# Patient Record
Sex: Female | Born: 1995 | Race: White | Hispanic: No | Marital: Married | Smoking: Never smoker
Health system: Southern US, Community
[De-identification: ages and names within clinical notes are randomized; demographics above are authoritative.]

## PROBLEM LIST (undated history)

## (undated) DIAGNOSIS — E119 Type 2 diabetes mellitus without complications: Secondary | ICD-10-CM

## (undated) DIAGNOSIS — E079 Disorder of thyroid, unspecified: Secondary | ICD-10-CM

## (undated) DIAGNOSIS — E039 Hypothyroidism, unspecified: Secondary | ICD-10-CM

## (undated) DIAGNOSIS — J45909 Unspecified asthma, uncomplicated: Secondary | ICD-10-CM

## (undated) DIAGNOSIS — E282 Polycystic ovarian syndrome: Secondary | ICD-10-CM

## (undated) DIAGNOSIS — N912 Amenorrhea, unspecified: Secondary | ICD-10-CM

## (undated) DIAGNOSIS — I10 Essential (primary) hypertension: Secondary | ICD-10-CM

## (undated) DIAGNOSIS — Z87442 Personal history of urinary calculi: Secondary | ICD-10-CM

## (undated) DIAGNOSIS — F419 Anxiety disorder, unspecified: Secondary | ICD-10-CM

## (undated) DIAGNOSIS — N189 Chronic kidney disease, unspecified: Secondary | ICD-10-CM

## (undated) HISTORY — DX: Disorder of thyroid, unspecified: E07.9

## (undated) HISTORY — DX: Hypothyroidism, unspecified: E03.9

## (undated) HISTORY — DX: Amenorrhea, unspecified: N91.2

## (undated) HISTORY — PX: NO PAST SURGERIES: SHX2092

## (undated) HISTORY — DX: Type 2 diabetes mellitus without complications: E11.9

---

## 2016-05-29 ENCOUNTER — Emergency Department
Admission: EM | Admit: 2016-05-29 | Discharge: 2016-05-29 | Disposition: A | Discharge status: Home / Self Care | Payer: Medicaid Other | Attending: Emergency Medicine | Admitting: Emergency Medicine

## 2016-05-29 ENCOUNTER — Encounter: Payer: Self-pay | Admitting: *Deleted

## 2016-05-29 DIAGNOSIS — N912 Amenorrhea, unspecified: Secondary | ICD-10-CM | POA: Diagnosis not present

## 2016-05-29 DIAGNOSIS — R102 Pelvic and perineal pain: Secondary | ICD-10-CM | POA: Insufficient documentation

## 2016-05-29 DIAGNOSIS — M545 Low back pain: Secondary | ICD-10-CM | POA: Diagnosis present

## 2016-05-29 LAB — URINALYSIS COMPLETE WITH MICROSCOPIC (ARMC ONLY)
Bilirubin Urine: NEGATIVE
GLUCOSE, UA: NEGATIVE mg/dL
Hgb urine dipstick: NEGATIVE
KETONES UR: NEGATIVE mg/dL
NITRITE: NEGATIVE
Protein, ur: NEGATIVE mg/dL
SPECIFIC GRAVITY, URINE: 1.02 (ref 1.005–1.030)
pH: 6 (ref 5.0–8.0)

## 2016-05-29 LAB — COMPREHENSIVE METABOLIC PANEL
ALK PHOS: 76 U/L (ref 38–126)
ALT: 30 U/L (ref 14–54)
AST: 26 U/L (ref 15–41)
Albumin: 4.6 g/dL (ref 3.5–5.0)
Anion gap: 7 (ref 5–15)
BILIRUBIN TOTAL: 0.7 mg/dL (ref 0.3–1.2)
BUN: 11 mg/dL (ref 6–20)
CALCIUM: 9.4 mg/dL (ref 8.9–10.3)
CO2: 29 mmol/L (ref 22–32)
CREATININE: 0.65 mg/dL (ref 0.44–1.00)
Chloride: 103 mmol/L (ref 101–111)
GFR calc non Af Amer: >60 mL/min (ref >60)
Glucose, Bld: 102 mg/dL — ABNORMAL HIGH (ref 65–99)
Potassium: 3.9 mmol/L (ref 3.5–5.1)
SODIUM: 139 mmol/L (ref 135–145)
TOTAL PROTEIN: 8 g/dL (ref 6.5–8.1)

## 2016-05-29 LAB — CBC
HCT: 43.9 % (ref 35.0–47.0)
Hemoglobin: 15.1 g/dL (ref 12.0–16.0)
MCH: 30 pg (ref 26.0–34.0)
MCHC: 34.4 g/dL (ref 32.0–36.0)
MCV: 87.3 fL (ref 80.0–100.0)
PLATELETS: 377 10*3/uL (ref 150–440)
RBC: 5.02 MIL/uL (ref 3.80–5.20)
RDW: 12.3 % (ref 11.5–14.5)
WBC: 9.7 10*3/uL (ref 3.6–11.0)

## 2016-05-29 LAB — WET PREP, GENITAL
CLUE CELLS WET PREP: NONE SEEN
SPERM: NONE SEEN
Trich, Wet Prep: NONE SEEN
WBC WET PREP: NONE SEEN
Yeast Wet Prep HPF POC: NONE SEEN

## 2016-05-29 LAB — LIPASE, BLOOD: Lipase: 22 U/L (ref 11–51)

## 2016-05-29 LAB — POC URINE PREG, ED: Preg Test, Ur: NEGATIVE

## 2016-05-29 NOTE — ED Notes (Signed)
Pt reports lower back pain that radiates into lower abd - pain has been present for a "few days" - pt denies vomiting/diarrhea but reports nausea - pt reports constipation for the last couple of weeks

## 2016-05-29 NOTE — ED Triage Notes (Signed)
Pt arrived to ED reporting lower back pain that is radiating around to lower abd. Pt reports having had nausea for over a week. Pt reports she could possibly be pregnant. She is 2 weeks past due on her period but home pregnancy test was negative. Pt reports she has vomited once in the past week. Pt denies having fevers.

## 2016-05-29 NOTE — ED Provider Notes (Signed)
[facility] Emergency Department Provider Note   ____________________________________________   First MD Initiated Contact with Patient 05/29/16 2100     (approximate)  I have reviewed the triage vital signs and the nursing notes.   HISTORY  Chief Complaint Back Pain and Abdominal Pain   HPI Leslie Allison is a 20 y.o. female without any chronic medical conditions was presenting to the emergency department with low back pain which starts in the middle of her back and radiates around to the front to her lower abdomen. She says this has been going on now for 4-5 days. Says that is worse with movement. Denies any burning with urination. Says she has had one episode of vomiting but does not report any nausea or vomiting at this time. Denies any vaginal bleeding or discharge. Denies any history of sexually transmitted diseases. Is curious because she wanted note she was pregnant or not.She says she is 2 weeks late on her period. She denies any strenuous lifting or bending but does say she spends a lot of time on her feet at her job.   History reviewed. No pertinent past medical history.  There are no active problems to display for this patient.   History reviewed. No pertinent surgical history.  Prior to Admission medications   Not on File    Allergies Amoxicillin  History reviewed. No pertinent family history.  Social History Social History  Substance Use Topics  . Smoking status: Never Smoker  . Smokeless tobacco: Never Used  . Alcohol use No    Review of Systems Constitutional: No fever/chills Eyes: No visual changes. ENT: No sore throat. Cardiovascular: Denies chest pain. Respiratory: Denies shortness of breath. Gastrointestinal:   No diarrhea.  No constipation. Genitourinary: Negative for dysuria. Musculoskeletal: As above Skin: Negative for rash. Neurological: Negative for headaches, focal weakness or numbness.  10-point ROS  otherwise negative.  ____________________________________________   PHYSICAL EXAM:  VITAL SIGNS: ED Triage Vitals  Enc Vitals Group     BP 05/29/16 1946 (!) 141/76     Pulse Rate 05/29/16 1946 91     Resp 05/29/16 1946 16     Temp 05/29/16 1946 98 F (36.7 C)     Temp Source 05/29/16 1946 Oral     SpO2 05/29/16 1946 100 %     Weight 05/29/16 1947 270 lb (122.5 kg)     Height 05/29/16 1947 5\' 2"  (1.575 m)     Head Circumference --      Peak Flow --      Pain Score 05/29/16 1947 10     Pain Loc --      Pain Edu? --      Excl. in GC? --     Constitutional: Alert and oriented. Well appearing and in no acute distress. Eyes: Conjunctivae are normal. PERRL. EOMI. Head: Atraumatic. Nose: No congestion/rhinnorhea. Mouth/Throat: Mucous membranes are moist.   Neck: No stridor.   Cardiovascular: Normal rate, regular rhythm. Grossly normal heart sounds.   Respiratory: Normal respiratory effort.  No retractions. Lungs CTAB. Gastrointestinal: Soft With moderate tenderness across the lower abdomen. No distention. No CVA tenderness. Genitourinary: Exam done with female nurse chaperone. Normal external exam. No lesions. Speculum exam with a very small amount of clear discharge. Bimanual exam without any CMT. Mild uterine as well as bilateral adnexal tenderness without any masses palpated.Musculoskeletal: No lower extremity tenderness nor edema.  No joint effusions. Mild tenderness to the L5-S1 area of the low back. There is no  deformity or step-off. No lateral tenderness to the lumbar area. Neurologic:  Normal speech and language. No gross focal neurologic deficits are appreciated. No gait instability. Skin:  Skin is warm, dry and intact. No rash noted. Psychiatric: Mood and affect are normal. Speech and behavior are normal.  ____________________________________________   LABS (all labs ordered are listed, but only abnormal results are displayed)  Labs Reviewed  COMPREHENSIVE METABOLIC  PANEL - Abnormal; Notable for the following:       Result Value   Glucose, Bld 102 (*)    All other components within normal limits  URINALYSIS COMPLETEWITH MICROSCOPIC ([facility] ONLY) - Abnormal; Notable for the following:    Color, Urine YELLOW (*)    APPearance CLEAR (*)    Leukocytes, UA TRACE (*)    Bacteria, UA RARE (*)    Squamous Epithelial / LPF 0-5 (*)    All other components within normal limits  LIPASE, BLOOD  CBC  HCG, QUANTITATIVE, PREGNANCY  POC URINE PREG, ED   ____________________________________________  EKG   ____________________________________________  RADIOLOGY   ____________________________________________   PROCEDURES  Procedure(s) performed:   Procedures  Critical Care performed:   ____________________________________________   INITIAL IMPRESSION / ASSESSMENT AND PLAN / ED COURSE  Pertinent labs & imaging results that were available during my care of the patient were reviewed by me and considered in my medical decision making (see chart for details).  ----------------------------------------- 11:05 PM on 05/29/2016 -----------------------------------------  Patient is resting comfortably and was able to and bili without any noticeable signs of any discomfort or restriction. She says that she was "freaking out" about being late on her period. She says that she is 2 weeks late and was concerned about being pregnant. After being told she had a negative pregnancy test she says that she feels much better including her abdominal and back pain. She denies any dysuria. I'll send her urine for culture but due to her lack of symptoms I will not treat her with antibiotics at this time. She is understanding of the plan and willing to comply. She'll be given follow-up with OB/GYN.  Clinical Course     ____________________________________________   FINAL CLINICAL IMPRESSION(S) / ED DIAGNOSES  Pelvic pain and female.    NEW MEDICATIONS STARTED  DURING THIS VISIT:  New Prescriptions   No medications on file     Note:  This document was prepared using Dragon voice recognition software and may include unintentional dictation errors.    Myrna Blazeravid Matthew Schaevitz, MD 05/29/16 [phone]

## 2016-05-30 LAB — HCG, QUANTITATIVE, PREGNANCY: HCG, BETA CHAIN, QUANT, S: 1 m[IU]/mL (ref <5)

## 2016-05-30 LAB — CHLAMYDIA/NGC RT PCR (ARMC ONLY)
CHLAMYDIA TR: NOT DETECTED
N GONORRHOEAE: NOT DETECTED

## 2016-06-01 LAB — URINE CULTURE

## 2016-06-02 NOTE — Progress Notes (Signed)
ED Culture Results   Allergies: Amoxicillin Visit Date:8/16 Chief Complaint: Back and abdominal pain Culture Type: Urine Culture Results: 100k CFU Ecoli  Original Abx given: None  Original Abx sensitive, intermediate, or resistant: Recommended ZOX:[id number]:Nitrofurantoin 100 mg PO BID  ED Physician: Len BlalockWilliams Contacted Patient: Yes on phone number [phone] [facility](Home); however patient did not answer phone. Left VM with CB number.  Prescription Called into:   Demetrius Charityeldrin D. Kaden Daughdrill, PharmD, BCPS Clinical Pharmacist

## 2016-06-03 NOTE — Progress Notes (Signed)
ED Culture Results   Allergies: Amoxicillin Visit Date:8/16 Chief Complaint: Back and abdominal pain Culture Type: Urine Culture Results: 100k CFU Ecoli  Original Abx given: None  Original Abx sensitive, intermediate, or resistant: Recommended ZOX:[id number]:Nitrofurantoin 100 mg PO BID  ED Physician: Len BlalockWilliams Contacted Patient: Yes on phone number [phone] [facility](Home); Notified patient of Prescription and culture.  [phone]  Leslie BernKelly m Lorin Gawron, [facility] LtdRPH 06/03/2016 1:06 PM

## 2016-07-18 ENCOUNTER — Encounter: Payer: Self-pay | Admitting: Obstetrics and Gynecology

## 2016-07-18 ENCOUNTER — Ambulatory Visit (INDEPENDENT_AMBULATORY_CARE_PROVIDER_SITE_OTHER): Payer: Medicaid Other | Admitting: Obstetrics and Gynecology

## 2016-07-18 VITALS — BP 118/75 | HR 83 | Ht 64.0 in | Wt 278.1 lb

## 2016-07-18 DIAGNOSIS — F79 Unspecified intellectual disabilities: Secondary | ICD-10-CM | POA: Diagnosis not present

## 2016-07-18 DIAGNOSIS — Z803 Family history of malignant neoplasm of breast: Secondary | ICD-10-CM

## 2016-07-18 DIAGNOSIS — Z68.41 Body mass index (BMI) pediatric, greater than or equal to 95th percentile for age: Secondary | ICD-10-CM | POA: Diagnosis not present

## 2016-07-18 DIAGNOSIS — Z6841 Body Mass Index (BMI) 40.0 and over, adult: Secondary | ICD-10-CM

## 2016-07-18 DIAGNOSIS — N912 Amenorrhea, unspecified: Secondary | ICD-10-CM | POA: Diagnosis not present

## 2016-07-18 LAB — POCT URINE PREGNANCY: Preg Test, Ur: NEGATIVE

## 2016-07-18 MED ORDER — MEDROXYPROGESTERONE ACETATE 10 MG PO TABS: 10.0000 mg | ORAL_TABLET | Freq: Every day | ORAL | 0 refills | Status: DC

## 2016-07-18 NOTE — Progress Notes (Signed)
GYN ENCOUNTER NOTE  Subjective:       Leslie Allison is a 20 y.o. [id number] female is here for gynecologic evaluation of the following issues:  1. Amenorrhea.   2. Desires conception  Patient presents with her partner, both mentally challenged. Amenorrhea for 5 months. No symptoms of pregnancy. Home pregnancy test negative. UPT here negative. Patient has had long history of irregular cycles. She has had significant weight gain in past year-35 pounds.     Gynecologic History Patient's last menstrual period was 02/26/2016 (approximate). Contraception: none Last Pap: N/A Last mammogram: N/A  Obstetric History OB History  Gravida Para Term Preterm AB Living  1 0 0 0 1 0  SAB TAB Ectopic Multiple Live Births  1 0 0 0 0    # Outcome Date GA Lbr Len/2nd Weight Sex Delivery Anes PTL Lv  1 SAB               Past Medical History:  Diagnosis Date  . Amenorrhea     Past Surgical History:  Procedure Laterality Date  . NO PAST SURGERIES      No current outpatient prescriptions on file prior to visit.   No current facility-administered medications on file prior to visit.     Allergies  Allergen Reactions  . Amoxicillin     Social History   Social History  . Marital status: Single    Spouse name: N/A  . Number of children: N/A  . Years of education: N/A   Occupational History  . Not on file.   Social History Main Topics  . Smoking status: Never Smoker  . Smokeless tobacco: Never Used  . Alcohol use No  . Drug use: No  . Sexual activity: Yes    Birth control/ protection: None   Other Topics Concern  . Not on file   Social History Narrative  . No narrative on file    Family History  Problem Relation Age of Onset  . Breast cancer Mother   . Ovarian cancer Neg Hx   . Colon cancer Neg Hx   . Diabetes Neg Hx   . Heart disease Neg Hx     The following portions of the patient's history were reviewed and updated as appropriate: allergies, current  medications, past family history, past medical history, past social history, past surgical history and problem list.  Review of Systems Review of Systems - Negative for history of present illness Review of Systems - General ROS: negative for - chills, fatigue, fever, hot flashes, malaise or night sweats Hematological and Lymphatic ROS: negative for - bleeding problems or swollen lymph nodes Gastrointestinal ROS: negative for - abdominal pain, blood in stools, change in bowel habits and nausea/vomiting Musculoskeletal ROS: negative for - joint pain, muscle pain or muscular weakness Genito-Urinary ROS: negative for - change in menstrual cycle, dysmenorrhea, dyspareunia, dysuria, genital discharge, genital ulcers, hematuria, incontinence, irregular/heavy menses, nocturia or pelvic pain  Objective:   BP 118/75   Pulse 83   Ht 5\' 4"  (1.626 m)   Wt 278 lb 1.6 oz (126.1 kg)   LMP 02/26/2016 (Approximate)   BMI 47.74 kg/m  Physical exam-deferred   Assessment:   1. Amenorrhea, Likely secondary to PCO, obesity 3 - POCT urine pregnancy  2. Morbid obesity with BMI of 45.0-49.9, adult (HCC)  3. Mentally challenged  4. Family history of breast cancer in mother     Plan:   1. UPT negative 2. Provera 10 mg a day for 10  days 3. Return in 3 months for follow-up 4. Healthy eating and exercise with controlled weight loss is encouraged 5. Patient is to start on a multivitamin daily in order to reduce risks for spina bifida in offspring  A total of 30 minutes were spent face-to-face with the patient during the encounter with greater than 50% dealing with counseling and coordination of care.  Herold Harms, MD  Note: This dictation was prepared with Dragon dictation along with smaller phrase technology. Any transcriptional errors that result from this process are unintentional.

## 2016-07-18 NOTE — Patient Instructions (Signed)
1. Start a multivitamin daily 2. Take the Provera medicine 1 tablet a day for 10 days. You will have a cycle 1 week after finishing medicine 3. Return in 3 months for follow-up 4. Start healthy eating and exercise with weight loss

## 2016-08-15 ENCOUNTER — Emergency Department: Payer: Medicaid Other

## 2016-08-15 ENCOUNTER — Emergency Department
Admission: EM | Admit: 2016-08-15 | Discharge: 2016-08-15 | Disposition: A | Discharge status: Home / Self Care | Payer: Medicaid Other | Attending: Emergency Medicine | Admitting: Emergency Medicine

## 2016-08-15 DIAGNOSIS — R0789 Other chest pain: Secondary | ICD-10-CM

## 2016-08-15 DIAGNOSIS — R079 Chest pain, unspecified: Secondary | ICD-10-CM | POA: Diagnosis present

## 2016-08-15 LAB — BASIC METABOLIC PANEL
Anion gap: 8 (ref 5–15)
BUN: 13 mg/dL (ref 6–20)
CO2: 28 mmol/L (ref 22–32)
Calcium: 9.3 mg/dL (ref 8.9–10.3)
Chloride: 103 mmol/L (ref 101–111)
Creatinine, Ser: 0.66 mg/dL (ref 0.44–1.00)
Glucose, Bld: 93 mg/dL (ref 65–99)
POTASSIUM: 4 mmol/L (ref 3.5–5.1)
SODIUM: 139 mmol/L (ref 135–145)

## 2016-08-15 LAB — CBC
HEMATOCRIT: 40.5 % (ref 35.0–47.0)
Hemoglobin: 14.3 g/dL (ref 12.0–16.0)
MCH: 30.4 pg (ref 26.0–34.0)
MCHC: 35.3 g/dL (ref 32.0–36.0)
MCV: 86.1 fL (ref 80.0–100.0)
PLATELETS: 336 10*3/uL (ref 150–440)
RBC: 4.71 MIL/uL (ref 3.80–5.20)
RDW: 12.2 % (ref 11.5–14.5)
WBC: 9.1 10*3/uL (ref 3.6–11.0)

## 2016-08-15 LAB — FIBRIN DERIVATIVES D-DIMER (ARMC ONLY): FIBRIN DERIVATIVES D-DIMER (ARMC): 201 (ref 0–499)

## 2016-08-15 LAB — TROPONIN I: Troponin I: <0.03 ng/mL (ref <0.03)

## 2016-08-15 MED ORDER — IBUPROFEN 400 MG PO TABS
600.0000 mg | ORAL_TABLET | Freq: Once | ORAL | Status: AC
  Administered 2016-08-15: 600 mg via ORAL

## 2016-08-15 MED ORDER — IBUPROFEN 600 MG PO TABS
ORAL_TABLET | ORAL | Status: AC
  Administered 2016-08-15: 600 mg via ORAL
  Filled 2016-08-15: qty 1

## 2016-08-15 NOTE — ED Triage Notes (Signed)
Left sided rib pain, sharp that started approx 1h our PTA. Associated with nausea and diaphoresis. Pt alert and oriented X4, active, cooperative, pt in NAD. RR even and unlabored, color WNL.

## 2016-08-15 NOTE — ED Provider Notes (Signed)
[facility] Emergency Department Provider Note  ____________________________________________  Time seen: Approximately 2:30 PM  I have reviewed the triage vital signs and the nursing notes.   HISTORY  Chief Complaint Chest Pain (rib)   HPI Leslie DialsCarrie Allison is a 20 y.o. female with history of amenorrhea and obesity who presents for evaluation of chest pain. Patient reports that she was at school carrying a tray with her food when she developed sudden onset of left-sided sharp chest pain, pleuritic in nature, 10 out of 10, radiating to her left shoulder, constant. She denies shortness of breath but does endorse that she felt diaphoretic and nauseous with onset of the pain. She denies personal or family history of ischemic heart disease, she is unsure if she has a family history of blood clots, she reports she hasn't been of birth control for 4 years, no recent travel immobilization, no leg pain or swelling, no hemoptysis, no exogenous hormones, no cough, no body aches, no fever or chills. No traums.   Past Medical History:  Diagnosis Date  . Amenorrhea     Patient Active Problem List   Diagnosis Date Noted  . Amenorrhea 07/18/2016  . Morbid obesity with BMI of 45.0-49.9, adult (HCC) 07/18/2016  . Mentally challenged 07/18/2016  . Family history of breast cancer in mother 07/18/2016    Past Surgical History:  Procedure Laterality Date  . NO PAST SURGERIES      Prior to Admission medications   Medication Sig Start Date End Date Taking? Authorizing Provider  medroxyPROGESTERone (PROVERA) 10 MG tablet Take 1 tablet (10 mg total) by mouth daily. 07/18/16   Prentice DockerMartin A Defrancesco, MD    Allergies Amoxicillin  Family History  Problem Relation Age of Onset  . Breast cancer Mother   . Ovarian cancer Neg Hx   . Colon cancer Neg Hx   . Diabetes Neg Hx   . Heart disease Neg Hx     Social History Social History  Substance Use Topics  . Smoking status:  Never Smoker  . Smokeless tobacco: Never Used  . Alcohol use No    Review of Systems  Constitutional: Negative for fever. Eyes: Negative for visual changes. ENT: Negative for sore throat. Cardiovascular: + chest pain. Respiratory: Negative for shortness of breath. Gastrointestinal: Negative for abdominal pain, vomiting or diarrhea. Genitourinary: Negative for dysuria. Musculoskeletal: Negative for back pain. Skin: Negative for rash. Neurological: Negative for headaches, weakness or numbness.  ____________________________________________   PHYSICAL EXAM:  VITAL SIGNS: ED Triage Vitals  Enc Vitals Group     BP 08/15/16 1257 (!) 146/73     Pulse Rate 08/15/16 1257 84     Resp 08/15/16 1257 18     Temp 08/15/16 1257 97.8 F (36.6 C)     Temp Source 08/15/16 1257 Oral     SpO2 08/15/16 1257 97 %     Weight 08/15/16 1257 240 lb (108.9 kg)     Height 08/15/16 1257 5\' 4"  (1.626 m)     Head Circumference --      Peak Flow --      Pain Score 08/15/16 1258 9     Pain Loc --      Pain Edu? --      Excl. in GC? --     Constitutional: Alert and oriented. Well appearing and in no apparent distress. HEENT:      Head: Normocephalic and atraumatic.         Eyes: Conjunctivae are normal. Sclera  is non-icteric. EOMI. PERRL      Mouth/Throat: Mucous membranes are moist.       Neck: Supple with no signs of meningismus. Cardiovascular: Regular rate and rhythm. No murmurs, gallops, or rubs. 2+ symmetrical distal pulses are present in all extremities. No JVD. Respiratory: Normal respiratory effort. Lungs are clear to auscultation bilaterally. No wheezes, crackles, or rhonchi. Patient is tender to palpation over the L lower chest wall with no bruising. Gastrointestinal: Soft, non tender, and non distended with positive bowel sounds. No rebound or guarding. Musculoskeletal: Nontender with normal range of motion in all extremities. No edema, cyanosis, or erythema of extremities. Neurologic:  Normal speech and language. Face is symmetric. Moving all extremities. No gross focal neurologic deficits are appreciated. Skin: Skin is warm, dry and intact. No rash noted. Psychiatric: Mood and affect are normal. Speech and behavior are normal.  ____________________________________________   LABS (all labs ordered are listed, but only abnormal results are displayed)  Labs Reviewed  BASIC METABOLIC PANEL  CBC  TROPONIN I  FIBRIN DERIVATIVES D-DIMER ([facility] ONLY)   ____________________________________________  EKG  ED ECG REPORT I, Nita Sicklearolina Clydene Burack, the attending physician, personally viewed and interpreted this ECG. Normal sinus rhythm, rate of 100, normal intervals, normal axis, diffuse T wave flattening on inferior and lateral leads. No prior for comparison ____________________________________________  RADIOLOGY  CXR: Normal ____________________________________________   PROCEDURES  Procedure(s) performed: None Procedures Critical Care performed:  None ____________________________________________   INITIAL IMPRESSION / ASSESSMENT AND PLAN / ED COURSE  20 y.o. female with history of amenorrhea and obesity who presents for evaluation of sudden onset of severe pleuritic left sided chest pain. Pain most likely MSK reproducible on palpation howevere Patient initially tachycardic to 100 therefore unable to rule out PERC. Will check d-dimer. Low suspicion for cardiac (HEART score 0) or other serious etiology (including aortic dissection, pneumonia, pneumothorax, or pulmonary embolism) based her history and physical exam in the ED today. EKG normal. Plan for labs including CBC, chemistries and troponin, CXR and re-evaluation for disposition. Will give motrin for MSK pain. Will observe patient on cardiac monitor while in the ED and pain control.     Clinical Course   _________________________ 2:50 PM on 08/15/2016 ----------------------------------------- Troponin, d-dimer,  cbc, bmp and CXR all WNL. Patient will be discharged home on supportive care and follow up.  Pertinent labs & imaging results that were available during my care of the patient were reviewed by me and considered in my medical decision making (see chart for details).    ____________________________________________   FINAL CLINICAL IMPRESSION(S) / ED DIAGNOSES  Final diagnoses:  Chest wall pain      NEW MEDICATIONS STARTED DURING THIS VISIT:  New Prescriptions   No medications on file     Note:  This document was prepared using Dragon voice recognition software and may include unintentional dictation errors.    Nita Sicklearolina Fionn Stracke, MD 08/15/16 1450

## 2016-10-09 ENCOUNTER — Emergency Department
Admission: EM | Admit: 2016-10-09 | Discharge: 2016-10-09 | Disposition: A | Discharge status: Home / Self Care | Payer: Medicaid Other | Attending: Emergency Medicine | Admitting: Emergency Medicine

## 2016-10-09 ENCOUNTER — Encounter: Payer: Self-pay | Admitting: Emergency Medicine

## 2016-10-09 DIAGNOSIS — J029 Acute pharyngitis, unspecified: Secondary | ICD-10-CM

## 2016-10-09 DIAGNOSIS — J069 Acute upper respiratory infection, unspecified: Secondary | ICD-10-CM | POA: Insufficient documentation

## 2016-10-09 DIAGNOSIS — B9789 Other viral agents as the cause of diseases classified elsewhere: Secondary | ICD-10-CM

## 2016-10-09 LAB — POCT RAPID STREP A: Streptococcus, Group A Screen (Direct): NEGATIVE

## 2016-10-09 MED ORDER — FIRST-DUKES MOUTHWASH MT SUSP: 10.0000 mL | Freq: Four times a day (QID) | OROMUCOSAL | 0 refills | Status: DC

## 2016-10-09 MED ORDER — PSEUDOEPH-BROMPHEN-DM 30-2-10 MG/5ML PO SYRP: 5.0000 mL | ORAL_SOLUTION | Freq: Four times a day (QID) | ORAL | 0 refills | Status: DC | PRN

## 2016-10-09 NOTE — ED Notes (Signed)
See triage note  States she developed sore throat couple of dys ago  Increased pain with swallowing this am

## 2016-10-09 NOTE — ED Provider Notes (Signed)
[facility] Emergency Department Provider Note   ____________________________________________   First MD Initiated Contact with Patient 10/09/16 0825     (approximate)  I have reviewed the triage vital signs and the nursing notes.   HISTORY  Chief Complaint Sore Throat    HPI Leslie Allison is a 20 y.o. female patient complaining of sore throat  sore this morning. Patient denies any fever associated this complaint. Patient also states  has a cough which started yesterday. Patient denies nausea vomiting or diarrhea. No palliative measures taken for this complaint. Patient rates the pain as a 5/10. Patient described a pain as "achy".   Past Medical History:  Diagnosis Date  . Amenorrhea     Patient Active Problem List   Diagnosis Date Noted  . Amenorrhea 07/18/2016  . Morbid obesity with BMI of 45.0-49.9, adult (HCC) 07/18/2016  . Mentally challenged 07/18/2016  . Family history of breast cancer in mother 07/18/2016    Past Surgical History:  Procedure Laterality Date  . NO PAST SURGERIES      Prior to Admission medications   Medication Sig Start Date End Date Taking? Authorizing Provider  brompheniramine-pseudoephedrine-DM 30-2-10 MG/5ML syrup Take 5 mLs by mouth 4 (four) times daily as needed. 10/09/16   Joni Reiningonald K Smith, PA-C  Diphenhyd-Hydrocort-Nystatin (FIRST-DUKES MOUTHWASH) SUSP Use as directed 10 mLs in the mouth or throat 4 (four) times daily. 10/09/16   Joni Reiningonald K Smith, PA-C  medroxyPROGESTERone (PROVERA) 10 MG tablet Take 1 tablet (10 mg total) by mouth daily. 07/18/16   Prentice DockerMartin A Defrancesco, MD    Allergies Amoxicillin  Family History  Problem Relation Age of Onset  . Breast cancer Mother   . Ovarian cancer Neg Hx   . Colon cancer Neg Hx   . Diabetes Neg Hx   . Heart disease Neg Hx     Social History Social History  Substance Use Topics  . Smoking status: Never Smoker  . Smokeless tobacco: Never Used  . Alcohol use No      Review of Systems Constitutional: No fever/chills Eyes: No visual changes. ENT: Sore throat Cardiovascular: Denies chest pain. Respiratory: Denies shortness of breath. Nonproductive cough Gastrointestinal: No abdominal pain.  No nausea, no vomiting.  No diarrhea.  No constipation. Genitourinary: Negative for dysuria. Musculoskeletal: Negative for back pain. Skin: Negative for rash. Neurological: Negative for headaches, focal weakness or numbness. Allergic/Immunilogical: Amoxil __________________________________________   PHYSICAL EXAM:  VITAL SIGNS: ED Triage Vitals  Enc Vitals Group     BP 10/09/16 0756 124/67     Pulse Rate 10/09/16 0756 81     Resp 10/09/16 0756 20     Temp 10/09/16 0756 98 F (36.7 C)     Temp Source 10/09/16 0756 Oral     SpO2 10/09/16 0756 100 %     Weight 10/09/16 0757 240 lb (108.9 kg)     Height --      Head Circumference --      Peak Flow --      Pain Score 10/09/16 0757 5     Pain Loc --      Pain Edu? --      Excl. in GC? --     Constitutional: Alert and oriented. Well appearing and in no acute distress. Eyes: Conjunctivae are normal. PERRL. EOMI. Head: Atraumatic. Nose: No congestion/rhinnorhea. Mouth/Throat: Mucous membranes are moist.  Oropharynx -erythematous. Neck: No stridor.  No cervical spine tenderness to palpation. Hematological/Lymphatic/Immunilogical: No cervical lymphadenopathy. Cardiovascular: Normal rate, regular  rhythm. Grossly normal heart sounds.  Good peripheral circulation. Respiratory: Normal respiratory effort.  No retractions. Lungs CTAB.Nonproductive cough with deep inspirations Gastrointestinal: Soft and nontender. No distention. No abdominal bruits. No CVA tenderness. Musculoskeletal: No lower extremity tenderness nor edema.  No joint effusions. Neurologic:  Normal speech and language. No gross focal neurologic deficits are appreciated. No gait instability. Skin:  Skin is warm, dry and intact. No rash  noted. Psychiatric: Mood and affect are normal. Speech and behavior are normal.  ____________________________________________   LABS (all labs ordered are listed, but only abnormal results are displayed)  Labs Reviewed  CULTURE, GROUP A STREP [facility])  POCT RAPID STREP A   ____________________________________________  EKG   ____________________________________________  RADIOLOGY   ____________________________________________   PROCEDURES  Procedure(s) performed: None  Procedures  Critical Care performed: No  ____________________________________________   INITIAL IMPRESSION / ASSESSMENT AND PLAN / ED COURSE  Pertinent labs & imaging results that were available during my care of the patient were reviewed by me and considered in my medical decision making (see chart for details).  Sore throat with viral respiratory infection. Patient given discharge Instructions. Patient given a prescription would [facility] mouthwash and Bromfed-DM. Patient advised follow-up family doctor condition persists.  Clinical Course    Discussed negative rapid strep test. Patient advised culture is pending.  ____________________________________________   FINAL CLINICAL IMPRESSION(S) / ED DIAGNOSES  Final diagnoses:  Viral URI with cough  Sore throat      NEW MEDICATIONS STARTED DURING THIS VISIT:  New Prescriptions   BROMPHENIRAMINE-PSEUDOEPHEDRINE-DM 30-2-10 MG/5ML SYRUP    Take 5 mLs by mouth 4 (four) times daily as needed.   DIPHENHYD-HYDROCORT-NYSTATIN (FIRST-DUKES MOUTHWASH) SUSP    Use as directed 10 mLs in the mouth or throat 4 (four) times daily.     Note:  This document was prepared using Dragon voice recognition software and may include unintentional dictation errors.    Joni Reiningonald K Smith, PA-C 10/09/16 [id number]    Phineas SemenGraydon Goodman, MD 10/09/16 [phone]

## 2016-10-09 NOTE — ED Triage Notes (Signed)
Pt to ed with c/o sore throat that started this am.  Pt denies fever, reports cough yesterday.

## 2016-10-11 LAB — CULTURE, GROUP A STREP (THRC)

## 2016-10-13 ENCOUNTER — Encounter: Payer: Self-pay | Admitting: Emergency Medicine

## 2016-10-13 ENCOUNTER — Emergency Department
Admission: EM | Admit: 2016-10-13 | Discharge: 2016-10-13 | Disposition: A | Discharge status: Home / Self Care | Payer: Medicaid Other | Attending: Emergency Medicine | Admitting: Emergency Medicine

## 2016-10-13 DIAGNOSIS — R509 Fever, unspecified: Secondary | ICD-10-CM | POA: Diagnosis present

## 2016-10-13 DIAGNOSIS — B349 Viral infection, unspecified: Secondary | ICD-10-CM | POA: Insufficient documentation

## 2016-10-13 DIAGNOSIS — Z79899 Other long term (current) drug therapy: Secondary | ICD-10-CM | POA: Diagnosis not present

## 2016-10-13 LAB — POCT RAPID STREP A: Streptococcus, Group A Screen (Direct): NEGATIVE

## 2016-10-13 MED ORDER — ONDANSETRON 8 MG PO TBDP
8.0000 mg | ORAL_TABLET | Freq: Once | ORAL | Status: AC
  Administered 2016-10-13: 8 mg via ORAL
  Filled 2016-10-13: qty 1

## 2016-10-13 MED ORDER — ONDANSETRON 4 MG PO TBDP: 4.0000 mg | ORAL_TABLET | Freq: Three times a day (TID) | ORAL | 0 refills | Status: DC | PRN

## 2016-10-13 NOTE — ED Provider Notes (Signed)
[facility] Emergency Department Provider Note   ____________________________________________   First MD Initiated Contact with Patient 10/13/16 [phone]     (approximate)  I have reviewed the triage vital signs and the nursing notes.   HISTORY  Chief Complaint Fever   HPI Leslie Allison is a 20 y.o. female is here with complaint of fever. Patient states she took Tylenol at home prior to arrival in the emergency room. She states that there was a lot of vomiting this morning. She denies any diarrhea. She states the highest her temperature is been his 102.9 and then dropped to 101.9. Patient complains of sore throat and coughing this morning as well. Patient denies any pain at this time.   Past Medical History:  Diagnosis Date  . Amenorrhea     Patient Active Problem List   Diagnosis Date Noted  . Amenorrhea 07/18/2016  . Morbid obesity with BMI of 45.0-49.9, adult (HCC) 07/18/2016  . Mentally challenged 07/18/2016  . Family history of breast cancer in mother 07/18/2016    Past Surgical History:  Procedure Laterality Date  . NO PAST SURGERIES      Prior to Admission medications   Medication Sig Start Date End Date Taking? Authorizing Provider  brompheniramine-pseudoephedrine-DM 30-2-10 MG/5ML syrup Take 5 mLs by mouth 4 (four) times daily as needed. 10/09/16   Joni Reiningonald K Smith, PA-C  Diphenhyd-Hydrocort-Nystatin (FIRST-DUKES MOUTHWASH) SUSP Use as directed 10 mLs in the mouth or throat 4 (four) times daily. 10/09/16   Joni Reiningonald K Smith, PA-C  medroxyPROGESTERone (PROVERA) 10 MG tablet Take 1 tablet (10 mg total) by mouth daily. 07/18/16   Prentice DockerMartin A Defrancesco, MD  ondansetron (ZOFRAN ODT) 4 MG disintegrating tablet Take 1 tablet (4 mg total) by mouth every 8 (eight) hours as needed for nausea or vomiting. 10/13/16   Tommi Rumpshonda L Chiamaka Latka, PA-C    Allergies Amoxicillin  Family History  Problem Relation Age of Onset  . Breast cancer Mother   . Ovarian  cancer Neg Hx   . Colon cancer Neg Hx   . Diabetes Neg Hx   . Heart disease Neg Hx     Social History Social History  Substance Use Topics  . Smoking status: Never Smoker  . Smokeless tobacco: Never Used  . Alcohol use No    Review of Systems Constitutional: Positive fever/chills Eyes: No visual changes. ENT: Positive sore throat. Cardiovascular: Denies chest pain. Respiratory: Denies shortness of breath. Positive coughing. Gastrointestinal: No abdominal pain.  Positive nausea, positive vomiting.  No diarrhea.   Genitourinary: Negative for dysuria. Musculoskeletal: Negative for back pain. Skin: Negative for rash. Neurological: Negative for headaches, focal weakness or numbness.  10-point ROS otherwise negative.  ____________________________________________   PHYSICAL EXAM:  VITAL SIGNS: ED Triage Vitals  Enc Vitals Group     BP 10/13/16 0909 128/77     Pulse Rate 10/13/16 0909 (!) 113     Resp 10/13/16 0909 16     Temp 10/13/16 0909 99.6 F (37.6 C)     Temp Source 10/13/16 0909 Oral     SpO2 10/13/16 0909 96 %     Weight 10/13/16 0910 240 lb (108.9 kg)     Height --      Head Circumference --      Peak Flow --      Pain Score 10/13/16 0910 0     Pain Loc --      Pain Edu? --      Excl. in GC? --  Constitutional: Alert and oriented. Well appearing and in no acute distress. Eyes: Conjunctivae are normal. PERRL. EOMI. Head: Atraumatic. Nose: Mild congestion/no rhinnorhea.  EACs and TMs are clear bilaterally. Mouth/Throat: Mucous membranes are moist.  Oropharynx non-erythematous. No exudate is noted and patient is not having any difficulty swallowing her own saliva.  Neck: No stridor.   Hematological/Lymphatic/Immunilogical: Minimal cervical lymphadenopathy. Cardiovascular: Normal rate, regular rhythm. Grossly normal heart sounds.  Good peripheral circulation. Respiratory: Normal respiratory effort.  No retractions. Lungs CTAB. Gastrointestinal: Soft and  nontender. No distention. No abdominal bruits. No CVA tenderness. Musculoskeletal: Moves upper and lower extremities without any difficulty. Normal gait was noted. Neurologic:  Normal speech and language. No gross focal neurologic deficits are appreciated. No gait instability. Skin:  Skin is warm, dry and intact. No rash noted. Psychiatric: Mood and affect are normal. Speech and behavior are normal.  ____________________________________________   LABS (all labs ordered are listed, but only abnormal results are displayed)  Labs Reviewed  POCT RAPID STREP A    PROCEDURES  Procedure(s) performed: None  Procedures  Critical Care performed: No  ____________________________________________   INITIAL IMPRESSION / ASSESSMENT AND PLAN / ED COURSE  Pertinent labs & imaging results that were available during my care of the patient were reviewed by me and considered in my medical decision making (see chart for details).    Clinical Course    Patient was notified that strep test was negative. Patient was given Zofran while in the emergency room there was no continued vomiting. Patient tolerated fluids well. Patient is continued drinking fluids and was given a prescription for Zofran if needed for nausea and vomiting. She [facility] if any continued problems.  ____________________________________________   FINAL CLINICAL IMPRESSION(S) / ED DIAGNOSES  Final diagnoses:  Viral illness      NEW MEDICATIONS STARTED DURING THIS VISIT:  Discharge Medication List as of 10/13/2016 12:34 PM    START taking these medications   Details  ondansetron (ZOFRAN ODT) 4 MG disintegrating tablet Take 1 tablet (4 mg total) by mouth every 8 (eight) hours as needed for nausea or vomiting., Starting Sun 10/13/2016, Print         Note:  This document was prepared using Dragon voice recognition software and may include unintentional dictation errors.    Tommi Rumpshonda L  Makenze Ellett, PA-C 10/13/16 1630    Jene Everyobert Kinner, MD 10/15/16 [phone]

## 2016-10-13 NOTE — ED Triage Notes (Signed)
Patient from home via POV with c/o fever. Patient self administered tylenol at home. Vomiting last night, however none noted this morning. No coughing noted in triage until patient was specifically asked about cough.

## 2016-10-13 NOTE — Discharge Instructions (Signed)
Clear fluids for the next 24 hours. Use Zofran as needed for nausea or vomiting. Tylenol if needed for fever or body aches. [facility] if any continued problems.

## 2016-10-13 NOTE — ED Notes (Signed)
Pt presents to ED with c/o fever. Pt's SO states highest fever of 102.9, then fever dropped to 101.9, on arrival pt's temp 99.6. Pt c/o sore throat and cough this morning, states has been taking OTC tylenol and Ibuprofen for fever. Pt's SO reports 1 episode of "alot of vomiting this morning". Pt is alert and oriented, NAD noted at this time.

## 2016-10-22 ENCOUNTER — Ambulatory Visit: Payer: Medicaid Other | Admitting: Obstetrics and Gynecology

## 2016-11-20 ENCOUNTER — Ambulatory Visit (INDEPENDENT_AMBULATORY_CARE_PROVIDER_SITE_OTHER): Payer: Medicaid Other | Admitting: Obstetrics and Gynecology

## 2016-11-20 ENCOUNTER — Other Ambulatory Visit: Payer: Self-pay | Admitting: Obstetrics and Gynecology

## 2016-11-20 VITALS — BP 112/76 | HR 81 | Ht 64.0 in | Wt 287.2 lb

## 2016-11-20 DIAGNOSIS — R102 Pelvic and perineal pain: Secondary | ICD-10-CM | POA: Diagnosis not present

## 2016-11-20 DIAGNOSIS — N898 Other specified noninflammatory disorders of vagina: Secondary | ICD-10-CM

## 2016-11-20 DIAGNOSIS — N926 Irregular menstruation, unspecified: Secondary | ICD-10-CM

## 2016-11-20 DIAGNOSIS — Z68.41 Body mass index (BMI) pediatric, greater than or equal to 95th percentile for age: Secondary | ICD-10-CM | POA: Diagnosis not present

## 2016-11-20 DIAGNOSIS — Z6841 Body Mass Index (BMI) 40.0 and over, adult: Secondary | ICD-10-CM

## 2016-11-20 NOTE — Progress Notes (Signed)
GYN ENCOUNTER NOTE  Subjective:       Leslie Allison is a 21 y.o. [id number] female is here for gynecologic evaluation of the following issues:  1. Black discharge: pt. c/o black discharge with fishy odor X3 days. Took Provera pill on December 5th. Ensuing painful premenstrual sx of low back pain (that radiates similar to sciatica), tender breasts, abdominal pain & also fatigue. Pt. reports a heavy menses lasted 10/28/16 -11/18/16 (tampon q2 hours). C/o chills & hot flashes this AM.   2. Conception update- been trying for two years. Engaged and getting married this year.  Gynecologic History Patient's last menstrual period was 10/28/2016 (exact date). Contraception: none Hoping to [place] Last Pap: n/a Results were: n/a Last mammogram: n/a, Results were: n/a  Obstetric History OB History  Gravida Para Term Preterm AB Living  1 0 0 0 1 0  SAB TAB Ectopic Multiple Live Births  1 0 0 0 0    # Outcome Date GA Lbr Len/2nd Weight Sex Delivery Anes PTL Lv  1 SAB               Past Medical History:  Diagnosis Date  . Amenorrhea     Past Surgical History:  Procedure Laterality Date  . NO PAST SURGERIES      No current outpatient prescriptions on file prior to visit.   No current facility-administered medications on file prior to visit.     Allergies  Allergen Reactions  . Amoxicillin     Social History   Social History  . Marital status: Single    Spouse name: N/A  . Number of children: N/A  . Years of education: N/A   Occupational History  . Not on file.   Social History Main Topics  . Smoking status: Never Smoker  . Smokeless tobacco: Never Used  . Alcohol use No  . Drug use: No  . Sexual activity: Yes    Birth control/ protection: None   Other Topics Concern  . Not on file   Social History Narrative  . No narrative on file    Family History  Problem Relation Age of Onset  . Breast cancer Mother   . Ovarian cancer Neg Hx   . Colon cancer Neg Hx   .  Diabetes Neg Hx   . Heart disease Neg Hx     The following portions of the patient's history were reviewed and updated as appropriate: allergies, current medications, past family history, past medical history, past social history, past surgical history and problem list.  Review of Systems Review of Systems -  Review of Systems - General ROS:(-) fever or night sweats (+) malaise, fatigue, chills, hot flashes Hematological and Lymphatic ROS: (-) bleeding problems or swollen lymph nodes Gastrointestinal ROS: (+) abdominal pain, (-) blood in stools,(-) change in bowel habits and nausea/vomiting Musculoskeletal ROS: (+) pain that runs from lower back down into foot, similar to sciatic pain path (-) joint pain, muscle pain or muscular weakness Genito-Urinary ROS: negative for (+) dysuria (-) dyspareunia, dysuria, (+) genital discharge, (-) genital ulcers, (-) hematuria, (-) incontinence, (+) irregular/heavy menses, (-) nocturia (+) pelvic pain  Objective:   BP 112/76   Pulse 81   Ht 5\' 4"  (1.626 m)   Wt 287 lb 3.2 oz (130.3 kg)   LMP 10/28/2016 (Exact Date)   BMI 49.30 kg/m  CONSTITUTIONAL: Well-developed, well-nourished female in no acute distress.  HENT:  Normocephalic, atraumatic.  NECK: Normal range of motion, supple, no masses.  Normal thyroid.  SKIN: Skin is warm and dry. No rash noted. Not diaphoretic. No erythema. No pallor. NEUROLGIC: Alert and oriented to person, place, and time. PSYCHIATRIC: Normal mood and affect. Normal behavior. Marginal judgment and thought content. CARDIOVASCULAR:rrr RESPIRATORY: LCAB BREASTS: Not Examined ABDOMEN: Soft, non distended. No Organomegaly. Tender in LRQ and upper left quadrant. No acute peritoneal signs PELVIC:  External Genitalia: Normal  BUS: Normal  Vagina: Normal  Cervix: Normal. White discharge; no lesions  Uterus: Normal size, shape,consistency, mobile. Midline uterus.   Adnexa: Right adnexal fullness, 2/4 tender, without obvious  mass  RV: External exam normal  Bladder: Nontender MUSCULOSKELETAL: Normal range of motion. No tenderness.  No cyanosis, clubbing, or edema.  PROCEDURE: Wet prep Normal saline-no white blood cells; no clue cells; no Trichomonas: Normal epithelial cells KOH-no yeast; whiff test negative   Assessment:   1. Vaginal discharge - white.  2. Wet prep: negative saline, KOH, whiff test 3. Possible right adnexal mass  4. Morbid obesity 5. Pelvic pain, right lower quadrant    Plan:  1. Ultrasound to assess the ovaries. 2. RTC in two weeks  3. Tylenol and ibuprofen when necessary 4. Nu swab  A total of 15 minutes were spent face-to-face with the patient during this encounter and over half of that time dealt with counseling and coordination of care.   Marisue Ivan, PA-S Herold Harms, MD   I have seen, interviewed, and examined the patient in conjunction with the [facility].A. student and affirm the diagnosis and management plan. Nell Gales A. Xavion Muscat, MD, FACOG   Note: This dictation was prepared with Dragon dictation along with smaller phrase technology. Any transcriptional errors that result from this process are unintentional.

## 2016-11-20 NOTE — Patient Instructions (Signed)
1. Ultrasound is scheduled to assess the ovaries 2. Nu swab test is done to assess for vaginitis 3. Return in 2 weeks for follow-up on ultrasound

## 2016-11-21 DIAGNOSIS — N898 Other specified noninflammatory disorders of vagina: Secondary | ICD-10-CM | POA: Insufficient documentation

## 2016-11-22 ENCOUNTER — Ambulatory Visit (INDEPENDENT_AMBULATORY_CARE_PROVIDER_SITE_OTHER): Payer: Medicaid Other

## 2016-11-22 DIAGNOSIS — N926 Irregular menstruation, unspecified: Secondary | ICD-10-CM | POA: Diagnosis not present

## 2016-11-22 LAB — NUSWAB VAGINITIS PLUS (VG+)
Atopobium vaginae: HIGH Score — AB
CHLAMYDIA TRACHOMATIS, NAA: NEGATIVE
Candida albicans, NAA: POSITIVE — AB
Candida glabrata, NAA: NEGATIVE
Neisseria gonorrhoeae, NAA: NEGATIVE
TRICH VAG BY NAA: NEGATIVE

## 2016-11-25 ENCOUNTER — Telehealth: Payer: Self-pay | Admitting: Obstetrics and Gynecology

## 2016-11-25 ENCOUNTER — Telehealth: Payer: Self-pay

## 2016-11-25 MED ORDER — TERCONAZOLE 0.4 % VA CREA: 1.0000 | TOPICAL_CREAM | Freq: Every day | VAGINAL | 0 refills | Status: DC

## 2016-11-25 NOTE — Telephone Encounter (Signed)
-----   Message from Herold HarmsMartin A Defrancesco, MD sent at 11/25/2016  9:45 AM EST ----- Please notify - Abnormal Labs Needs terazol 7 cream daily x 7 days

## 2016-11-25 NOTE — Telephone Encounter (Signed)
Pt came in and she is shaking and wanted to be seen, however I told her we do not do walk in and dr de is not in the office he is in surgery, so sh would like a call back about her shaking.

## 2016-11-25 NOTE — Telephone Encounter (Signed)
Pt aware. Nuswab pos for yeast. Med erx. Pt aware to use at bedtime for 7 nights. Advised no IC until meds are complete.

## 2016-11-26 NOTE — Telephone Encounter (Signed)
Lmtrc

## 2016-11-28 NOTE — Telephone Encounter (Signed)
LMTRC

## 2016-12-02 NOTE — Telephone Encounter (Signed)
Tried to contact pt x 3. LM x 2. Chart closed.

## 2016-12-10 ENCOUNTER — Encounter: Payer: Self-pay | Admitting: Obstetrics and Gynecology

## 2016-12-10 ENCOUNTER — Ambulatory Visit (INDEPENDENT_AMBULATORY_CARE_PROVIDER_SITE_OTHER): Payer: Medicaid Other | Admitting: Obstetrics and Gynecology

## 2016-12-10 VITALS — BP 125/81 | HR 96 | Ht 64.0 in | Wt 284.6 lb

## 2016-12-10 DIAGNOSIS — Z68.41 Body mass index (BMI) pediatric, greater than or equal to 95th percentile for age: Secondary | ICD-10-CM

## 2016-12-10 DIAGNOSIS — N926 Irregular menstruation, unspecified: Secondary | ICD-10-CM | POA: Diagnosis not present

## 2016-12-10 DIAGNOSIS — E282 Polycystic ovarian syndrome: Secondary | ICD-10-CM

## 2016-12-10 DIAGNOSIS — Z6841 Body Mass Index (BMI) 40.0 and over, adult: Secondary | ICD-10-CM

## 2016-12-10 LAB — POCT URINE PREGNANCY: PREG TEST UR: NEGATIVE

## 2016-12-10 MED ORDER — NORETHINDRONE ACET-ETHINYL EST 1-20 MG-MCG PO TABS: 1.0000 | ORAL_TABLET | Freq: Every day | ORAL | 11 refills | Status: DC

## 2016-12-10 MED ORDER — MEDROXYPROGESTERONE ACETATE 10 MG PO TABS: 10.0000 mg | ORAL_TABLET | Freq: Every day | ORAL | 0 refills | Status: DC

## 2016-12-10 NOTE — Progress Notes (Signed)
Chief complaint: 1. Follow-up on ultrasound 2. Oligomenorrhea 3. Dysmenorrhea 4. Morbid obesity  Patient presents for follow-up on pelvic ultrasound. Ultrasound demonstrated normal findings with the exception of the ovaries which showed findings consistent with polycystic ovary disease.  Patient has been given information regarding polycystic ovary disease. She understands that in order to regulate her cycles, make them less painful as well as less heavy, birth control pills will be prescribed. She also understands that losing weight will hopefully facilitate return of normal ovarian function.  ASSESSMENT: 1. PCO; ultrasound supports diagnosis 2. Oligomenorrhea 3. Dysmenorrhea 4. Morbid obesity  PLAN: 1. UPT 2. Provera 10 mg a day for 10 days 3. Begin combined oral contraceptive following onset of menses 4. Maintain menstrual calendar monitoring to assess bleeding 5. Return in 3 months for follow-up  A total of 15 minutes were spent face-to-face with the patient during this encounter and over half of that time dealt with counseling and coordination of care.  Herold HarmsMartin A Allessandra Bernardi, MD  Note: This dictation was prepared with Dragon dictation along with smaller phrase technology. Any transcriptional errors that result from this process are unintentional.

## 2016-12-10 NOTE — Patient Instructions (Signed)
1. Urine pregnancy test is negative today 2. Take Provera 10 mg a day for 10 days 3. When your period starts, begin taking birth control pills 4. Keep track of your menstrual bleeding with menstrual calendar diary 5. Return in 3 months for follow-up

## 2017-03-11 ENCOUNTER — Encounter: Payer: Medicaid Other | Admitting: Obstetrics and Gynecology

## 2017-08-16 ENCOUNTER — Emergency Department
Admission: EM | Admit: 2017-08-16 | Discharge: 2017-08-16 | Disposition: A | Discharge status: Home / Self Care | Payer: Medicaid Other | Attending: Emergency Medicine | Admitting: Emergency Medicine

## 2017-08-16 ENCOUNTER — Encounter: Payer: Self-pay | Admitting: Emergency Medicine

## 2017-08-16 DIAGNOSIS — R05 Cough: Secondary | ICD-10-CM | POA: Insufficient documentation

## 2017-08-16 DIAGNOSIS — R059 Cough, unspecified: Secondary | ICD-10-CM

## 2017-08-16 DIAGNOSIS — Z79899 Other long term (current) drug therapy: Secondary | ICD-10-CM | POA: Insufficient documentation

## 2017-08-16 DIAGNOSIS — F99 Mental disorder, not otherwise specified: Secondary | ICD-10-CM | POA: Diagnosis not present

## 2017-08-16 DIAGNOSIS — N393 Stress incontinence (female) (male): Secondary | ICD-10-CM | POA: Insufficient documentation

## 2017-08-16 DIAGNOSIS — R300 Dysuria: Secondary | ICD-10-CM | POA: Diagnosis present

## 2017-08-16 LAB — PREGNANCY, URINE: Preg Test, Ur: NEGATIVE

## 2017-08-16 LAB — URINALYSIS, COMPLETE (UACMP) WITH MICROSCOPIC
BACTERIA UA: NONE SEEN
BILIRUBIN URINE: NEGATIVE
GLUCOSE, UA: NEGATIVE mg/dL
Hgb urine dipstick: NEGATIVE
KETONES UR: NEGATIVE mg/dL
LEUKOCYTES UA: NEGATIVE
Nitrite: NEGATIVE
PH: 6 (ref 5.0–8.0)
PROTEIN: NEGATIVE mg/dL
RBC / HPF: NONE SEEN RBC/hpf (ref 0–5)
Specific Gravity, Urine: 1.002 — ABNORMAL LOW (ref 1.005–1.030)

## 2017-08-16 LAB — GLUCOSE, CAPILLARY: Glucose-Capillary: 105 mg/dL — ABNORMAL HIGH (ref 65–99)

## 2017-08-16 MED ORDER — METHYLPREDNISOLONE SODIUM SUCC 125 MG IJ SOLR
125.0000 mg | Freq: Once | INTRAMUSCULAR | Status: AC
  Administered 2017-08-16: 125 mg via INTRAVENOUS
  Filled 2017-08-16: qty 2

## 2017-08-16 MED ORDER — PREDNISONE 50 MG PO TABS: ORAL_TABLET | ORAL | 0 refills | Status: DC

## 2017-08-16 MED ORDER — BENZONATATE 100 MG PO CAPS: 100.0000 mg | ORAL_CAPSULE | Freq: Three times a day (TID) | ORAL | 0 refills | Status: AC | PRN

## 2017-08-16 NOTE — ED Notes (Signed)
Pt st. They cannot pee at this time. Pt given water to help produce a urine sample with RN permission

## 2017-08-16 NOTE — ED Notes (Signed)

## 2017-08-16 NOTE — ED Provider Notes (Signed)
[facility] Emergency Department Provider Note  ____________________________________________  Time seen: Approximately 8:44 PM  I have reviewed the triage vital signs and the nursing notes.   HISTORY  Chief Complaint Dysuria    HPI Leslie Allison is a 21 y.o. female presents to the emergency department with increased urinary frequency but denies dysuria and hematuria.  No flank pain.  No fever.  Patient has a nonproductive cough,  Also for the past 4 days.  Patient reports that she experiences increased urinary frequency when she is coughing.Patient states that she has been "spitting more today".  But has experienced no nausea or vomiting.  Patient reports that she has a history of urinary tract infections and her last UTI was approximately 2 months ago.  No changes in vaginal discharge or dyspareunia.  Patient is unsure whether or not she is pregnant.  No alleviating measures have been attempted.   Past Medical History:  Diagnosis Date  . Amenorrhea     Patient Active Problem List   Diagnosis Date Noted  . PCO (polycystic ovaries) 12/10/2016  . Vaginal discharge 11/21/2016  . Amenorrhea 07/18/2016  . Morbid obesity with BMI of 45.0-49.9, adult (HCC) 07/18/2016  . Mentally challenged 07/18/2016  . Family history of breast cancer in mother 07/18/2016    Past Surgical History:  Procedure Laterality Date  . NO PAST SURGERIES      Prior to Admission medications   Medication Sig Start Date End Date Taking? Authorizing Provider  benzonatate (TESSALON PERLES) 100 MG capsule Take 1 capsule (100 mg total) by mouth 3 (three) times daily as needed for cough. 08/16/17 08/23/17  Orvil Feil, PA-C  medroxyPROGESTERone (PROVERA) 10 MG tablet Take 1 tablet (10 mg total) by mouth daily. 12/10/16   Defrancesco, Prentice Docker, MD  norethindrone-ethinyl estradiol (MICROGESTIN) 1-20 MG-MCG tablet Take 1 tablet by mouth daily. 12/10/16   Defrancesco, Prentice Docker, MD  predniSONE  (DELTASONE) 50 MG tablet Take one 50 mg tablet once daily for the next five days. 08/16/17   Orvil Feil, PA-C    Allergies Amoxicillin  Family History  Problem Relation Age of Onset  . Breast cancer Mother   . Ovarian cancer Neg Hx   . Colon cancer Neg Hx   . Diabetes Neg Hx   . Heart disease Neg Hx     Social History Social History  Substance Use Topics  . Smoking status: Never Smoker  . Smokeless tobacco: Never Used  . Alcohol use No     Review of Systems  Constitutional: No fever/chills Eyes: No visual changes. No discharge ENT: No upper respiratory complaints. Cardiovascular: no chest pain. Respiratory: no cough. No SOB. Gastrointestinal: No abdominal pain.  No nausea, no vomiting.  No diarrhea.  No constipation. Genitourinary: Patient has increased urinary frequency. No hematuria Musculoskeletal: Negative for musculoskeletal pain. Skin: Negative for rash, abrasions, lacerations, ecchymosis. Neurological: Negative for headaches, focal weakness or numbness.   ____________________________________________   PHYSICAL EXAM:  VITAL SIGNS: ED Triage Vitals  Enc Vitals Group     BP 08/16/17 1831 (!) 159/89     Pulse Rate 08/16/17 1831 (!) 103     Resp 08/16/17 1831 18     Temp 08/16/17 1831 98.9 F (37.2 C)     Temp Source 08/16/17 1831 Oral     SpO2 08/16/17 1831 100 %     Weight 08/16/17 1833 252 lb (114.3 kg)     Height 08/16/17 1833 5\' 2"  (1.575 m)  Head Circumference --      Peak Flow --      Pain Score --      Pain Loc --      Pain Edu? --      Excl. in GC? --      Constitutional: Alert and oriented. Well appearing and in no acute distress. Eyes: Conjunctivae are normal. PERRL. EOMI. Head: Atraumatic. Cardiovascular: Normal rate, regular rhythm. Normal S1 and S2.  Good peripheral circulation. Respiratory: Normal respiratory effort without tachypnea or retractions. Lungs CTAB. Good air entry to the bases with no decreased or absent breath  sounds. Gastrointestinal: Bowel sounds 4 quadrants. Soft and nontender to palpation. No guarding or rigidity. No palpable masses. No distention. No CVA tenderness. Musculoskeletal: Full range of motion to all extremities. No gross deformities appreciated. Neurologic:  Normal speech and language. No gross focal neurologic deficits are appreciated.  Skin:  Skin is warm, dry and intact. No rash noted. Psychiatric: Mood and affect are normal. Speech and behavior are normal. Patient exhibits appropriate insight and judgement.   ____________________________________________   LABS (all labs ordered are listed, but only abnormal results are displayed)  Labs Reviewed  URINALYSIS, COMPLETE (UACMP) WITH MICROSCOPIC - Abnormal; Notable for the following:       Result Value   Color, Urine STRAW (*)    APPearance CLEAR (*)    Specific Gravity, Urine 1.002 (*)    Squamous Epithelial / LPF 0-5 (*)    All other components within normal limits  GLUCOSE, CAPILLARY - Abnormal; Notable for the following:    Glucose-Capillary 105 (*)    All other components within normal limits  URINE CULTURE  PREGNANCY, URINE  CBG MONITORING, ED   ____________________________________________  EKG   ____________________________________________  RADIOLOGY   No results found.  ____________________________________________    PROCEDURES  Procedure(s) performed:    Procedures    Medications  methylPREDNISolone sodium succinate (SOLU-MEDROL) 125 mg/2 mL injection 125 mg (125 mg Intravenous Given 08/16/17 2231)     ____________________________________________   INITIAL IMPRESSION / ASSESSMENT AND PLAN / ED COURSE  Pertinent labs & imaging results that were available during my care of the patient were reviewed by me and considered in my medical decision making (see chart for details).  Review of the [place] CSRS was performed in accordance of the [facility] prior to dispensing any controlled drugs.      Assessment and plan Stress incontinence Patient presents to the emergency department with increased urinary frequency after cough.  Patient denies a history of diabetes. Serum glucose levels were reassuring in the emergency department.  Urinalysis was also reassuring.  Was prescribed prednisone and Tessalon Perles for cough.  Vital signs were reassuring prior to discharge.  All patient questions were answered.   ____________________________________________  FINAL CLINICAL IMPRESSION(S) / ED DIAGNOSES  Final diagnoses:  Stress incontinence  Cough      NEW MEDICATIONS STARTED DURING THIS VISIT:  New Prescriptions   BENZONATATE (TESSALON PERLES) 100 MG CAPSULE    Take 1 capsule (100 mg total) by mouth 3 (three) times daily as needed for cough.   PREDNISONE (DELTASONE) 50 MG TABLET    Take one 50 mg tablet once daily for the next five days.        This chart was dictated using voice recognition software/Dragon. Despite best efforts to proofread, errors can occur which can change the meaning. Any change was purely unintentional.    Orvil FeilWoods, Izear Pine M, PA-C 08/16/17 2243    Mc[place],  Rudy Jew, MD 08/16/17 [phone]

## 2017-08-16 NOTE — ED Triage Notes (Signed)
States frequent urination x 4 days. Denies pain. Attempt to obtain urine spec in triage but missed cup.

## 2017-08-18 LAB — URINE CULTURE

## 2017-11-18 ENCOUNTER — Other Ambulatory Visit: Payer: Self-pay

## 2017-11-18 ENCOUNTER — Emergency Department
Admission: EM | Admit: 2017-11-18 | Discharge: 2017-11-18 | Disposition: A | Discharge status: Home / Self Care | Payer: Medicaid Other | Attending: Emergency Medicine | Admitting: Emergency Medicine

## 2017-11-18 DIAGNOSIS — N3001 Acute cystitis with hematuria: Secondary | ICD-10-CM | POA: Insufficient documentation

## 2017-11-18 DIAGNOSIS — R1030 Lower abdominal pain, unspecified: Secondary | ICD-10-CM | POA: Diagnosis present

## 2017-11-18 DIAGNOSIS — J45909 Unspecified asthma, uncomplicated: Secondary | ICD-10-CM | POA: Insufficient documentation

## 2017-11-18 HISTORY — DX: Unspecified asthma, uncomplicated: J45.909

## 2017-11-18 LAB — URINALYSIS, COMPLETE (UACMP) WITH MICROSCOPIC
BILIRUBIN URINE: NEGATIVE
GLUCOSE, UA: NEGATIVE mg/dL
Ketones, ur: NEGATIVE mg/dL
LEUKOCYTES UA: NEGATIVE
NITRITE: NEGATIVE
PROTEIN: NEGATIVE mg/dL
SPECIFIC GRAVITY, URINE: 1.018 (ref 1.005–1.030)
pH: 5 (ref 5.0–8.0)

## 2017-11-18 LAB — CBC
HCT: 41.9 % (ref 35.0–47.0)
HEMOGLOBIN: 14.4 g/dL (ref 12.0–16.0)
MCH: 29.5 pg (ref 26.0–34.0)
MCHC: 34.3 g/dL (ref 32.0–36.0)
MCV: 86 fL (ref 80.0–100.0)
Platelets: 291 10*3/uL (ref 150–440)
RBC: 4.87 MIL/uL (ref 3.80–5.20)
RDW: 12.7 % (ref 11.5–14.5)
WBC: 7.7 10*3/uL (ref 3.6–11.0)

## 2017-11-18 LAB — COMPREHENSIVE METABOLIC PANEL
ALK PHOS: 63 U/L (ref 38–126)
ALT: 63 U/L — ABNORMAL HIGH (ref 14–54)
ANION GAP: 7 (ref 5–15)
AST: 41 U/L (ref 15–41)
Albumin: 4.3 g/dL (ref 3.5–5.0)
BILIRUBIN TOTAL: 0.9 mg/dL (ref 0.3–1.2)
BUN: 13 mg/dL (ref 6–20)
CALCIUM: 9.2 mg/dL (ref 8.9–10.3)
CO2: 30 mmol/L (ref 22–32)
Chloride: 102 mmol/L (ref 101–111)
Creatinine, Ser: 0.76 mg/dL (ref 0.44–1.00)
Glucose, Bld: 118 mg/dL — ABNORMAL HIGH (ref 65–99)
POTASSIUM: 3.7 mmol/L (ref 3.5–5.1)
Sodium: 139 mmol/L (ref 135–145)
TOTAL PROTEIN: 7.4 g/dL (ref 6.5–8.1)

## 2017-11-18 LAB — LIPASE, BLOOD: Lipase: 30 U/L (ref 11–51)

## 2017-11-18 LAB — POCT PREGNANCY, URINE: Preg Test, Ur: NEGATIVE

## 2017-11-18 MED ORDER — ONDANSETRON 4 MG PO TBDP
4.0000 mg | ORAL_TABLET | Freq: Once | ORAL | Status: AC
  Administered 2017-11-18: 4 mg via ORAL
  Filled 2017-11-18: qty 1

## 2017-11-18 MED ORDER — IBUPROFEN 600 MG PO TABS
600.0000 mg | ORAL_TABLET | Freq: Once | ORAL | Status: AC
  Administered 2017-11-18: 600 mg via ORAL
  Filled 2017-11-18: qty 1

## 2017-11-18 MED ORDER — ONDANSETRON HCL 4 MG PO TABS: 4.0000 mg | ORAL_TABLET | Freq: Three times a day (TID) | ORAL | 0 refills | Status: DC | PRN

## 2017-11-18 MED ORDER — CEPHALEXIN 500 MG PO CAPS
500.0000 mg | ORAL_CAPSULE | Freq: Once | ORAL | Status: AC
  Administered 2017-11-18: 500 mg via ORAL
  Filled 2017-11-18: qty 1

## 2017-11-18 MED ORDER — CEPHALEXIN 500 MG PO CAPS: 500.0000 mg | ORAL_CAPSULE | Freq: Four times a day (QID) | ORAL | 0 refills | Status: AC

## 2017-11-18 NOTE — Discharge Instructions (Signed)
You have been seen in the Emergency Department (ED)  today for a urinary tract infection.  Most UTIs are caused by bacteria and need to be treated with antibiotics. It is important to complete your treatment so that the infection does not get worse. Take your antibiotics fully even if your symptoms start to get better after the first few doses. Drink PLENTY of fluids to help clear the infection.  For pain take 600mg  of ibuprofen every 6 hours.  Follow-up with your doctor or return to the ER immediately if your symptoms are getting worse, if you develop a fever, if you develop abdominal or flank pain, or if you start to vomit. Otherwise follow up with your doctor in 1 week if your symptoms are improving.   When should you call for help?  Call your doctor now or seek immediate medical care if:  Symptoms such as a fever, chills, nausea, or vomiting get worse or happen for the first time.  You have new pain in your back just below your rib cage. This is called flank pain.  There is new blood or pus in your urine.  You are not able to take or keep down your antibiotics. Your symptoms are not getting better after 48 hours of antibiotic treatment  Watch closely for changes in your health, and be sure to contact your doctor if:  You are not getting better after taking an antibiotic for 2 days.  Your symptoms go away but then come back.   How can you care for yourself at home?  Take your antibiotics as prescribed. Do not stop taking them just because you feel better. You need to take the full course of antibiotics.  Take your medicines exactly as prescribed. Your doctor may have prescribed a medicine, such as phenazopyridine (Pyridium), to help relieve pain when you urinate. This turns your urine orange. You may stop taking it when your symptoms get better. But be sure to take all of your antibiotics, which treat the infection.  Drink extra water and juices such as cranberry and blueberry juices for  the next day or two. This will help make the urine less concentrated and help wash out the bacteria causing the infection. (If you have kidney, heart, or liver disease and have to limit your fluids, talk with your doctor before you increase your fluid intake.)  Avoid drinks that are carbonated or have caffeine. They can irritate the bladder.  Urinate often. Try to empty your bladder each time.  To relieve pain, take a hot bath or lay a heating pad (set on low) over your lower belly or genital area. Never go to sleep with a heating pad in place.  To help prevent UTIs  Drink plenty of fluids, enough so that your urine is light yellow or clear like water. If you have kidney, heart, or liver disease and have to limit fluids, talk with your doctor before you increase the amount of fluids you drink.  Urinate when you have the urge. Do not hold your urine for a long time. Urinate before you go to sleep.  Keep your vagina/ penis clean.

## 2017-11-18 NOTE — ED Provider Notes (Signed)
[facility] Emergency Department Provider Note  ____________________________________________  Time seen: Approximately 3:43 PM  I have reviewed the triage vital signs and the nursing notes.   HISTORY  Chief Complaint Abdominal Pain   HPI Leslie Allison is a 22 y.o. female with a history of PCOS, mentally challenged, obesity who presents for evaluation of abdominal pain. Patient reports that she has had the pain for 3 days. The pain is located in the suprapubic region and radiating to her back. The pain is sharp and only present when she stands up and walks. She has no pain while sitting down or at rest. She also reports that her both feet hurt when she walks and that has been going on for the same amount of time. She reports that she feels like there is swelling on the side of her feet. She denies any prior abdominal surgeries. She has no pain at this time. She also endorses urinary frequency and nausea that has been ongoing for 3 days. No dysuria, no hematuria, no vaginal discharge, no vomiting. LMP was 2 months ago. No constipation or diarrhea.  Past Medical History:  Diagnosis Date  . Amenorrhea   . Asthma     Patient Active Problem List   Diagnosis Date Noted  . PCO (polycystic ovaries) 12/10/2016  . Vaginal discharge 11/21/2016  . Amenorrhea 07/18/2016  . Morbid obesity with BMI of 45.0-49.9, adult (HCC) 07/18/2016  . Mentally challenged 07/18/2016  . Family history of breast cancer in mother 07/18/2016    Past Surgical History:  Procedure Laterality Date  . NO PAST SURGERIES      Prior to Admission medications   Medication Sig Start Date End Date Taking? Authorizing Provider  cephALEXin (KEFLEX) 500 MG capsule Take 1 capsule (500 mg total) by mouth 4 (four) times daily for 10 days. 11/18/17 11/28/17  Nita Sickle, MD  medroxyPROGESTERone (PROVERA) 10 MG tablet Take 1 tablet (10 mg total) by mouth daily. Patient not taking: Reported on  11/18/2017 12/10/16   Defrancesco, Prentice Docker, MD  norethindrone-ethinyl estradiol (MICROGESTIN) 1-20 MG-MCG tablet Take 1 tablet by mouth daily. Patient not taking: Reported on 11/18/2017 12/10/16   Defrancesco, Prentice Docker, MD  ondansetron (ZOFRAN) 4 MG tablet Take 1 tablet (4 mg total) by mouth every 8 (eight) hours as needed for nausea or vomiting. 11/18/17   Nita Sickle, MD  predniSONE (DELTASONE) 50 MG tablet Take one 50 mg tablet once daily for the next five days. Patient not taking: Reported on 11/18/2017 08/16/17   Orvil Feil, PA-C    Allergies Amoxicillin  Family History  Problem Relation Age of Onset  . Breast cancer Mother   . Ovarian cancer Neg Hx   . Colon cancer Neg Hx   . Diabetes Neg Hx   . Heart disease Neg Hx     Social History Social History   Tobacco Use  . Smoking status: Never Smoker  . Smokeless tobacco: Never Used  Substance Use Topics  . Alcohol use: No  . Drug use: No    Review of Systems  Constitutional: Negative for fever. Eyes: Negative for visual changes. ENT: Negative for sore throat. Neck: No neck pain  Cardiovascular: Negative for chest pain. Respiratory: Negative for shortness of breath. Gastrointestinal: + suprapubic abdominal pain and nausea. No vomiting or diarrhea. Genitourinary: Negative for dysuria. + frequency Musculoskeletal: Negative for back pain. Skin: Negative for rash. Neurological: Negative for headaches, weakness or numbness. Psych: No SI or HI  ____________________________________________  PHYSICAL EXAM:  VITAL SIGNS: ED Triage Vitals [11/18/17 1356]  Enc Vitals Group     BP (!) 154/102     Pulse Rate (!) 103     Resp 17     Temp 99.3 F (37.4 C)     Temp Source Oral     SpO2 97 %     Weight (!) 336 lb 14.4 oz (152.8 kg)     Height 5\' 4"  (1.626 m)     Head Circumference      Peak Flow      Pain Score 9     Pain Loc      Pain Edu?      Excl. in GC?     Constitutional: Alert and oriented. Well  appearing and in no apparent distress. HEENT:      Head: Normocephalic and atraumatic.         Eyes: Conjunctivae are normal. Sclera is non-icteric.       Mouth/Throat: Mucous membranes are moist.       Neck: Supple with no signs of meningismus. Cardiovascular: Regular rate and rhythm. No murmurs, gallops, or rubs. 2+ symmetrical distal pulses are present in all extremities. No JVD. Respiratory: Normal respiratory effort. Lungs are clear to auscultation bilaterally. No wheezes, crackles, or rhonchi.  Gastrointestinal: Morbidly obese, soft, mild suprapubic ttp, no RLQ or RUQ ttp, and non distended with positive bowel sounds. No rebound or guarding. Genitourinary: No CVA tenderness. Musculoskeletal: Nontender with normal range of motion in all extremities. No edema, cyanosis, or erythema of extremities. Neurologic: Normal speech and language. Face is symmetric. Moving all extremities. No gross focal neurologic deficits are appreciated. Skin: Skin is warm, dry and intact. No rash noted. Psychiatric: Mood and affect are normal. Speech and behavior are normal.  ____________________________________________   LABS (all labs ordered are listed, but only abnormal results are displayed)  Labs Reviewed  COMPREHENSIVE METABOLIC PANEL - Abnormal; Notable for the following components:      Result Value   Glucose, Bld 118 (*)    ALT 63 (*)    All other components within normal limits  URINALYSIS, COMPLETE (UACMP) WITH MICROSCOPIC - Abnormal; Notable for the following components:   Color, Urine YELLOW (*)    APPearance HAZY (*)    Hgb urine dipstick SMALL (*)    Bacteria, UA RARE (*)    Squamous Epithelial / LPF 0-5 (*)    All other components within normal limits  URINE CULTURE  LIPASE, BLOOD  CBC  POC URINE PREG, ED  POCT PREGNANCY, URINE   ____________________________________________  EKG  none   ____________________________________________  RADIOLOGY  none ____________________________________________   PROCEDURES  Procedure(s) performed: None Procedures Critical Care performed:  None ____________________________________________   INITIAL IMPRESSION / ASSESSMENT AND PLAN / ED COURSE   22 y.o. female with a history of PCOS, mentally challenged, obesity who presents for evaluation of 3 days of urinary frequency and suprapubic abdominal pain associated with nausea. Vitals signs were repeated during my evaluation with temp 98.42F and HR 98. UA positive for UTI. Labs otherwise WNL. Pregnancy negative. Low clinical suspicion for appendicitis or ovarian pathology with no LLQ or RLQ tenderness. No indication for imaging at this time as patient is pain free and has normal labs.  Presentation is concerning for UTI or possibly early pyelo since patient endorses nausea. Will start patient on keflex and dc home on keflex and zofran. Discussed close follow up and return precautions with patient and her boyfriend.  As part of my medical decision making, I reviewed the following data within the electronic MEDICAL RECORD NUMBER Nursing notes reviewed and incorporated, Labs reviewed , Notes from prior ED visits and [place] Controlled Substance Database    Pertinent labs & imaging results that were available during my care of the patient were reviewed by me and considered in my medical decision making (see chart for details).    ____________________________________________   FINAL CLINICAL IMPRESSION(S) / ED DIAGNOSES  Final diagnoses:  Acute cystitis with hematuria      NEW MEDICATIONS STARTED DURING THIS VISIT:  ED Discharge Orders        Ordered    cephALEXin (KEFLEX) 500 MG capsule  4 times daily     11/18/17 1552    ondansetron (ZOFRAN) 4 MG tablet  Every 8 hours PRN     11/18/17 1552       Note:  This document was prepared using Dragon voice recognition software and may  include unintentional dictation errors.    Don PerkingVeronese, [place], MD 11/18/17 [phone]

## 2017-11-18 NOTE — ED Notes (Signed)
AAOx3.  Skin warm and dry.  NAD 

## 2017-11-18 NOTE — ED Notes (Signed)
First nursenote  Presents with abd pain and lower back pain

## 2017-11-18 NOTE — ED Triage Notes (Signed)
Pt has multiple complaints. Pt c/o right lower abd pain that radiates around to the back with nausea and dizziness for the past 2-3 days. Pt also c/o BL feet and back pain for the past 2 weeks.

## 2017-11-20 LAB — URINE CULTURE: Culture: >=100000 — AB

## 2017-11-21 NOTE — Progress Notes (Signed)
ED CULTURE REPORT   22 yo female presented to ED on 2/5 with complaints of abdominal pain and UA showing rare bacteria. A urine culture was obtained and patient was discharged on keflex 500mg  four times daily for 10 days. The urine culture resulted on 2/8 showing Proteus mirabilis sensitive to cephazolin. I spoke with ED MD Dr. Darnelle CatalanMalinda who agreed that no further intervention was needed.   Results for orders placed or performed during the hospital encounter of 11/18/17  Urine Culture     Status: Abnormal   Collection Time: 11/18/17  1:53 PM  Result Value Ref Range Status   Specimen Description   Final    URINE, RANDOM Performed at [facility], [address] [place], [place] [address]    Special Requests   Final    NONE Performed at [facility], [address] [place], [place] [address]    Culture >=100,000 COLONIES/mL PROTEUS MIRABILIS (A)  Final   Report Status 11/20/2017 FINAL  Final   Organism ID, Bacteria PROTEUS MIRABILIS (A)  Final      Susceptibility   Proteus mirabilis - MIC*    AMPICILLIN <=2 SENSITIVE Sensitive     CEFAZOLIN <=4 SENSITIVE Sensitive     CEFTRIAXONE <=1 SENSITIVE Sensitive     CIPROFLOXACIN <=0.25 SENSITIVE Sensitive     GENTAMICIN <=1 SENSITIVE Sensitive     IMIPENEM 2 SENSITIVE Sensitive     NITROFURANTOIN 128 RESISTANT Resistant     TRIMETH/SULFA <=20 SENSITIVE Sensitive     AMPICILLIN/SULBACTAM <=2 SENSITIVE Sensitive     PIP/TAZO <=4 SENSITIVE Sensitive     * >=100,000 COLONIES/mL PROTEUS MIRABILIS   Yolanda BonineHannah Lifsey, PharmD Pharmacy Resident

## 2018-02-12 ENCOUNTER — Encounter: Payer: Self-pay | Admitting: Emergency Medicine

## 2018-02-12 ENCOUNTER — Other Ambulatory Visit: Payer: Self-pay

## 2018-02-12 ENCOUNTER — Emergency Department
Admission: EM | Admit: 2018-02-12 | Discharge: 2018-02-12 | Disposition: A | Discharge status: Home / Self Care | Payer: PRIVATE HEALTH INSURANCE | Attending: Emergency Medicine | Admitting: Emergency Medicine

## 2018-02-12 DIAGNOSIS — M79672 Pain in left foot: Secondary | ICD-10-CM | POA: Diagnosis not present

## 2018-02-12 DIAGNOSIS — J45909 Unspecified asthma, uncomplicated: Secondary | ICD-10-CM | POA: Insufficient documentation

## 2018-02-12 DIAGNOSIS — M79671 Pain in right foot: Secondary | ICD-10-CM | POA: Insufficient documentation

## 2018-02-12 DIAGNOSIS — Z79899 Other long term (current) drug therapy: Secondary | ICD-10-CM | POA: Diagnosis not present

## 2018-02-12 DIAGNOSIS — I10 Essential (primary) hypertension: Secondary | ICD-10-CM | POA: Insufficient documentation

## 2018-02-12 DIAGNOSIS — Z6841 Body Mass Index (BMI) 40.0 and over, adult: Secondary | ICD-10-CM | POA: Diagnosis not present

## 2018-02-12 MED ORDER — MELOXICAM 7.5 MG PO TABS: 7.5000 mg | ORAL_TABLET | Freq: Every day | ORAL | 0 refills | Status: DC

## 2018-02-12 NOTE — ED Triage Notes (Signed)
Pt arrived via POV with husband, pt states she has noticed increased pain in feet. Pt states she just started a new job at [employer] and is on her feet a lot more. Pt states she is having ankle pain and foot pain. Pt states she keeps shoes tied tight as well.

## 2018-02-12 NOTE — ED Provider Notes (Addendum)
[facility] EMERGENCY DEPARTMENT Provider Note   CSN: [id number] Arrival date & time: 02/12/18  1559     History   Chief Complaint Chief Complaint  Patient presents with  . Foot Pain    HPI Leslie Allison is a 22 y.o. female presents to the emergency department for evaluation of bilateral foot pain.  Pain is been present for 2 days.  She recently started a new job 2 weeks ago at [employer] where she is on her feet all day.  She describes aching pain at the end of the day.  She denies any trauma or injury.  Pain is along the calcaneus bilaterally.  No numbness tingling or radicular symptoms.  She denies any swelling in the lower legs.  She is not established with PCP, denies any headache, vision changes, chest pain or shortness of breath.  She is noted to be hypertensive patient here in the emergency department.  Patient has not taken any medications for her pain.  Pain is 4 out of 10 bilaterally, improved with sitting down increased with standing.  HPI  Past Medical History:  Diagnosis Date  . Amenorrhea   . Asthma     Patient Active Problem List   Diagnosis Date Noted  . PCO (polycystic ovaries) 12/10/2016  . Vaginal discharge 11/21/2016  . Amenorrhea 07/18/2016  . Morbid obesity with BMI of 45.0-49.9, adult (HCC) 07/18/2016  . Mentally challenged 07/18/2016  . Family history of breast cancer in mother 07/18/2016    Past Surgical History:  Procedure Laterality Date  . NO PAST SURGERIES       OB History    Gravida  1   Para  0   Term  0   Preterm  0   AB  1   Living  0     SAB  1   TAB  0   Ectopic  0   Multiple  0   Live Births  0            Home Medications    Prior to Admission medications   Medication Sig Start Date End Date Taking? Authorizing Provider  medroxyPROGESTERone (PROVERA) 10 MG tablet Take 1 tablet (10 mg total) by mouth daily. Patient not taking: Reported on 11/18/2017 12/10/16   Defrancesco, Prentice Docker, MD    meloxicam (MOBIC) 7.5 MG tablet Take 1 tablet (7.5 mg total) by mouth daily. 02/12/18 02/12/19  Evon Slack, PA-C  norethindrone-ethinyl estradiol (MICROGESTIN) 1-20 MG-MCG tablet Take 1 tablet by mouth daily. Patient not taking: Reported on 11/18/2017 12/10/16   Defrancesco, Prentice Docker, MD  ondansetron (ZOFRAN) 4 MG tablet Take 1 tablet (4 mg total) by mouth every 8 (eight) hours as needed for nausea or vomiting. 11/18/17   Nita Sickle, MD  predniSONE (DELTASONE) 50 MG tablet Take one 50 mg tablet once daily for the next five days. Patient not taking: Reported on 11/18/2017 08/16/17   Orvil Feil, PA-C    Family History Family History  Problem Relation Age of Onset  . Breast cancer Mother   . Ovarian cancer Neg Hx   . Colon cancer Neg Hx   . Diabetes Neg Hx   . Heart disease Neg Hx     Social History Social History   Tobacco Use  . Smoking status: Never Smoker  . Smokeless tobacco: Never Used  Substance Use Topics  . Alcohol use: No  . Drug use: No     Allergies   Amoxicillin  Review of Systems Review of Systems  Constitutional: Negative for fever.  Respiratory: Negative for shortness of breath.   Cardiovascular: Negative for chest pain.  Gastrointestinal: Negative for abdominal pain.  Genitourinary: Negative for difficulty urinating, dysuria and urgency.  Musculoskeletal: Positive for arthralgias. Negative for back pain, gait problem, joint swelling and myalgias.  Skin: Negative for rash.  Neurological: Negative for dizziness and headaches.     Physical Exam Updated Vital Signs BP (!) 154/105 (BP Location: Left Arm)   Pulse 94   Temp 98.2 F (36.8 C) (Oral)   Resp 18   Ht  (1.626 m)   Wt (!) 178.3 kg (393 lb)   SpO2 98%   BMI 67.46 kg/m   Physical Exam  Constitutional: She is oriented to person, place, and time. She appears well-developed and well-nourished.  HENT:  Head: Normocephalic and atraumatic.  Eyes: Conjunctivae are normal.  Neck:  Normal range of motion.  Cardiovascular: Normal rate.  Pulmonary/Chest: Effort normal. No respiratory distress.  Musculoskeletal: Normal range of motion.  Examination of bilateral lower extremities showed she has no swelling warmth or erythema.  No edema.  Patient is minimally tender along the medial portion of both calcanei along the soft tissues with no tenderness along the base of the calcaneus.  2+ dorsalis pedis pulses bilaterally.  Normal ankle plantar flexion dorsiflexion.  Patient is able to ambulate with no antalgic gait.  No bony tenderness to palpation.  Neurological: She is alert and oriented to person, place, and time.  Skin: Skin is warm. No rash noted.  Psychiatric: She has a normal mood and affect. Her behavior is normal. Thought content normal.     ED Treatments / Results  Labs (all labs ordered are listed, but only abnormal results are displayed) Labs Reviewed - No data to display  EKG None  Radiology No results found.  Procedures Procedures (including critical care time)  Medications Ordered in ED Medications - No data to display   Initial Impression / Assessment and Plan / ED Course  I have reviewed the triage vital signs and the nursing notes.  Pertinent labs & imaging results that were available during my care of the patient were reviewed by me and considered in my medical decision making (see chart for details).     22 year old female with bilateral feet pain after starting a new job.  Pain is increased at the end of the day and improved in the morning.  No signs of compartment syndrome, DVT, cellulitis, abscess.  No indication for x-rays.  For fracture she is not taking any anti-inflammatory medications.  Patient's 393 pounds, did discuss importance of weight loss.  Also discussed importance of establishing care with PCP to help patient maintain her health.  Patient will start Mobic 7.5 mg daily.  She is given a note to remain out of work for 2 days.  She  is educated on signs and symptoms to return to the ED for such as any increasing pain, swelling, fevers, warmth, redness, headaches, chest pain or shortness of breath.  Final Clinical Impressions(s) / ED Diagnoses   Final diagnoses:  Class 3 severe obesity in adult, unspecified BMI, unspecified obesity type, unspecified whether serious comorbidity present (HCC)  Foot pain, bilateral  Hypertension, unspecified type    ED Discharge Orders        Ordered    meloxicam (MOBIC) 7.5 MG tablet  Daily     02/12/18 1638       Evon Slack, [place]  02/12/18 1718    Evon Slack, PA-C 02/12/18 1719    Jeanmarie Plant, MD 02/18/18 [phone]

## 2018-02-12 NOTE — Discharge Instructions (Signed)
Please take mobic daily as prescribed. Rest ice and elevate feet. Call clinic to establish care with primary care physician as soon as possible. Return to the ED for any increasing pain, worsening symptoms or for any urgent changes in your health.

## 2018-02-12 NOTE — ED Notes (Signed)
See PA assessment 

## 2018-04-18 ENCOUNTER — Encounter: Payer: Self-pay | Admitting: Emergency Medicine

## 2018-04-18 ENCOUNTER — Other Ambulatory Visit: Payer: Self-pay

## 2018-04-18 ENCOUNTER — Emergency Department
Admission: EM | Admit: 2018-04-18 | Discharge: 2018-04-18 | Disposition: A | Discharge status: Home / Self Care | Payer: PRIVATE HEALTH INSURANCE | Attending: Emergency Medicine | Admitting: Emergency Medicine

## 2018-04-18 DIAGNOSIS — N390 Urinary tract infection, site not specified: Secondary | ICD-10-CM | POA: Insufficient documentation

## 2018-04-18 DIAGNOSIS — J45909 Unspecified asthma, uncomplicated: Secondary | ICD-10-CM | POA: Insufficient documentation

## 2018-04-18 DIAGNOSIS — R1030 Lower abdominal pain, unspecified: Secondary | ICD-10-CM | POA: Diagnosis present

## 2018-04-18 LAB — URINALYSIS, COMPLETE (UACMP) WITH MICROSCOPIC
Bilirubin Urine: NEGATIVE
GLUCOSE, UA: NEGATIVE mg/dL
KETONES UR: NEGATIVE mg/dL
Nitrite: POSITIVE — AB
Protein, ur: 30 mg/dL — AB
Specific Gravity, Urine: 1.02 (ref 1.005–1.030)
pH: 6 (ref 5.0–8.0)

## 2018-04-18 LAB — COMPREHENSIVE METABOLIC PANEL
ALBUMIN: 3.9 g/dL (ref 3.5–5.0)
ALK PHOS: 64 U/L (ref 38–126)
ALT: 72 U/L — ABNORMAL HIGH (ref 0–44)
ANION GAP: 7 (ref 5–15)
AST: 51 U/L — ABNORMAL HIGH (ref 15–41)
BUN: 11 mg/dL (ref 6–20)
CHLORIDE: 106 mmol/L (ref 98–111)
CO2: 29 mmol/L (ref 22–32)
Calcium: 8.9 mg/dL (ref 8.9–10.3)
Creatinine, Ser: 0.83 mg/dL (ref 0.44–1.00)
GFR calc non Af Amer: >60 mL/min (ref >60)
GLUCOSE: 156 mg/dL — AB (ref 70–99)
POTASSIUM: 3.4 mmol/L — AB (ref 3.5–5.1)
SODIUM: 142 mmol/L (ref 135–145)
Total Bilirubin: 0.9 mg/dL (ref 0.3–1.2)
Total Protein: 7 g/dL (ref 6.5–8.1)

## 2018-04-18 LAB — POCT PREGNANCY, URINE: PREG TEST UR: NEGATIVE

## 2018-04-18 LAB — CBC
HEMATOCRIT: 38.9 % (ref 35.0–47.0)
HEMOGLOBIN: 13.8 g/dL (ref 12.0–16.0)
MCH: 30.8 pg (ref 26.0–34.0)
MCHC: 35.6 g/dL (ref 32.0–36.0)
MCV: 86.6 fL (ref 80.0–100.0)
Platelets: 300 10*3/uL (ref 150–440)
RBC: 4.49 MIL/uL (ref 3.80–5.20)
RDW: 12.9 % (ref 11.5–14.5)
WBC: 8.7 10*3/uL (ref 3.6–11.0)

## 2018-04-18 LAB — LIPASE, BLOOD: LIPASE: 34 U/L (ref 11–51)

## 2018-04-18 MED ORDER — PHENAZOPYRIDINE HCL 200 MG PO TABS
200.0000 mg | ORAL_TABLET | Freq: Once | ORAL | Status: AC
  Administered 2018-04-18: 200 mg via ORAL
  Filled 2018-04-18: qty 1

## 2018-04-18 MED ORDER — PHENAZOPYRIDINE HCL 200 MG PO TABS: 200.0000 mg | ORAL_TABLET | Freq: Three times a day (TID) | ORAL | 0 refills | Status: DC | PRN

## 2018-04-18 MED ORDER — SULFAMETHOXAZOLE-TRIMETHOPRIM 800-160 MG PO TABS: 1.0000 | ORAL_TABLET | Freq: Two times a day (BID) | ORAL | 0 refills | Status: DC

## 2018-04-18 MED ORDER — SULFAMETHOXAZOLE-TRIMETHOPRIM 800-160 MG PO TABS
1.0000 | ORAL_TABLET | Freq: Once | ORAL | Status: AC
  Administered 2018-04-18: 1 via ORAL
  Filled 2018-04-18: qty 1

## 2018-04-18 NOTE — ED Notes (Signed)
First Nurse Note:  Patient reports bilateral back pain radiating down her legs and into her feet.  Patient also reports abdominal pain, nausea, and diarrhea.  Denies vomiting.  Patient states, "I just don't know what's wrong with me.  I called my boyfriend at work crying."  Patient is in no obvious distress at this time.  Ambulatory to registration desk with steady gait.

## 2018-04-18 NOTE — ED Triage Notes (Signed)
Pt arrives ambulatory with c/o lower abdominal pain x a "few days". Pt c/o lower abdominal pain that radiates around to her lower back. Pt is in NAD.

## 2018-04-18 NOTE — ED Provider Notes (Signed)
[facility] Emergency Department Provider Note       Time seen: ----------------------------------------- 7:41 PM on 04/18/2018 -----------------------------------------   I have reviewed the triage vital signs and the nursing notes.  HISTORY   Chief Complaint Abdominal Pain    HPI Leslie Allison is a 22 y.o. female with a history of asthma who presents to the ED for asthma, morbid obesity and mental retardation who presents to the ER for lower abdominal pain for the past several days.  She states pain radiates into her lower back.  She also complains of pain radiating down her legs, into her feet.  She has nausea and diarrhea.  She has had some vaginal bleeding but no discharge.  Past Medical History:  Diagnosis Date  . Amenorrhea   . Asthma     Patient Active Problem List   Diagnosis Date Noted  . PCO (polycystic ovaries) 12/10/2016  . Vaginal discharge 11/21/2016  . Amenorrhea 07/18/2016  . Morbid obesity with BMI of 45.0-49.9, adult (HCC) 07/18/2016  . Mentally challenged 07/18/2016  . Family history of breast cancer in mother 07/18/2016    Past Surgical History:  Procedure Laterality Date  . NO PAST SURGERIES      Allergies Amoxicillin  Social History Social History   Tobacco Use  . Smoking status: Never Smoker  . Smokeless tobacco: Never Used  Substance Use Topics  . Alcohol use: No  . Drug use: No   Review of Systems Constitutional: Negative for fever. Cardiovascular: Negative for chest pain. Respiratory: Negative for shortness of breath. Gastrointestinal: Positive for abdominal pain, nausea and diarrhea Genitourinary: Positive for polyuria Musculoskeletal: Positive for back pain Skin: Negative for rash. Neurological: Negative for headaches, focal weakness or numbness.  All systems negative/normal/unremarkable except as stated in the HPI  ____________________________________________   PHYSICAL EXAM:  VITAL  SIGNS: ED Triage Vitals  Enc Vitals Group     BP 04/18/18 1908 (!) 154/100     Pulse Rate 04/18/18 1908 97     Resp 04/18/18 1908 16     Temp 04/18/18 1908 98.1 F (36.7 C)     Temp Source 04/18/18 1908 Oral     SpO2 04/18/18 1908 98 %     Weight 04/18/18 1905 (!) 393 lb (178.3 kg)     Height 04/18/18 1905 5\' 2"  (1.575 m)     Head Circumference --      Peak Flow --      Pain Score 04/18/18 1905 5     Pain Loc --      Pain Edu? --      Excl. in GC? --    Constitutional: Alert and oriented. Well appearing and in no distress.  Morbidly obese Eyes: Conjunctivae are normal. Normal extraocular movements. Cardiovascular: Normal rate, regular rhythm. No murmurs, rubs, or gallops. Respiratory: Normal respiratory effort without tachypnea nor retractions. Breath sounds are clear and equal bilaterally. No wheezes/rales/rhonchi. Gastrointestinal: Soft and nontender. Normal bowel sounds Musculoskeletal: Nontender with normal range of motion in extremities. No lower extremity tenderness nor edema. Neurologic:  Normal speech and language. No gross focal neurologic deficits are appreciated.  Skin:  Skin is warm, dry and intact. No rash noted. Psychiatric: Mood and affect are normal. Speech and behavior are normal.  ___________________________________________  ED COURSE:  As part of my medical decision making, I reviewed the following data within the electronic MEDICAL RECORD NUMBER History obtained from family if available, nursing notes, old chart and ekg, as well as notes from  prior ED visits. Patient presented for abdominal pain, we will assess with labs as indicated at this time.   Procedures ____________________________________________   LABS (pertinent positives/negatives)  Labs Reviewed  URINALYSIS, COMPLETE (UACMP) WITH MICROSCOPIC - Abnormal; Notable for the following components:      Result Value   Color, Urine YELLOW (*)    APPearance HAZY (*)    Hgb urine dipstick MODERATE (*)     Protein, ur 30 (*)    Nitrite POSITIVE (*)    Leukocytes, UA MODERATE (*)    WBC, UA >50 (*)    Bacteria, UA RARE (*)    All other components within normal limits  CBC  LIPASE, BLOOD  COMPREHENSIVE METABOLIC PANEL  POC URINE PREG, ED  POCT PREGNANCY, URINE   ____________________________________________  DIFFERENTIAL DIAGNOSIS   UTI, pyelonephritis, renal colic, gas pain, constipation, pregnancy  FINAL ASSESSMENT AND PLAN  UTI   Plan: The patient had presented for abdominal pain as well as polyuria. Patient's labs did reveal significant urinary tract infection.  I think the likelihood of this being pyelo or renal colic is low.  She be discharged with Pyridium as well as antibiotics and is encouraged to have close outpatient follow-up.   Ulice Dash, MD   Note: This note was generated in part or whole with voice recognition software. Voice recognition is usually quite accurate but there are transcription errors that can and very often do occur. I apologize for any typographical errors that were not detected and corrected.     Emily Filbert, MD 04/18/18 1950

## 2018-06-05 ENCOUNTER — Ambulatory Visit (INDEPENDENT_AMBULATORY_CARE_PROVIDER_SITE_OTHER): Payer: PRIVATE HEALTH INSURANCE | Admitting: Obstetrics and Gynecology

## 2018-06-05 ENCOUNTER — Encounter: Payer: Self-pay | Admitting: Obstetrics and Gynecology

## 2018-06-05 VITALS — BP 138/92 | HR 118 | Ht 62.0 in | Wt 340.0 lb

## 2018-06-05 DIAGNOSIS — E282 Polycystic ovarian syndrome: Secondary | ICD-10-CM

## 2018-06-05 DIAGNOSIS — N912 Amenorrhea, unspecified: Secondary | ICD-10-CM

## 2018-06-05 DIAGNOSIS — N926 Irregular menstruation, unspecified: Secondary | ICD-10-CM

## 2018-06-05 DIAGNOSIS — Z6841 Body Mass Index (BMI) 40.0 and over, adult: Secondary | ICD-10-CM | POA: Diagnosis not present

## 2018-06-05 NOTE — Progress Notes (Signed)
Patient ID: Leslie Allison, female   DOB: 04-25-1996,Leslie Allison 22 y.o.   MRN: [id number]  Reason for Consult: PCOS (Discuss fertility and weight loss) and Metrorrhagia   Referred by No ref. provider found  Subjective:     HPI:  Leslie LarkCarrie Fray is a 22 y.o. female Leslie SonCarrie presents today as follow-up from the emergency room she was seen there recently and had a negative pregnancy test.  She was told in the emergency room that she had PCOS.  This was distressing to her.  [facility] had [facility] seen [facility] as a patient.  She said that they never told her that she had PCOS.  She is unable to describe any type of bleeding pattern.  She states that she does not keep track of her periods.  She cannot tell me if she has bleeding every month.  She cannot estimate a number of days a year that she has bleeding.  She cannot tell me when her bleeding occurs how long it lasts for.  She has had significant weight gain over the last 6 years.  She reports that at the age of 22 she was playing soccer and weighed 140 pounds.  She says that she has weighed as much as 493 pounds.  She says that she has lost a significant amount of weight.  Today she weighs 340 pounds.  Her BMI is still significantly elevated at 62.  It sounds like many of the women in her family including her mother, sister, and aunt are obese.  She says that her sister weighs over 600 pounds.  She says that she would like to get back to weighing 140 pounds and playing soccer like she used to.  She is also very motivated to lose weight so that she can become pregnant.  She reports that she was married at the age of 22 and since that age her and her partner have been trying to conceive.  She has not had any pregnancies.  She does report that she took birth control briefly at the age of 22 until she was married at [age].  This was because she was sexually active at the age of 89 and her grandparents thought it would be a good idea.    Past Medical History:  Diagnosis Date  .  Amenorrhea   . Asthma    Family History  Problem Relation Age of Onset  . Breast cancer Mother   . Ovarian cancer Neg Hx   . Colon cancer Neg Hx   . Diabetes Neg Hx   . Heart disease Neg Hx    Past Surgical History:  Procedure Laterality Date  . NO PAST SURGERIES      Short Social History:  Social History   Tobacco Use  . Smoking status: Never Smoker  . Smokeless tobacco: Never Used  Substance Use Topics  . Alcohol use: No    Allergies  Allergen Reactions  . Amoxicillin     Has patient had a PCN reaction causing immediate rash, facial/tongue/throat swelling, SOB or lightheadedness with hypotension: No Has patient had a PCN reaction causing severe rash involving mucus membranes or skin necrosis: No Has patient had a PCN reaction that required hospitalization: No Has patient had a PCN reaction occurring within the last 10 years: No If all of the above answers are "NO", then may proceed with Cephalosporin use.      No current outpatient medications on file.   No current facility-administered medications for this visit.  Review of Systems  Constitutional: Positive for fatigue. Negative for chills, fever and unexpected weight change.  HENT: Negative for trouble swallowing.  Eyes: Negative for loss of vision.  Respiratory: Positive for shortness of breath. Negative for cough and wheezing.  Cardiovascular: Negative for chest pain, leg swelling, palpitations and syncope.  GI: Negative for abdominal pain, blood in stool, diarrhea, nausea and vomiting.  GU: Negative for difficulty urinating, dysuria, frequency and hematuria.  Musculoskeletal: Negative for back pain, leg pain and joint pain.  Skin: Negative for rash.  Neurological: Negative for dizziness, headaches, light-headedness, numbness and seizures.  Psychiatric: Negative for behavioral problem, confusion, depressed mood and sleep disturbance.  Review of systems positive for fatigue changes in vision hearing  changes breast tenderness shortness of breath     Objective:  Objective   Vitals:   06/05/18 1526  BP: (!) 138/92  Pulse: (!) 118  Weight: (!) 340 lb (154.2 kg)  Height: 5\' 2"  (1.575 m)   Body mass index is 62.19 kg/m.  Physical Exam  Constitutional: She is oriented to person, place, and time. She appears well-developed and well-nourished.  HENT:  Head: Normocephalic and atraumatic.  Eyes: Pupils are equal, round, and reactive to light. EOM are normal.  Cardiovascular: Normal rate and regular rhythm.  Pulmonary/Chest: Effort normal. No respiratory distress.  Neurological: She is alert and oriented to person, place, and time.  Skin: Skin is warm and dry.  Psychiatric: She has a normal mood and affect. Her behavior is normal. Judgment and thought content normal.  Nursing note and vitals reviewed.        Assessment/Plan:       Discussed goals of weight loss. Discussed weight watchers and nutrition consult with patient. She is very motivated to loose weight so that she can become pregnant.  I recommended that the patient's bmi via in a healthier range ideally less than 30.  I discussed with her that her BMI would need to be in the 40s before we can considered ovulation induction.  I went over many of the risk factors of obesity with the patient including heart disease hypertension diabetes stroke and premature death.  I also went over the pregnancy associated risks of neural tube defects gestational diabetes preeclampsia and miscarriage.  She can relate to this because her mother had difficulty during pregnancy and experienced a miscarriage.   She will fill out a bleeding calendar so that this can be a tool to determine if she is anovulatory.  She will fill out the bleeding calendar for 3 months and then return with it at her next visit.  Given her BMI and an ultrasound in 2018 that showed characteristic string of pearl appearance of the ovaries patient likely has PCOS although  endocrine testing can be performed at her next visit if anovulation is confirmed with bleeding calendar.   Elevated blood pressure today in office.  Will repeat at her next visit.  If it is high she likely has hypertension and will need to be treated for this.   More than 45 minutes were spent face to face with the patient in the room with more than 50% of the time spent providing counseling and discussing the plan of management. We discussed PCOS, annovulation, obesity, infertility and more.    Adelene Idler MD [facility], [facility] 06/05/18 4:02 PM

## 2018-09-03 ENCOUNTER — Ambulatory Visit: Payer: PRIVATE HEALTH INSURANCE | Admitting: Obstetrics and Gynecology

## 2018-10-24 ENCOUNTER — Other Ambulatory Visit: Payer: Self-pay

## 2018-10-24 ENCOUNTER — Emergency Department
Admission: EM | Admit: 2018-10-24 | Discharge: 2018-10-25 | Disposition: A | Discharge status: Home / Self Care | Payer: PRIVATE HEALTH INSURANCE | Attending: Student in an Organized Health Care Education/Training Program | Admitting: Student in an Organized Health Care Education/Training Program

## 2018-10-24 ENCOUNTER — Encounter: Payer: Self-pay | Admitting: Emergency Medicine

## 2018-10-24 DIAGNOSIS — N939 Abnormal uterine and vaginal bleeding, unspecified: Secondary | ICD-10-CM | POA: Diagnosis not present

## 2018-10-24 DIAGNOSIS — J45909 Unspecified asthma, uncomplicated: Secondary | ICD-10-CM | POA: Diagnosis not present

## 2018-10-24 DIAGNOSIS — R079 Chest pain, unspecified: Secondary | ICD-10-CM

## 2018-10-24 LAB — CBC WITH DIFFERENTIAL/PLATELET
ABS IMMATURE GRANULOCYTES: 0.02 10*3/uL (ref 0.00–0.07)
Basophils Absolute: 0 10*3/uL (ref 0.0–0.1)
Basophils Relative: 1 %
Eosinophils Absolute: 0.3 10*3/uL (ref 0.0–0.5)
Eosinophils Relative: 4 %
HCT: 39.9 % (ref 36.0–46.0)
HEMOGLOBIN: 13.2 g/dL (ref 12.0–15.0)
Immature Granulocytes: 0 %
LYMPHS PCT: 39 %
Lymphs Abs: 3.2 10*3/uL (ref 0.7–4.0)
MCH: 28.8 pg (ref 26.0–34.0)
MCHC: 33.1 g/dL (ref 30.0–36.0)
MCV: 87.1 fL (ref 80.0–100.0)
Monocytes Absolute: 0.5 10*3/uL (ref 0.1–1.0)
Monocytes Relative: 7 %
NEUTROS ABS: 4 10*3/uL (ref 1.7–7.7)
NRBC: 0 % (ref 0.0–0.2)
Neutrophils Relative %: 49 %
Platelets: 291 10*3/uL (ref 150–400)
RBC: 4.58 MIL/uL (ref 3.87–5.11)
RDW: 11.8 % (ref 11.5–15.5)
WBC: 8.1 10*3/uL (ref 4.0–10.5)

## 2018-10-24 LAB — BASIC METABOLIC PANEL
Anion gap: 9 (ref 5–15)
BUN: 15 mg/dL (ref 6–20)
CO2: 29 mmol/L (ref 22–32)
CREATININE: 0.71 mg/dL (ref 0.44–1.00)
Calcium: 9.3 mg/dL (ref 8.9–10.3)
Chloride: 100 mmol/L (ref 98–111)
GFR calc Af Amer: >60 mL/min (ref >60)
GFR calc non Af Amer: >60 mL/min (ref >60)
GLUCOSE: 140 mg/dL — AB (ref 70–99)
Potassium: 4.2 mmol/L (ref 3.5–5.1)
Sodium: 138 mmol/L (ref 135–145)

## 2018-10-24 LAB — HCG, QUANTITATIVE, PREGNANCY: hCG, Beta Chain, Quant, S: <1 m[IU]/mL (ref <5)

## 2018-10-24 LAB — ABO/RH: ABO/RH(D): A NEG

## 2018-10-24 NOTE — ED Triage Notes (Signed)
Pt arrives ambulatory and with steady gait to triage with c/o vaginal bleeding x 95 days. Pt states that she has not been seen about this yet as she does not have insurance. Pt states that she does not use pads but uses tampons and reports that she goes through a super tampon in about 1 1/2 hour for the entirety of the 95 days. Pt is not pale at this time in triage and is in NAD.

## 2018-10-25 MED ORDER — ALBUTEROL SULFATE HFA 108 (90 BASE) MCG/ACT IN AERS: 2.0000 | INHALATION_SPRAY | Freq: Four times a day (QID) | RESPIRATORY_TRACT | 2 refills | Status: DC | PRN

## 2018-10-25 MED ORDER — PREDNISONE 20 MG PO TABS: 40.0000 mg | ORAL_TABLET | Freq: Every day | ORAL | 0 refills | Status: AC

## 2018-10-25 NOTE — ED Notes (Signed)
ED Provider at bedside.  Pt reports sharp pain "under my boob", indicates to right side, also c/o bleeding for the last 90 days and now with clots  Reports hx of PCOS and asthma

## 2018-10-25 NOTE — ED Provider Notes (Signed)
[facility] Emergency Department Provider Note    First MD Initiated Contact with Patient 10/24/18 2356     (approximate)  I have reviewed the triage vital signs and the nursing notes.   HISTORY  Chief Complaint Vaginal Bleeding    HPI Leslie Allison is a 23 y.o. female with a history of irregular menses as well as history of PCOS presents the ER for 95 days of vaginal spotting.  States that she is primarily spotting but is also been having clots.  She is on any blood thinners.  States she was also come to the ER tonight because she developed pain under her right breast.  States that she has been having a cough.  Not currently on any birth control.  She does not smoke.  Denies any pelvic or abdominal pain.  No nausea or vomiting.  No pleuritic pain.  Does have history of asthma.    Past Medical History:  Diagnosis Date  . Amenorrhea   . Asthma    Family History  Problem Relation Age of Onset  . Breast cancer Mother   . Ovarian cancer Neg Hx   . Colon cancer Neg Hx   . Diabetes Neg Hx   . Heart disease Neg Hx    Past Surgical History:  Procedure Laterality Date  . NO PAST SURGERIES     Patient Active Problem List   Diagnosis Date Noted  . PCO (polycystic ovaries) 12/10/2016  . Vaginal discharge 11/21/2016  . Amenorrhea 07/18/2016  . Morbid obesity with BMI of 45.0-49.9, adult (HCC) 07/18/2016  . Mentally challenged 07/18/2016  . Family history of breast cancer in mother 07/18/2016      Prior to Admission medications   Medication Sig Start Date End Date Taking? Authorizing Provider  albuterol (PROVENTIL HFA;VENTOLIN HFA) 108 (90 Base) MCG/ACT inhaler Inhale 2 puffs into the lungs every 6 (six) hours as needed for wheezing or shortness of breath. 10/25/18   Willy Eddy, MD  predniSONE (DELTASONE) 20 MG tablet Take 2 tablets (40 mg total) by mouth daily for 5 days. 10/25/18 10/30/18  Willy Eddy, MD     Allergies Amoxicillin    Social History Social History   Tobacco Use  . Smoking status: Never Smoker  . Smokeless tobacco: Never Used  Substance Use Topics  . Alcohol use: No  . Drug use: No    Review of Systems Patient denies headaches, rhinorrhea, blurry vision, numbness, shortness of breath, chest pain, edema, cough, abdominal pain, nausea, vomiting, diarrhea, dysuria, fevers, rashes or hallucinations unless otherwise stated above in HPI. ____________________________________________   PHYSICAL EXAM:  VITAL SIGNS: Vitals:   10/24/18 1909 10/24/18 2235  BP: (!) 149/102 (!) 139/92  Pulse: 97 94  Resp: 18 18  Temp: 98.5 F (36.9 C)   SpO2: 99% 98%    Constitutional: Alert and oriented.  Eyes: Conjunctivae are normal.  Head: Atraumatic. Nose: No congestion/rhinnorhea. Mouth/Throat: Mucous membranes are moist.   Neck: No stridor. Painless ROM.  Cardiovascular: Normal rate, regular rhythm. Grossly normal heart sounds.  Good peripheral circulation. Respiratory: Normal respiratory effort.  No retractions. Lungs with scattered wheeze throughout Gastrointestinal: Soft and nontender. No distention. No abdominal bruits. No CVA tenderness. Genitourinary: declined by patient Musculoskeletal: No lower extremity tenderness nor edema.  No joint effusions. Neurologic:  Normal speech and language. No gross focal neurologic deficits are appreciated. No facial droop Skin:  Skin is warm, dry and intact. No rash noted. Psychiatric: Mood and affect are normal. Speech  and behavior are normal.  ____________________________________________   LABS (all labs ordered are listed, but only abnormal results are displayed)  Results for orders placed or performed during the hospital encounter of 10/24/18 (from the past 24 hour(s))  hCG, quantitative, pregnancy     Status: None   Collection Time: 10/24/18  7:11 PM  Result Value Ref Range   hCG, Beta Chain, Quant, S <1 <5 mIU/mL  CBC  with Differential     Status: None   Collection Time: 10/24/18  7:11 PM  Result Value Ref Range   WBC 8.1 4.0 - 10.5 K/uL   RBC 4.58 3.87 - 5.11 MIL/uL   Hemoglobin 13.2 12.0 - 15.0 g/dL   HCT 65.739.9 84.636.0 - 96.246.0 %   MCV 87.1 80.0 - 100.0 fL   MCH 28.8 26.0 - 34.0 pg   MCHC 33.1 30.0 - 36.0 g/dL   RDW 95.211.8 84.111.5 - 32.415.5 %   Platelets 291 150 - 400 K/uL   nRBC 0.0 0.0 - 0.2 %   Neutrophils Relative % 49 %   Neutro Abs 4.0 1.7 - 7.7 K/uL   Lymphocytes Relative 39 %   Lymphs Abs 3.2 0.7 - 4.0 K/uL   Monocytes Relative 7 %   Monocytes Absolute 0.5 0.1 - 1.0 K/uL   Eosinophils Relative 4 %   Eosinophils Absolute 0.3 0.0 - 0.5 K/uL   Basophils Relative 1 %   Basophils Absolute 0.0 0.0 - 0.1 K/uL   Immature Granulocytes 0 %   Abs Immature Granulocytes 0.02 0.00 - 0.07 K/uL  Basic metabolic panel     Status: Abnormal   Collection Time: 10/24/18  7:11 PM  Result Value Ref Range   Sodium 138 135 - 145 mmol/L   Potassium 4.2 3.5 - 5.1 mmol/L   Chloride 100 98 - 111 mmol/L   CO2 29 22 - 32 mmol/L   Glucose, Bld 140 (H) 70 - 99 mg/dL   BUN 15 6 - 20 mg/dL   Creatinine, Ser 4.010.71 0.44 - 1.00 mg/dL   Calcium 9.3 8.9 - 02.710.3 mg/dL   GFR calc non Af Amer >60 >60 mL/min   GFR calc Af Amer >60 >60 mL/min   Anion gap 9 5 - 15  ABO/Rh     Status: None   Collection Time: 10/24/18  7:11 PM  Result Value Ref Range   ABO/RH(D)      A NEG Performed at [facility], [address] [place], [place] [address]    ____________________________________________  EKG My review and personal interpretation at Time: 0:24   Indication: chest pain  Rate: 90  Rhythm: sinus Axis: normal Other: normal intervals, no stemi ____________________________________________  RADIOLOGY   ____________________________________________   PROCEDURES  Procedure(s) performed:  Procedures    Critical Care performed: no ____________________________________________   INITIAL IMPRESSION / ASSESSMENT AND  PLAN / ED COURSE  Pertinent labs & imaging results that were available during my care of the patient were reviewed by me and considered in my medical decision making (see chart for details).   DDX: PCOS, ectopic, IUP, DU B, ACS, PE, dissection  Heath LarkCarrie Roettger is a 23 y.o. who presents to the ED with symptoms as described above.  Patient afebrile and hemodynamically stable.  Well-appearing and in no acute distress.  Buttock is reassuring.  No anemia.  Recommended pelvic exam but patient declined this stating that she would prefer to follow-up with OB/GYN.  Patient is low risk for PE by Wells criteria and is  PERC negative.  No evidence or signs to suggest ACS.  Not consistent with pericarditis.  Not consistent with dissection.  Her abdominal exam is soft and benign.  Doubt biliary pathology.  Does have some wheezing on exam therefore will treat for bronchitis.  Have discussed with the patient and available family all diagnostics and treatments performed thus far and all questions were answered to the best of my ability. The patient demonstrates understanding and agreement with plan.       As part of my medical decision making, I reviewed the following data within the electronic MEDICAL RECORD NUMBER Nursing notes reviewed and incorporated, Labs reviewed, notes from prior ED visits.   ____________________________________________   FINAL CLINICAL IMPRESSION(S) / ED DIAGNOSES  Final diagnoses:  Vaginal bleeding  Chest pain, unspecified type      NEW MEDICATIONS STARTED DURING THIS VISIT:  New Prescriptions   ALBUTEROL (PROVENTIL HFA;VENTOLIN HFA) 108 (90 BASE) MCG/ACT INHALER    Inhale 2 puffs into the lungs every 6 (six) hours as needed for wheezing or shortness of breath.   PREDNISONE (DELTASONE) 20 MG TABLET    Take 2 tablets (40 mg total) by mouth daily for 5 days.     Note:  This document was prepared using Dragon voice recognition software and may include unintentional dictation  errors.    Willy Eddy, MD 10/25/18 0030

## 2018-11-09 ENCOUNTER — Emergency Department: Payer: PRIVATE HEALTH INSURANCE

## 2018-11-09 ENCOUNTER — Emergency Department
Admission: EM | Admit: 2018-11-09 | Discharge: 2018-11-09 | Disposition: A | Discharge status: Home / Self Care | Payer: PRIVATE HEALTH INSURANCE | Attending: Emergency Medicine | Admitting: Emergency Medicine

## 2018-11-09 ENCOUNTER — Other Ambulatory Visit: Payer: Self-pay

## 2018-11-09 DIAGNOSIS — R102 Pelvic and perineal pain: Secondary | ICD-10-CM

## 2018-11-09 DIAGNOSIS — N979 Female infertility, unspecified: Secondary | ICD-10-CM | POA: Diagnosis not present

## 2018-11-09 DIAGNOSIS — N938 Other specified abnormal uterine and vaginal bleeding: Secondary | ICD-10-CM | POA: Insufficient documentation

## 2018-11-09 DIAGNOSIS — J45909 Unspecified asthma, uncomplicated: Secondary | ICD-10-CM | POA: Diagnosis not present

## 2018-11-09 LAB — URINALYSIS, COMPLETE (UACMP) WITH MICROSCOPIC
Bacteria, UA: NONE SEEN
Bilirubin Urine: NEGATIVE
Glucose, UA: 50 mg/dL — AB
Ketones, ur: NEGATIVE mg/dL
Nitrite: NEGATIVE
Protein, ur: 100 mg/dL — AB
RBC / HPF: >50 RBC/hpf — ABNORMAL HIGH (ref 0–5)
Specific Gravity, Urine: 1.02 (ref 1.005–1.030)
WBC, UA: >50 WBC/hpf — ABNORMAL HIGH (ref 0–5)
pH: 6 (ref 5.0–8.0)

## 2018-11-09 LAB — BASIC METABOLIC PANEL
Anion gap: 7 (ref 5–15)
BUN: 12 mg/dL (ref 6–20)
CO2: 27 mmol/L (ref 22–32)
Calcium: 8.6 mg/dL — ABNORMAL LOW (ref 8.9–10.3)
Chloride: 102 mmol/L (ref 98–111)
Creatinine, Ser: 0.85 mg/dL (ref 0.44–1.00)
GFR calc Af Amer: >60 mL/min (ref >60)
GFR calc non Af Amer: >60 mL/min (ref >60)
Glucose, Bld: 226 mg/dL — ABNORMAL HIGH (ref 70–99)
Potassium: 3.5 mmol/L (ref 3.5–5.1)
Sodium: 136 mmol/L (ref 135–145)

## 2018-11-09 LAB — CBC
HCT: 37.5 % (ref 36.0–46.0)
Hemoglobin: 12.3 g/dL (ref 12.0–15.0)
MCH: 28.9 pg (ref 26.0–34.0)
MCHC: 32.8 g/dL (ref 30.0–36.0)
MCV: 88.2 fL (ref 80.0–100.0)
Platelets: 280 10*3/uL (ref 150–400)
RBC: 4.25 MIL/uL (ref 3.87–5.11)
RDW: 12.2 % (ref 11.5–15.5)
WBC: 9.4 10*3/uL (ref 4.0–10.5)
nRBC: 0 % (ref 0.0–0.2)

## 2018-11-09 LAB — POCT PREGNANCY, URINE: Preg Test, Ur: NEGATIVE

## 2018-11-09 MED ORDER — IBUPROFEN 800 MG PO TABS: 800.0000 mg | ORAL_TABLET | Freq: Three times a day (TID) | ORAL | 0 refills | Status: DC | PRN

## 2018-11-09 MED ORDER — SODIUM CHLORIDE 0.9 % IV BOLUS
1000.0000 mL | Freq: Once | INTRAVENOUS | Status: AC
  Administered 2018-11-09: 1000 mL via INTRAVENOUS

## 2018-11-09 NOTE — ED Notes (Signed)
Pt is given a warm blanket.   

## 2018-11-09 NOTE — Discharge Instructions (Addendum)
You may take Tylenol or Motrin for your pain.  Please make an appointment with an OB/GYN to discuss your vaginal bleeding, your polycystic ovarian syndrome, and your wish to conceive a baby.  You may return to the emergency department if you develop severe pain, lightheadedness or fainting, fever, vomiting, or any other symptoms concerning to you.

## 2018-11-09 NOTE — ED Provider Notes (Signed)
[facility] Emergency Department Provider Note  ____________________________________________  Time seen: Approximately 8:36 PM  I have reviewed the triage vital signs and the nursing notes.   HISTORY  Chief Complaint Abdominal Pain and Vaginal Bleeding    HPI Leslie Allison is a 23 y.o. female, nonpregnant, with a history of PCOS presenting with severe suprapubic pelvic pain.  The patient was seen here 10/25/2018 for 95 days of vaginal spotting and declined pelvic examination.  It was recommended that she follow-up with an OB/GYN, but she did not do so.  Today, the patient states that she continues to have vaginal spotting, and she developed a severe pelvic pain that caused her to fall to the ground.  She did not have a syncopal episode.  She did have some blood clots.  Her husband changes her tampons as she is unable to reach due to her obesity and states that he has noted blood clots over the last several weeks.  She denies any chest pain, shortness of breath, syncope.  She did not injure herself when she fell.  She denies any change in vaginal discharge and is sexually active with her husband of 2 years.  They are desiring a pregnancy.  Past Medical History:  Diagnosis Date  . Amenorrhea   . Asthma     Patient Active Problem List   Diagnosis Date Noted  . PCO (polycystic ovaries) 12/10/2016  . Vaginal discharge 11/21/2016  . Amenorrhea 07/18/2016  . Morbid obesity with BMI of 45.0-49.9, adult (HCC) 07/18/2016  . Mentally challenged 07/18/2016  . Family history of breast cancer in mother 07/18/2016    Past Surgical History:  Procedure Laterality Date  . NO PAST SURGERIES      Current Outpatient Rx  . Order #: [id number] Class: Normal  . Order #: [id number] Class: Print    Allergies Amoxicillin  Family History  Problem Relation Age of Onset  . Breast cancer Mother   . Ovarian cancer Neg Hx   . Colon cancer Neg Hx   . Diabetes Neg Hx   . Heart  disease Neg Hx     Social History Social History   Tobacco Use  . Smoking status: Never Smoker  . Smokeless tobacco: Never Used  Substance Use Topics  . Alcohol use: No  . Drug use: No    Review of Systems Constitutional: No fever/chills.  No lightheadedness or syncope. Eyes: No visual changes. ENT: No sore throat. No congestion or rhinorrhea. Cardiovascular: Denies chest pain. Denies palpitations. Respiratory: Denies shortness of breath.  No cough. Gastrointestinal: No abdominal pain.  No nausea, no vomiting.  No diarrhea.  No constipation. Genitourinary: Negative for dysuria.  No change in vaginal discharge.  Ongoing vaginal bleeding with occasional clots. Musculoskeletal: Negative for back pain. Skin: Negative for rash. Neurological: Negative for headaches. No focal numbness, tingling or weakness.     ____________________________________________   PHYSICAL EXAM:  VITAL SIGNS: ED Triage Vitals  Enc Vitals Group     BP 11/09/18 1936 (!) 140/104     Pulse Rate 11/09/18 1936 (!) 113     Resp 11/09/18 1936 18     Temp 11/09/18 1936 99.4 F (37.4 C)     Temp Source 11/09/18 1936 Oral     SpO2 11/09/18 1936 99 %     Weight 11/09/18 1937 (!) 341 lb 11.4 oz (155 kg)     Height 11/09/18 1937 5\' 2"  (1.575 m)     Head Circumference --  Peak Flow --      Pain Score 11/09/18 1937 5     Pain Loc --      Pain Edu? --      Excl. in GC? --     Constitutional: Alert and oriented. Answers questions appropriately. Eyes: Conjunctivae are normal without pallor.  EOMI. No scleral icterus. Head: Atraumatic. Nose: No congestion/rhinnorhea. Mouth/Throat: Mucous membranes are moist.  Neck: No stridor.  Supple.  No JVD.  No meningismus. Cardiovascular: Normal rate, regular rhythm. No murmurs, rubs or gallops.  Respiratory: Normal respiratory effort.  No accessory muscle use or retractions. Lungs CTAB.  No wheezes, rales or ronchi. Gastrointestinal: Morbidly obese.  Soft,  nontender and nondistended.  No guarding or rebound.  No peritoneal signs. GU: Deferred as the patient prefers to do this with OB/GYN.  However, will get an ultrasound. Musculoskeletal: No LE edema.  Neurologic:  A&Ox3.  Speech is clear.  Face and smile are symmetric.  EOMI.  Moves all extremities well. Skin:  Skin is warm, dry and intact. No rash noted. Psychiatric: Mood and affect are normal. Speech and behavior are normal.  Normal judgement.  ____________________________________________   LABS (all labs ordered are listed, but only abnormal results are displayed)  Labs Reviewed  BASIC METABOLIC PANEL - Abnormal; Notable for the following components:      Result Value   Glucose, Bld 226 (*)    Calcium 8.6 (*)    All other components within normal limits  CBC  URINALYSIS, COMPLETE (UACMP) WITH MICROSCOPIC  POC URINE PREG, ED  POCT PREGNANCY, URINE   ____________________________________________  EKG  Not indicated ____________________________________________  RADIOLOGY  [place] Pelvis Transvanginal Non-ob (tv Only)  Result Date: 11/09/2018 CLINICAL DATA:  Severe pelvic pain EXAM: TRANSABDOMINAL AND TRANSVAGINAL ULTRASOUND OF PELVIS DOPPLER ULTRASOUND OF OVARIES TECHNIQUE: Both transabdominal and transvaginal ultrasound examinations of the pelvis were performed. Transabdominal technique was performed for global imaging of the pelvis including uterus, ovaries, adnexal regions, and pelvic cul-de-sac. It was necessary to proceed with endovaginal exam following the transabdominal exam to visualize the uterus, endometrium and ovaries. Color and duplex Doppler ultrasound was utilized to evaluate blood flow to the ovaries. COMPARISON:  None. FINDINGS: Uterus Measurements: 7.2 x 3.3 x 2.5 cm (volume = 31 cm^3). No fibroids or other mass visualized. Endometrium Thickness: 1 cm.  No focal abnormality visualized. Right ovary Measurements: 3.5 x 2.3 x 3.4 cm (volume = 14 cm^3). Normal appearance/no  adnexal mass. Left ovary Measurements: 3.4 x 3 x 3.1 cm (volume = 20 cm^3). Normal appearance/no adnexal mass. Pulsed Doppler evaluation of both ovaries demonstrates normal low-resistance arterial and venous waveforms. Other findings No abnormal free fluid. IMPRESSION: Unremarkable pelvic ultrasound. No adnexal mass. No ovarian torsion. Electronically Signed   By: Tollie Ethavid  Kwon M.D.   On: 11/09/2018 21:45   [place] Pelvis Complete  Result Date: 11/09/2018 CLINICAL DATA:  Severe pelvic pain EXAM: TRANSABDOMINAL AND TRANSVAGINAL ULTRASOUND OF PELVIS DOPPLER ULTRASOUND OF OVARIES TECHNIQUE: Both transabdominal and transvaginal ultrasound examinations of the pelvis were performed. Transabdominal technique was performed for global imaging of the pelvis including uterus, ovaries, adnexal regions, and pelvic cul-de-sac. It was necessary to proceed with endovaginal exam following the transabdominal exam to visualize the uterus, endometrium and ovaries. Color and duplex Doppler ultrasound was utilized to evaluate blood flow to the ovaries. COMPARISON:  None. FINDINGS: Uterus Measurements: 7.2 x 3.3 x 2.5 cm (volume = 31 cm^3). No fibroids or other mass visualized. Endometrium Thickness: 1 cm.  No focal  abnormality visualized. Right ovary Measurements: 3.5 x 2.3 x 3.4 cm (volume = 14 cm^3). Normal appearance/no adnexal mass. Left ovary Measurements: 3.4 x 3 x 3.1 cm (volume = 20 cm^3). Normal appearance/no adnexal mass. Pulsed Doppler evaluation of both ovaries demonstrates normal low-resistance arterial and venous waveforms. Other findings No abnormal free fluid. IMPRESSION: Unremarkable pelvic ultrasound. No adnexal mass. No ovarian torsion. Electronically Signed   By: Tollie Eth M.D.   On: 11/09/2018 21:45   US Pelvic Doppler (torsion R/o Or Mass Arterial Flow)  Result Date: 11/09/2018 CLINICAL DATA:  Severe pelvic pain EXAM: TRANSABDOMINAL AND TRANSVAGINAL ULTRASOUND OF PELVIS DOPPLER ULTRASOUND OF OVARIES TECHNIQUE:  Both transabdominal and transvaginal ultrasound examinations of the pelvis were performed. Transabdominal technique was performed for global imaging of the pelvis including uterus, ovaries, adnexal regions, and pelvic cul-de-sac. It was necessary to proceed with endovaginal exam following the transabdominal exam to visualize the uterus, endometrium and ovaries. Color and duplex Doppler ultrasound was utilized to evaluate blood flow to the ovaries. COMPARISON:  None. FINDINGS: Uterus Measurements: 7.2 x 3.3 x 2.5 cm (volume = 31 cm^3). No fibroids or other mass visualized. Endometrium Thickness: 1 cm.  No focal abnormality visualized. Right ovary Measurements: 3.5 x 2.3 x 3.4 cm (volume = 14 cm^3). Normal appearance/no adnexal mass. Left ovary Measurements: 3.4 x 3 x 3.1 cm (volume = 20 cm^3). Normal appearance/no adnexal mass. Pulsed Doppler evaluation of both ovaries demonstrates normal low-resistance arterial and venous waveforms. Other findings No abnormal free fluid. IMPRESSION: Unremarkable pelvic ultrasound. No adnexal mass. No ovarian torsion. Electronically Signed   By: Tollie Eth M.D.   On: 11/09/2018 21:45    ____________________________________________   PROCEDURES  Procedure(s) performed: None  Procedures  Critical Care performed: No ____________________________________________   INITIAL IMPRESSION / ASSESSMENT AND PLAN / ED COURSE  Pertinent labs & imaging results that were available during my care of the patient were reviewed by me and considered in my medical decision making (see chart for details).  23 y.o. pregnant female with a history of PCOS presenting with severe pelvic pain.  Overall, the patient initially was tachycardic and mildly hypertensive but at this time has normal vital signs and is afebrile.  We will get an ultrasound to evaluate for ovarian torsion or cyst rupture, for which she is at increased risk due to her PCOS.  Reviewed the patient's chart and seen her  pelvic ultrasound from 11/22/2016 which does show ovarian findings consistent with PCO S.  The patient states that her pain has resolved after receiving intravenous fluids, and I have encouraged her to let us know if her pain returns.  Also get a UA.  Plan reevaluation for final disposition.  ----------------------------------------- 10:12 PM on 11/09/2018 -----------------------------------------  The patient's work-up in the emergency department has been reassuring.  Her pelvic ultrasound does not show any acute abnormalities, including ovarian cyst or torsion.  This time, the patient is safe for discharge home.  I encouraged her to make an appointment with an OB/GYN to discuss her dysfunctional uterine bleeding as well as her wish to conceive.  Follow-up instructions as well as return precautions were discussed.  ____________________________________________  FINAL CLINICAL IMPRESSION(S) / ED DIAGNOSES  Final diagnoses:  Pelvic pain  Infertility, female  Dysfunctional uterine bleeding         NEW MEDICATIONS STARTED DURING THIS VISIT:  New Prescriptions   IBUPROFEN (ADVIL,MOTRIN) 800 MG TABLET    Take 1 tablet (800 mg total) by mouth every 8 (eight)  hours as needed for moderate pain or cramping (with food).      Rockne MenghiniNorman, Anne-Caroline, MD 11/09/18 2216

## 2018-11-09 NOTE — ED Triage Notes (Signed)
Pt stated that she has had vaginal bleeding for the past 3 months and was seen 2 weeks ago and she was told everything was okay per patient. Pt stated that tonight she started having upper middle abd pain and she felt like she had to urinate but was not able today. Pt stated that she has went through about 5-6 tampons since this morning and she is passing large blood clots. Pt stated that she has not followed up with her GYN because she does not have her insurance card yet.

## 2018-11-16 ENCOUNTER — Ambulatory Visit (INDEPENDENT_AMBULATORY_CARE_PROVIDER_SITE_OTHER): Payer: PRIVATE HEALTH INSURANCE | Admitting: Obstetrics and Gynecology

## 2018-11-16 ENCOUNTER — Encounter: Payer: Self-pay | Admitting: Obstetrics and Gynecology

## 2018-11-16 VITALS — BP 132/88 | HR 100 | Ht 62.0 in | Wt 350.0 lb

## 2018-11-16 DIAGNOSIS — I10 Essential (primary) hypertension: Secondary | ICD-10-CM | POA: Diagnosis not present

## 2018-11-16 DIAGNOSIS — N926 Irregular menstruation, unspecified: Secondary | ICD-10-CM

## 2018-11-16 DIAGNOSIS — N939 Abnormal uterine and vaginal bleeding, unspecified: Secondary | ICD-10-CM

## 2018-11-16 DIAGNOSIS — Z6841 Body Mass Index (BMI) 40.0 and over, adult: Secondary | ICD-10-CM

## 2018-11-16 MED ORDER — NORETHIN-ETH ESTRAD-FE BIPHAS 1 MG-10 MCG / 10 MCG PO TABS: 1.0000 | ORAL_TABLET | Freq: Every day | ORAL | 11 refills | Status: DC

## 2018-11-16 MED ORDER — LABETALOL HCL 200 MG PO TABS: 200.0000 mg | ORAL_TABLET | Freq: Two times a day (BID) | ORAL | 3 refills | Status: DC

## 2018-11-16 NOTE — Patient Instructions (Signed)

## 2018-11-16 NOTE — Progress Notes (Signed)
Patient ID: Leslie Allison, female   DOB: January 16, 1996, 23 y.o.   MRN: [id number]  Reason for Consult: Menstrual Problem (periods randomly come, not heavy or painful just irregular, wants BC )   Referred by No ref. provider found  Subjective:     HPI:  Leslie Allison is a 23 y.o. female. She is following up today from a recent ER visit.  She was seen for irregular and heavy uterine bleeding.  She was seen for this similar problem in August.  At that time she was given a menstrual calendar which she was asked to complete and then return for visit.  She had not followed up.  She has difficulty quantifying the number of days a month that she has bleeding for and describing her bleeding pattern.  She denies heavy vaginal bleeding she denies painful uterine cramping.  She is accompanied today by her husband.  Her husband reports that he changes her tampons for her because she cannot reach.  She would like to start birth control to help regulate her periods.  She does desire pregnancy in the future but she understands that it would be advisable for her to lose weight and obtain a more reasonable body mass index before becoming pregnant.      Past Medical History:  Diagnosis Date  . Amenorrhea   . Asthma    Family History  Problem Relation Age of Onset  . Breast cancer Mother   . Ovarian cancer Neg Hx   . Colon cancer Neg Hx   . Diabetes Neg Hx   . Heart disease Neg Hx    Past Surgical History:  Procedure Laterality Date  . NO PAST SURGERIES      Short Social History:  Social History   Tobacco Use  . Smoking status: Never Smoker  . Smokeless tobacco: Never Used  Substance Use Topics  . Alcohol use: No    Allergies  Allergen Reactions  . Amoxicillin     Has patient had a PCN reaction causing immediate rash, facial/tongue/throat swelling, SOB or lightheadedness with hypotension: No Has patient had a PCN reaction causing severe rash involving mucus membranes or skin necrosis:  No Has patient had a PCN reaction that required hospitalization: No Has patient had a PCN reaction occurring within the last 10 years: No If all of the above answers are "NO", then may proceed with Cephalosporin use.      Current Outpatient Medications  Medication Sig Dispense Refill  . albuterol (PROVENTIL HFA;VENTOLIN HFA) 108 (90 Base) MCG/ACT inhaler Inhale 2 puffs into the lungs every 6 (six) hours as needed for wheezing or shortness of breath. 1 Inhaler 2  . ibuprofen (ADVIL,MOTRIN) 800 MG tablet Take 1 tablet (800 mg total) by mouth every 8 (eight) hours as needed for moderate pain or cramping (with food). 15 tablet 0  . labetalol (NORMODYNE) 200 MG tablet Take 1 tablet (200 mg total) by mouth 2 (two) times daily. 60 tablet 3  . Norethindrone-Ethinyl Estradiol-Fe Biphas (LO LOESTRIN FE) 1 MG-10 MCG / 10 MCG tablet Take 1 tablet by mouth daily. 1 Package 11   No current facility-administered medications for this visit.     Review of Systems  Constitutional: Negative for chills, fatigue, fever and unexpected weight change.  HENT: Negative for trouble swallowing.  Eyes: Negative for loss of vision.  Respiratory: Negative for cough, shortness of breath and wheezing.  Cardiovascular: Negative for chest pain, leg swelling, palpitations and syncope.  GI: Negative for abdominal pain,  blood in stool, diarrhea, nausea and vomiting.  GU: Negative for difficulty urinating, dysuria, frequency and hematuria.  Musculoskeletal: Negative for back pain, leg pain and joint pain.  Skin: Negative for rash.  Neurological: Negative for dizziness, headaches, light-headedness, numbness and seizures.  Psychiatric: Negative for behavioral problem, confusion, depressed mood and sleep disturbance.        Objective:  Objective   Vitals:   11/16/18 1023  BP: 132/88  Pulse: 100  Weight: (!) 350 lb (158.8 kg)  Height: 5\' 2"  (1.575 m)   Body mass index is 64.02 kg/m.  Physical Exam Vitals signs  and nursing note reviewed.  Constitutional:      Appearance: She is well-developed.  HENT:     Head: Normocephalic and atraumatic.  Eyes:     Pupils: Pupils are equal, round, and reactive to light.  Cardiovascular:     Rate and Rhythm: Normal rate and regular rhythm.  Pulmonary:     Effort: Pulmonary effort is normal. No respiratory distress.  Skin:    General: Skin is warm and dry.  Neurological:     Mental Status: She is alert and oriented to person, place, and time.  Psychiatric:        Behavior: Behavior normal.        Thought Content: Thought content normal.        Judgment: Judgment normal.         Assessment/Plan:     23 yo with abnormal uterine bleeding.  Was last seen in August. She was given an menstrual calendar at that time but has not been using it to track her periods. Recently she was seen in the ER with heavy bleeding. She had an US performed in the ER where her endometrium measured 1 cm. She is following up today from that visit   Declines biopsy today. Declines pelvic examination.  Will biopsy if bleeding does not improve with OCPs.  Declines hysteroscopy D&C and IUD placement in the OR.  Hypertensive, will start BP medicine today. She will need to follow with a PCP for treatment and long term management. Hgba1c today PCOS labs today.  Referral to nutrition and internal medicine.  Given a new menstrual calendar to track her periods.  Will need goal setting for weight loss. Patient feels motivated but has not lost weight since previous visit.   More than 45 minutes were spent face to face with the patient in the room with more than 50% of the time spent providing counseling and discussing the plan of management.   Adelene Idlerhristanna Jacere Pangborn MD [facility], [facility] 11/16/2018 11:19 AM

## 2018-11-19 LAB — HEMOGLOBIN A1C
Est. average glucose Bld gHb Est-mCnc: 166 mg/dL
Hgb A1c MFr Bld: 7.4 % — ABNORMAL HIGH (ref 4.8–5.6)

## 2018-11-19 LAB — TSH+PRL+FSH+TESTT+LH+DHEA S...
17-Hydroxyprogesterone: 37 ng/dL
Androstenedione: 223 ng/dL (ref 41–262)
DHEA SO4: 284.1 ug/dL (ref 110.0–431.7)
FSH: 4 m[IU]/mL
LH: 5.4 m[IU]/mL
Prolactin: 18.1 ng/mL (ref 4.8–23.3)
TESTOSTERONE FREE: 8.1 pg/mL — AB (ref 0.0–4.2)
TSH: 6.66 u[IU]/mL — ABNORMAL HIGH (ref 0.450–4.500)
Testosterone: 60 ng/dL — ABNORMAL HIGH (ref 8–48)

## 2018-11-20 NOTE — Progress Notes (Signed)
Called and discussed with patient on phone, will need T3 and T4 at next visit in 10 days. Will need to establish with PCP for diabetes management.

## 2018-11-30 ENCOUNTER — Encounter: Payer: Self-pay | Admitting: Obstetrics and Gynecology

## 2018-11-30 ENCOUNTER — Other Ambulatory Visit (HOSPITAL_COMMUNITY)
Admission: RE | Admit: 2018-11-30 | Discharge: 2018-11-30 | Disposition: A | Discharge status: Home / Self Care | Payer: PRIVATE HEALTH INSURANCE | Source: Ambulatory Visit | Attending: Obstetrics and Gynecology | Admitting: Obstetrics and Gynecology

## 2018-11-30 ENCOUNTER — Ambulatory Visit (INDEPENDENT_AMBULATORY_CARE_PROVIDER_SITE_OTHER): Payer: PRIVATE HEALTH INSURANCE | Admitting: Obstetrics and Gynecology

## 2018-11-30 VITALS — BP 122/80 | HR 94 | Ht 62.0 in | Wt 347.0 lb

## 2018-11-30 DIAGNOSIS — R739 Hyperglycemia, unspecified: Secondary | ICD-10-CM

## 2018-11-30 DIAGNOSIS — E088 Diabetes mellitus due to underlying condition with unspecified complications: Secondary | ICD-10-CM

## 2018-11-30 DIAGNOSIS — R7309 Other abnormal glucose: Secondary | ICD-10-CM

## 2018-11-30 DIAGNOSIS — Z124 Encounter for screening for malignant neoplasm of cervix: Secondary | ICD-10-CM

## 2018-11-30 DIAGNOSIS — N939 Abnormal uterine and vaginal bleeding, unspecified: Secondary | ICD-10-CM | POA: Insufficient documentation

## 2018-11-30 DIAGNOSIS — R7989 Other specified abnormal findings of blood chemistry: Secondary | ICD-10-CM | POA: Diagnosis not present

## 2018-11-30 NOTE — Progress Notes (Signed)
Patient ID: Leslie Allison, female   DOB: March 26, 1996, 23 y.o.   MRN: [id number]  Reason for Consult: Gynecologic Exam   Referred by No ref. provider found  Subjective:     HPI:  Leslie Allison is a 23 y.o. female. She presents today for follow up. She was seen and evaluated 2 weeks ago for irregular bleeding. She started OCPs. She reports that with the OCP her bleeding has improved but she is still having persistent daily bleeding.  She has never had a pap smear before.    Past Medical History:  Diagnosis Date  . Amenorrhea   . Asthma   . Diabetes mellitus without complication (HCC)   . Hypothyroidism   . Thyroid disease    Family History  Problem Relation Age of Onset  . Breast cancer Mother   . Ovarian cancer Neg Hx   . Colon cancer Neg Hx   . Diabetes Neg Hx   . Heart disease Neg Hx    Past Surgical History:  Procedure Laterality Date  . NO PAST SURGERIES      Short Social History:  Social History   Tobacco Use  . Smoking status: Never Smoker  . Smokeless tobacco: Never Used  Substance Use Topics  . Alcohol use: No    Allergies  Allergen Reactions  . Amoxicillin     Has patient had a PCN reaction causing immediate rash, facial/tongue/throat swelling, SOB or lightheadedness with hypotension: No Has patient had a PCN reaction causing severe rash involving mucus membranes or skin necrosis: No Has patient had a PCN reaction that required hospitalization: No Has patient had a PCN reaction occurring within the last 10 years: No If all of the above answers are "NO", then may proceed with Cephalosporin use.      Current Outpatient Medications  Medication Sig Dispense Refill  . albuterol (PROVENTIL HFA;VENTOLIN HFA) 108 (90 Base) MCG/ACT inhaler Inhale 2 puffs into the lungs every 6 (six) hours as needed for wheezing or shortness of breath. 1 Inhaler 2  . ibuprofen (ADVIL,MOTRIN) 800 MG tablet Take 1 tablet (800 mg total) by mouth every 8 (eight) hours as  needed for moderate pain or cramping (with food). 15 tablet 0  . labetalol (NORMODYNE) 200 MG tablet Take 1 tablet (200 mg total) by mouth 2 (two) times daily. 60 tablet 3  . Norethindrone-Ethinyl Estradiol-Fe Biphas (LO LOESTRIN FE) 1 MG-10 MCG / 10 MCG tablet Take 1 tablet by mouth daily. 1 Package 11   No current facility-administered medications for this visit.     REVIEW OF SYSTEMS      Objective:  Objective   Vitals:   11/30/18 0853  BP: 122/80  Pulse: 94  Weight: (!) 347 lb (157.4 kg)  Height: 5\' 2"  (1.575 m)   Body mass index is 63.47 kg/m.  Physical Exam Vitals signs and nursing note reviewed.  Constitutional:      Appearance: She is well-developed.  HENT:     Head: Normocephalic and atraumatic.  Eyes:     Pupils: Pupils are equal, round, and reactive to light.  Cardiovascular:     Rate and Rhythm: Normal rate and regular rhythm.  Pulmonary:     Effort: Pulmonary effort is normal. No respiratory distress.  Genitourinary:    General: Normal vulva.     Vagina: Normal. No foreign body.     Cervix: Normal.     Uterus: Normal.      Comments: Exam limited by body habitus.  Cervix grossly  normal.  Significant amount for dark to bright red blood present on exam about 15cc. Skin:    General: Skin is warm and dry.  Neurological:     Mental Status: She is alert and oriented to person, place, and time.  Psychiatric:        Behavior: Behavior normal.        Thought Content: Thought content normal.        Judgment: Judgment normal.      Endometrial Biopsy After discussion with the patient regarding her abnormal uterine bleeding I recommended that she proceed with an endometrial biopsy for further diagnosis. The risks, benefits, alternatives, and indications for an endometrial biopsy were discussed with the patient in detail. She understood the risks including infection, bleeding, cervical laceration and uterine perforation.  Verbal consent was obtained.    PROCEDURE NOTE:  Pipelle endometrial biopsy was performed using aseptic technique with iodine preparation.  The uterus was sounded to a length of 8 cm.  Adequate sampling was obtained with minimal blood loss.  The patient tolerated the procedure well.  Disposition will be pending pathology.      Assessment/Plan:     23 yo with irregular bleeding.   EMB today for persistent irregular uterine bleeding despite hormone therapy and the presence of risk factors for endometrial hyperplasia and malignancy of obesity and irregular menses. Continue with OCP at this time.  Further treatment will be determined based on EMB results.   BP has improved on labetalol. Given recent diabetes diagnosis it may be considered to switch BP medication, but will defer to PCP for this decision  Likely Hypothyroid, T3 and T4 testing today.   Diabetes- Will likely need to start metformin. Would assume that this is type 2 diabetes, but given her young age will order c-peptide lab today to rule out type 1 diabetes.   Will perform additional testing today.  Will need to repeat testosertone levels in several months.   More than 25 minutes were spent face to face with the patient in the room with more than 50% of the time spent providing counseling and discussing the plan of management.    Adelene Idler MD [facility], [facility] 11/30/2018 6:30 PM

## 2018-12-01 LAB — THYROID PANEL WITH TSH
Free Thyroxine Index: 1.3 (ref 1.2–4.9)
T3 UPTAKE RATIO: 16 % — AB (ref 24–39)
T4, Total: 8.1 ug/dL (ref 4.5–12.0)
TSH: 6.96 u[IU]/mL — ABNORMAL HIGH (ref 0.450–4.500)

## 2018-12-01 LAB — C-PEPTIDE: C PEPTIDE: 6.6 ng/mL — AB (ref 1.1–4.4)

## 2018-12-03 LAB — CYTOLOGY - PAP: Diagnosis: NEGATIVE

## 2018-12-07 ENCOUNTER — Encounter: Payer: PRIVATE HEALTH INSURANCE | Attending: Obstetrics and Gynecology | Admitting: Dietician

## 2018-12-07 ENCOUNTER — Encounter: Payer: Self-pay | Admitting: Dietician

## 2018-12-07 VITALS — Ht 62.0 in | Wt 349.6 lb

## 2018-12-07 DIAGNOSIS — I10 Essential (primary) hypertension: Secondary | ICD-10-CM | POA: Insufficient documentation

## 2018-12-07 DIAGNOSIS — Z6841 Body Mass Index (BMI) 40.0 and over, adult: Secondary | ICD-10-CM | POA: Diagnosis not present

## 2018-12-07 NOTE — Progress Notes (Signed)
Medical Nutrition Therapy: Visit start time: 0930  end time: 1030  Assessment:  Diagnosis: obesity Past medical history: HTN, hypothyroidism, diabetes Psychosocial issues/ stress concerns: patient feels she is not dealing well with stress, does list watching videos and writing as her stress management.   Preferred learning method:  . Auditory . Visual . Hands-on  Current weight: 349.6lbs with shoes and sweatshirt  Height: 5'2" Medications, supplements: reconciled list in medical record  Progress and evaluation: Patient reports some weight fluctuation within 5-10lbs. She has been making some changes to reduce sugar intake. No previous dieting. Did lose weight as a teenager after stopping depo-provera, and low thyroid function then improved-- was down to about 140lbs. She states she eats much less -- skips meals-- when stress level is high. She reports small food portions, states her stomach becomes upset when she eats any large portions. Patient reports little to no activity during menstrual periods due to cramping pain. She also reports experiencing frequent sleep disruption during the night.   Physical activity: none at this time. No restrictions per patient.  Dietary Intake:  Usual eating pattern includes 1-3 meals and 1 snacks per day. Dining out frequency: 2-3 meals per week.  Breakfast: sometimes skips due to lack of hunger; usually special K cereal when hungry Snack: none Lunch: sometimes skips due to lack of hunger; usually sandwich with ham and [place] with pepper jack cheese, sandwich only, no chips Snack: none Supper: pasta with chicken, pork chop or similar, does eat potatoes, beans, veg Snack: sometimes ice cream Beverages: water, 8+ cups daily, sprite, orange juice  Nutrition Care Education: Topics covered: weight control Basic nutrition: basic food groups, appropriate nutrient balance, appropriate meal and snack schedule, general nutrition guidelines    Weight control:  benefits of weight control, eating at regular intervals, effects of "undereating", skipping meals; used food models to discuss food portions; reducing sugar intake including sugar-sweetened beverages; role of physical activity and options for increasing daily activity, benefits of finding suitable and enjoyable activities; benefits of tracking food intake and activity.  Diabetes:  appropriate meal and snack schedule, appropriate carb intake and balance, benefits of physical activity and weight loss. Hypertension: benefits of weight loss and physical activity   Nutritional Diagnosis:  [place]-3.3 Overweight/obesity As related to excess calories, inactivity, hypothyroidism.  As evidenced by patient wtih current BMI of 63.9.  Intervention:   Instruction as noted above.  Patient reports eating small portions based on comparison with food models.  Encouraged increasing caloric intake earlier in the day, at regular intervals, to avoid excess evening calories, as weill as incremental increase in physical activity.   Established goals with input from patient.   Education Materials given:  . Plate Planner with food lists . Sample menus . Snacking handout . Goals/ instructions . Exercise booklet (from ADA)  Learner/ who was taught:  . Patient  . Spouse/ partner   Level of understanding: Marland Kitchen Verbalizes/ demonstrates competency   Demonstrated degree of understanding via:   Teach back Learning barriers: . None  Willingness to learn/ readiness for change: . Acceptance, ready for change   Monitoring and Evaluation:  Dietary intake, exercise, BG control, thyroid labs, and body weight      follow up: 01/04/19

## 2018-12-07 NOTE — Patient Instructions (Signed)
   Eat a small meal or snack every 4-5 hours during the day. Use menus and snack ideas for good options.   Reduce the amount of sprite and/or juice you drink daily. Try a light or sugar free or diet version for less calories.   Start keeping a diary of what and how much you eat each day and how much you exercise.   Work to increase daily physical activity by getting up and moving for about 10 minutes every hour you are sitting down. You can also take a walk, or do house chores more often, move/ dance to music on the radio, and other activities.

## 2018-12-21 ENCOUNTER — Encounter: Payer: Self-pay | Admitting: Family Medicine

## 2018-12-21 ENCOUNTER — Ambulatory Visit (INDEPENDENT_AMBULATORY_CARE_PROVIDER_SITE_OTHER): Payer: PRIVATE HEALTH INSURANCE | Admitting: Family Medicine

## 2018-12-21 VITALS — BP 118/76 | HR 90 | Temp 97.8°F | Resp 18 | Ht 62.5 in | Wt 351.1 lb

## 2018-12-21 DIAGNOSIS — R7989 Other specified abnormal findings of blood chemistry: Secondary | ICD-10-CM | POA: Diagnosis not present

## 2018-12-21 DIAGNOSIS — F79 Unspecified intellectual disabilities: Secondary | ICD-10-CM

## 2018-12-21 DIAGNOSIS — Z6841 Body Mass Index (BMI) 40.0 and over, adult: Secondary | ICD-10-CM

## 2018-12-21 DIAGNOSIS — R7309 Other abnormal glucose: Secondary | ICD-10-CM | POA: Diagnosis not present

## 2018-12-21 DIAGNOSIS — N926 Irregular menstruation, unspecified: Secondary | ICD-10-CM

## 2018-12-21 NOTE — Progress Notes (Signed)
New Patient Office Visit  Subjective:  Patient ID: Leslie Allison, female    DOB: 04-05-1996  Age: 23 y.o. MRN: [id number]  CC:  Chief Complaint  Patient presents with  . Establish Care    patient has never had a family doctor    HPI Leslie Allison presents to establish care.  She has not been seen by PCP as an adult.  She has been seeing GYN for primary care.  Hypothyroidism: Noted by GYN, she is returning to them for management at this time.  Diabetes/Elevated A1C: Last A1C was 7.4%, C-peptide is mildly elevated.  GYN is currently managing, and no medication has been started at this time.  She is seeing nutritionist with [facility].  Denies polyphagia.  Endorses some polyuria and polydipsia.   Morbid Obesity: She saw nutritionist 12/07/2018 and this went well.  She is not exercising.  She experiences back pain when she walks for an extended length of time.  Irregular Uterine Bleeding: She is being managed by GYN, on Lo-Loestrin and this has stopped her bleeding at this time.   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  She notes learning disabilities as a child, but is unsure of what type.    Past Medical History:  Diagnosis Date  . Amenorrhea   . Asthma   . Diabetes mellitus without complication (HCC)   . Hypothyroidism   . Thyroid disease     Past Surgical History:  Procedure Laterality Date  . NO PAST SURGERIES      Family History  Problem Relation Age of Onset  . Breast cancer Mother   . Ovarian cancer Neg Hx   . Colon cancer Neg Hx   . Diabetes Neg Hx   . Heart disease Neg Hx     Social History   Socioeconomic History  . Marital status: Married    Spouse name: Leslie Allison  . Number of children: 0  . Years of education: Not on file  . Highest education level: High school graduate  Occupational History  . Occupation: unemployed    Comment: currently applying for disability  Social Needs  . Financial resource strain:  Not hard at all  . Food insecurity:    Worry: Never true    Inability: Never true  . Transportation needs:    Medical: No    Non-medical: No  Tobacco Use  . Smoking status: Never Smoker  . Smokeless tobacco: Never Used  Substance and Sexual Activity  . Alcohol use: No  . Drug use: No  . Sexual activity: Yes    Partners: Male    Birth control/protection: Pill  Lifestyle  . Physical activity:    Days per week: 5 days    Minutes per session: 10 min  . Stress: Not at all  Relationships  . Social connections:    Talks on phone: More than three times a week    Gets together: Never    Attends religious service: Never    Active member of club or organization: No    Attends meetings of clubs or organizations: Never    Relationship status: Married  . Intimate partner violence:    Fear of current or ex partner: No    Emotionally abused: No    Physically abused: No    Forced sexual activity: No  Other Topics Concern  . Not on file  Social History Narrative  . Not on file    ROS Review of Systems  Constitutional:  Negative.  Negative for chills, fatigue, fever and unexpected weight change.  HENT: Negative.  Negative for congestion and sore throat.   Respiratory: Negative for cough and shortness of breath.   Cardiovascular: Negative for chest pain, palpitations and leg swelling.  Gastrointestinal: Negative for abdominal pain, constipation, diarrhea, nausea and vomiting.  Endocrine: Positive for polydipsia and polyuria. Negative for polyphagia.  Genitourinary: Negative.  Negative for vaginal bleeding.  Musculoskeletal: Negative.   Skin: Negative.   Neurological: Negative for light-headedness and headaches.  Hematological: Negative.   Psychiatric/Behavioral: Negative.   All other systems reviewed and are negative.   Objective:   Today's Vitals: BP 118/76 (BP Location: Left Arm, Patient Position: Sitting, Cuff Size: Large)   Pulse 90   Temp 97.8 F (36.6 C) (Oral)   Resp  18   Ht 5' 2.5" (1.588 m)   Wt (!) 351 lb 1.6 oz (159.3 kg)   SpO2 94%   BMI 63.19 kg/m   Physical Exam Vitals signs and nursing note reviewed.  Constitutional:      General: She is not in acute distress.    Appearance: She is well-developed. She is obese.  HENT:     Head: Normocephalic and atraumatic.     Right Ear: External ear normal.     Left Ear: External ear normal.     Nose: Nose normal.     Mouth/Throat:     Pharynx: No oropharyngeal exudate.  Eyes:     Conjunctiva/sclera: Conjunctivae normal.     Pupils: Pupils are equal, round, and reactive to light.  Neck:     Musculoskeletal: Normal range of motion and neck supple.     Vascular: No JVD.  Cardiovascular:     Rate and Rhythm: Normal rate and regular rhythm.     Heart sounds: Normal heart sounds.  Pulmonary:     Effort: Pulmonary effort is normal.     Breath sounds: Normal breath sounds.  Abdominal:     General: Bowel sounds are normal.     Palpations: Abdomen is soft.  Musculoskeletal: Normal range of motion.        General: No tenderness.  Skin:    General: Skin is warm and dry.     Findings: No rash.  Neurological:     Mental Status: She is alert and oriented to person, place, and time.     Cranial Nerves: No cranial nerve deficit.  Psychiatric:        Behavior: Behavior normal.        Thought Content: Thought content normal.        Judgment: Judgment normal.     Assessment & Plan:   Problem List Items Addressed This Visit      Other   Morbid obesity with BMI of 45.0-49.9, adult (HCC)    Seeing nutritionist.       Mentally challenged    We will request pediatric notes to determine extent of challenge.      Irregular uterine bleeding    Taking OCP, working well to control bleeding      Abnormal thyroid blood test - Primary    Follow up testing was performed by GYN, we will defer to Dr. Jerene Pitch unless she prefers I assume care.  Patient will discuss with Dr. Jerene Pitch at her follow up in late  March 2020       Other Visit Diagnoses    Elevated hemoglobin A1c          Outpatient Encounter Medications as of  12/21/2018  Medication Sig  . albuterol (PROVENTIL HFA;VENTOLIN HFA) 108 (90 Base) MCG/ACT inhaler Inhale 2 puffs into the lungs every 6 (six) hours as needed for wheezing or shortness of breath.  Marland Kitchen ibuprofen (ADVIL,MOTRIN) 800 MG tablet Take 1 tablet (800 mg total) by mouth every 8 (eight) hours as needed for moderate pain or cramping (with food).  . labetalol (NORMODYNE) 200 MG tablet Take 1 tablet (200 mg total) by mouth 2 (two) times daily.  . Norethindrone-Ethinyl Estradiol-Fe Biphas (LO LOESTRIN FE) 1 MG-10 MCG / 10 MCG tablet Take 1 tablet by mouth daily.   No facility-administered encounter medications on file as of 12/21/2018.     Follow-up: Return in about 2 months (around 02/20/2019) for Schedule appointment if Dr. Jerene Pitch decides not to manage your diabetes/thyroid.Doren Custard, FNP

## 2018-12-21 NOTE — Assessment & Plan Note (Signed)
Follow up testing was performed by GYN, we will defer to Dr. Jerene Pitch unless she prefers I assume care.  Patient will discuss with Dr. Jerene Pitch at her follow up in late March 2020

## 2018-12-21 NOTE — Assessment & Plan Note (Signed)
Taking OCP, working well to control bleeding

## 2018-12-21 NOTE — Assessment & Plan Note (Signed)
Seeing nutritionist.

## 2018-12-21 NOTE — Assessment & Plan Note (Signed)
We will request pediatric notes to determine extent of challenge.

## 2018-12-28 ENCOUNTER — Ambulatory Visit: Payer: PRIVATE HEALTH INSURANCE | Admitting: Obstetrics and Gynecology

## 2019-01-04 ENCOUNTER — Other Ambulatory Visit: Payer: Self-pay

## 2019-01-04 ENCOUNTER — Encounter: Payer: Self-pay | Admitting: Dietician

## 2019-01-04 ENCOUNTER — Encounter: Payer: PRIVATE HEALTH INSURANCE | Attending: Obstetrics and Gynecology | Admitting: Dietician

## 2019-01-04 VITALS — Ht 62.0 in | Wt 351.5 lb

## 2019-01-04 DIAGNOSIS — Z6841 Body Mass Index (BMI) 40.0 and over, adult: Secondary | ICD-10-CM

## 2019-01-04 DIAGNOSIS — I10 Essential (primary) hypertension: Secondary | ICD-10-CM | POA: Insufficient documentation

## 2019-01-04 NOTE — Progress Notes (Signed)
Medical Nutrition Therapy: Visit start time: 0930  end time: 1030  Assessment:  Diagnosis: obesity Medical history changes: no changes Psychosocial issues/ stress concerns: patient feeling some increased stress helping sister get GED  Current weight: 351.5lbs  Height: 5'2" Medications, supplement changes: no changes  Progress and evaluation: Weight has increased slightly since previous visit, by 2lbs. Patient reports positive diet changes, including more homemade meals, less snacks/ "junk" foods, increased walking/ exercise. Husband is supportive with lifestyle changes.   Physical activity: walking 30 minutes 3x a week  Dietary Intake:  Usual eating pattern includes 2-3 meals and 1-2 snacks per day. Dining out frequency: not assessed today.  Breakfast: sometimes just milk, or milk + 1 slice whole wheat bread + fruit smoothie (strawberries, bananas, whole milk, ice) Snack: none or fruit Lunch: recently none due to helping sister with GED process Snack: fruit -- apple, strawberries, bananas; occasional small portion chips, jalapeno cheetos Supper: meat with mixed vegetables, tri-color pasta or fresh potatoes; vegetarian tacos (small amount of cheese) Snack: none or same as pm Beverages: water, green tea, fruit smoothies, some juices and gatorade, less sodas  Nutrition Care Education: Topics covered: weight control Basic nutrition: basic food groups, appropriate nutrient balance Weight control: importance of low fat and low sugar choices; appropriate food portions of starches and added fats; easy lunch options; role of protein in maintaining lean body weight and stabilizing blood sugars; increasing ADLs along with structured exercise  Advanced nutrition: healthy recipes   Nutritional Diagnosis:  [place]-3.3 Overweight/obesity As related to excess calories, inactivity, hypothyroidism.  As evidenced by patient with current BMI of 64, making lifestyle changes to promote weight  loss.  Intervention:   Instruction and discussion as noted above.  Patient is working with family support to restrict calories and increase activity, and reports feeling more energy.  Updated goals to further reduce sugar intake and control calories.   Education Materials given:  . Recipes . Goals/ instructions   Learner/ who was taught:  . Patient  . Spouse/ partner  Level of understanding: Marland Kitchen Verbalizes/ demonstrates competency   Demonstrated degree of understanding via:   Teach back Learning barriers: . None  Willingness to learn/ readiness for change: . Eager, change in progress  Monitoring and Evaluation:  Dietary intake, exercise, BG control, and body weight      follow up: 02/08/19

## 2019-01-04 NOTE — Patient Instructions (Signed)
   Great job making healthy changes, keep it up!!  Keep portions of starchy foods like rice, pasta, potatoes, beans, peas to 1 cup (fist-size)  Include a protein food with each meal. For example have low fat cheese or beans on tacos if you don't want meat. Or try some meat on the side, like grilled chicken.   Add some protein to fruit smoothies -- protein powder, or plain yogurt, or high protein milk such as fairlife brand.   Continue to exercise often, can also try moving for 5-10 minutes every hour.

## 2019-01-11 ENCOUNTER — Other Ambulatory Visit: Payer: Self-pay

## 2019-01-11 ENCOUNTER — Encounter: Payer: Self-pay | Admitting: Obstetrics and Gynecology

## 2019-01-11 ENCOUNTER — Ambulatory Visit (INDEPENDENT_AMBULATORY_CARE_PROVIDER_SITE_OTHER): Payer: PRIVATE HEALTH INSURANCE | Admitting: Obstetrics and Gynecology

## 2019-01-11 DIAGNOSIS — E038 Other specified hypothyroidism: Secondary | ICD-10-CM

## 2019-01-11 DIAGNOSIS — N926 Irregular menstruation, unspecified: Secondary | ICD-10-CM | POA: Diagnosis not present

## 2019-01-11 DIAGNOSIS — N939 Abnormal uterine and vaginal bleeding, unspecified: Secondary | ICD-10-CM

## 2019-01-11 DIAGNOSIS — E1169 Type 2 diabetes mellitus with other specified complication: Secondary | ICD-10-CM | POA: Diagnosis not present

## 2019-01-11 DIAGNOSIS — E669 Obesity, unspecified: Secondary | ICD-10-CM

## 2019-01-11 MED ORDER — LEVOTHYROXINE SODIUM 25 MCG PO TABS: 25.0000 ug | ORAL_TABLET | Freq: Every day | ORAL | 11 refills | Status: DC

## 2019-01-11 MED ORDER — METFORMIN HCL ER 750 MG PO TB24: 750.0000 mg | ORAL_TABLET | Freq: Two times a day (BID) | ORAL | 11 refills | Status: DC

## 2019-01-11 NOTE — Progress Notes (Signed)
Virtual Visit via Telephone Note  I connected with Leslie Allison on 01/11/19 at  9:10 AM EDT by telephone and verified that I am speaking with the correct person using two identifiers.   I discussed the limitations, risks, security and privacy concerns of performing an evaluation and management service by telephone and the availability of in person appointments. I also discussed with the patient that there may be a patient responsible charge related to this service. The patient expressed understanding and agreed to proceed.  The patient was at home I spoke with the patient from my  office The names of people involved in this encounter were: Leslie Allison and Dr. Jerene Pitch.   History of Present Illness: Patient is happy with bleeding pattern. She has been taking her OCP, but has missed some days. She can not tell me what day of the pill pack she is on. Her husband gives her the pills usually. She could not locate the pill pack in the house. She reports that since I saw her in February she has not had any vaginal bleeding. This is unusually since I would of expected her to have a period. I asked her to find the pill pack and call me back when she found it. She called back and reported that she had not taken the pills for 2 weeks. She had 17 days of the pills left. I asked her to start taking the pills today and be more consistent.    Observations/Objective:  Physical Exam could not be performed. Because of the COVID-19 outbreak this visit was performed over the phone and not in person.   Assessment and Plan: 23 yo with irregular menstrual bleeding. Pap smear and Endometrial biopsy normal Irregular pill taking. No bleeding since February.  Stressed the importance of regular pill taking.   Contacted PCP. Sent rx for meformin and synthroid medications that she recommended Metformin 750 XR BID and  Synthroid for 6 weeks then repeat labs.  Discussed with Leslie Allison and her husband on the phone.    Follow Up Instructions: Will follow up in 1 months to see if she has a period. Message sent to PCP about initiating thyroid and diabetes medications.     I discussed the assessment and treatment plan with the patient. The patient was provided an opportunity to ask questions and all were answered. The patient agreed with the plan and demonstrated an understanding of the instructions.   The patient was advised to call back or seek an in-person evaluation if the symptoms worsen or if the condition fails to improve as anticipated.  I provided 20 minutes of non-face-to-face time during this encounter.  Adelene Idler MD [facility], [facility] 01/11/2019 9:29 AM     Period expected Patient missing some pills.

## 2019-01-12 ENCOUNTER — Telehealth: Payer: Self-pay | Admitting: Family Medicine

## 2019-01-12 DIAGNOSIS — E039 Hypothyroidism, unspecified: Secondary | ICD-10-CM

## 2019-01-12 DIAGNOSIS — E1165 Type 2 diabetes mellitus with hyperglycemia: Secondary | ICD-10-CM

## 2019-01-12 NOTE — Telephone Encounter (Signed)
Left message for patient to call office.  

## 2019-01-12 NOTE — Telephone Encounter (Signed)
Received message from Dr. Jerene Pitch regarding patient.  She has diabetes and hypothyroidism.  She needs to start Metformin and synthroid.  Metformin 750mg  XR - take 1 tablet daily x7 days, then increase to 2 tablets twice daily.  She also needs to take Synthroid once daily - she must take this first thing in the morning with a glass of water 1 hour before eating or taking other medications.  Dr. Jerene Pitch also voice some concerns regarding her husband losing his job recently, so I am referring to the chronic care management team for diabetes/hypothyroid education and for social work consult.  She needs to come in for labs in 6 weeks and schedule a follow up that same week (virtual visit).

## 2019-01-18 ENCOUNTER — Ambulatory Visit: Payer: Self-pay

## 2019-01-18 DIAGNOSIS — E1165 Type 2 diabetes mellitus with hyperglycemia: Secondary | ICD-10-CM

## 2019-01-18 DIAGNOSIS — E039 Hypothyroidism, unspecified: Secondary | ICD-10-CM

## 2019-01-18 NOTE — Chronic Care Management (AMB) (Signed)
    Care Management   Unsuccessful Call Note 01/18/2019 Name: Leslie Allison MRN: [id number] DOB: [birth year]  [name] is a 23 year old female who sees Maurice Small, FNP for primary care. Ms. [name] asked the CCM team to consult the patient for chronic care management seconday to her DM, Thyroid Disease, and medication affordibility/management Referral was placed 01/12/2019. Patient's last office visit was 01/11/2019.     Was unable to reach patient via telephone today for introduction to CCM Services. I have left HIPAA compliant voicemail asking patient to return my call. (unsuccessful outreach #1).   Plan: Will follow-up within 7 business days via telephone.      Finnick Orosz E. Suzie Portela, RN, BSN Nurse Care Coordinator [facility] / [facility] Care Management  [phone]

## 2019-01-29 ENCOUNTER — Ambulatory Visit: Payer: Self-pay

## 2019-01-29 DIAGNOSIS — E039 Hypothyroidism, unspecified: Secondary | ICD-10-CM

## 2019-01-29 DIAGNOSIS — E1165 Type 2 diabetes mellitus with hyperglycemia: Secondary | ICD-10-CM

## 2019-01-29 DIAGNOSIS — E282 Polycystic ovarian syndrome: Secondary | ICD-10-CM

## 2019-01-29 NOTE — Patient Instructions (Signed)
1. Thank You for allowing the CCM (Chronic Care Management) Team to assist you with your healthcare goals!! We look forward to speaking with you on 02/04/2019 at 1:00 2. Please bring ALL medications to your appointment! If you have a blood sugar meter or a blood pressure monitor at home, bring those as well.  3.  Contact the CCM Team if you have any question or need to reschedule your initial visit.  CCM (Chronic Care Management) Team   Yvone Neu RN, BSN Nurse Care Coordinator  [phone]  Karalee Height PharmD  Clinical Pharmacist  [phone]   Verna Czech, LCSW Clinical Social Worker [phone]  Ms. Tippins was given information about Care Management services today including:  1. Case Management services includes personalized support from designated clinical staff supervised by her physician, including individualized plan of care and coordination with other care providers 2. 24/7 contact phone numbers for assistance for urgent and routine care needs. 3. The patient may stop case management services at any time by phone call to the office staff.  Patient agreed to services and verbal consent obtained.

## 2019-01-29 NOTE — Chronic Care Management (AMB) (Signed)
  Care Management   Note  01/29/2019 Name: Leslie Allison MRN: [id number] DOB: 1996/07/27  Leslie Allison is a 23 year old female who sees Maurice Small, FNP for primary care. Leslie Allison asked the CCM team to consult the patient for care coordination and chronic case management related to diabetes and hypothyroid disease self care. Patient also expressed concerned to Leslie Allison about finance and medication affordability but denies need for [employer] Social Work at this time. Will be happy to refer to SW if need arises. Patient has a history of but not limited to PCOS, Mentally challenged, and Morbid obesity. Referral was placed 12/21/2018 during last PCP visit. Telephone outreach to patient today to introduce CCM services.  SDOH (Social Determinants of Health) screening performed today. See Care Plan Entry related to challenges with: None  Leslie Allison was given information about Care Management services today including:  1. Case Management services include personalized support from designated clinical staff supervised by a physician, including individualized plan of care and coordination with other care providers 2. 24/7 contact phone numbers for assistance for urgent and routine care needs. 3. The patient may stop CCM services at any time (effective at the end of the month) by phone call to the office staff.   Patient agreed to services and verbal consent obtained.     Plan: Initial assessment for disease management scheduled for 02/04/2019 at 1:00    Leslie Allison E. Suzie Portela, RN, BSN Nurse Care Coordinator [facility] / [facility] Care Management  [phone]

## 2019-02-04 ENCOUNTER — Ambulatory Visit: Payer: Self-pay

## 2019-02-04 DIAGNOSIS — E1165 Type 2 diabetes mellitus with hyperglycemia: Secondary | ICD-10-CM

## 2019-02-04 DIAGNOSIS — E039 Hypothyroidism, unspecified: Secondary | ICD-10-CM

## 2019-02-04 NOTE — Chronic Care Management (AMB) (Signed)
  Care Management   Note  02/04/2019 Name: Leslie Allison MRN: [id number] DOB: 1996-03-27  Care Coordination: Successful telephone encounter to Leslie Allison, 23 year old female patient of Maurice Small, to reschedule initial health assessment previously scheduled for today 02/04/2019 at 1:00. Patient agrees to reschedule appointment and will be assessed via telephone encounter on 02/09/2019 at 1:00pm.    Macall Mccroskey E. Suzie Portela, RN, BSN Nurse Care Coordinator [facility] / [facility] Care Management  [phone]

## 2019-02-08 ENCOUNTER — Encounter: Payer: Self-pay | Admitting: Dietician

## 2019-02-08 ENCOUNTER — Encounter: Payer: PRIVATE HEALTH INSURANCE | Attending: Obstetrics and Gynecology | Admitting: Dietician

## 2019-02-08 ENCOUNTER — Other Ambulatory Visit: Payer: Self-pay

## 2019-02-08 VITALS — Ht 62.0 in | Wt 349.7 lb

## 2019-02-08 DIAGNOSIS — Z6841 Body Mass Index (BMI) 40.0 and over, adult: Secondary | ICD-10-CM | POA: Insufficient documentation

## 2019-02-08 DIAGNOSIS — I10 Essential (primary) hypertension: Secondary | ICD-10-CM | POA: Diagnosis not present

## 2019-02-08 NOTE — Progress Notes (Signed)
Medical Nutrition Therapy: Visit start time: 0930  end time: 1020  Assessment:  Diagnosis: obesity Medical history changes: HTN, hypothyroidism, PCOS Psychosocial issues/ stress concerns: high stress level  Current weight: 349.7lbs Height: 5'2" Medications, supplement changes: reconciled list in medical record  Progress and evaluation:   Weight loss of about 2lbs since previous visit on 01/04/19, same as weight at initial visit on 12/07/18.   Husband states patient seems in better mood recently, although some "down" feelings due to inability to conceive. Has been considering Noom plan (behavior modification plan for weight loss)  Patient reports using less sugar in fruit smoothies, and has changed type of milk she drinks.   Patient reports occasional symptoms of low BG, feels like she needs to consume sugar regularly to avoid feeling shaky.    Physical activity: walking 30 minutes up to 3 times a week.  Dietary Intake:  Usual eating pattern includes 2-3 meals and 3 snacks per day. Dining out frequency: 3-4 meals per week. Trying to eat more meals at home.  Breakfast: special K cereal with fairlife or silk milk or lactose free Snack: fruit Lunch: often none; occasionally husband cooks ie ramen noodles (2 pkgs) with homemade seasoning Snack: fruit ie strawberries, banana, apple, orange; occ hot cheetos Supper: out every other day; at home mashed potatoes and veg., asparagus with garlic butter + chicken + mashed potatoes, [place] toast; chalupa, veggie tacos ([place]); subway, [employer] Snack: fruit or cheetos Beverages: water, green tea, occ sweet tea, fruit smoothie (less sugar added), gatorade fruit punch or lemon lime  Nutrition Care Education: Topics covered: weight control, PCOS/ diabetes Basic nutrition: appropriate nutrient balance Weight control: benefits of weight control, reviewed importance of making low sugar and low fat food choices; importance of controlling portions  of starchy foods for caloric and blood sugar control; managind portions by eating slowly; increasing daily physical activity Advanced nutrition: dining out-- limiting cheese, rice, tortillas/ chips at [employer] Diabetes/ PCOS:  appropriate carb intake and balance, treating low BG symptoms, limiting carb intake and including protein sources to avoid wide BG fluctuations   Nutritional Diagnosis:  [place]-3.3 Overweight/obesity As related to excess calories, limited activity, hypothyroidism.  As evidenced by patient with current BMI of 63, and patient report of dietary intake and physical activity.  Intervention:   Instruction as noted above.  Patient has made a few changes to reduce sugar intake.   She seems to be eating some large portions at times, eating second portions with meals.  Updated goals to further reduce caloric intake.   Education Materials given:  Marland Kitchen Goals/ instructions   Learner/ who was taught:  . Patient  . Spouse/ partner  Level of understanding: Marland Kitchen Verbalizes/ demonstrates competency   Demonstrated degree of understanding via:   Teach back Learning barriers: . None  Willingness to learn/ readiness for change: . Eager, change in progress   Monitoring and Evaluation:  Dietary intake, exercise, and body weight      follow up: 03/15/19

## 2019-02-08 NOTE — Patient Instructions (Signed)
   Great job decreasing sugar and continued walking, keep it up! Increase the number of days you walk, or add other activity later in the day.   Drink sugar free drinks like Gatorade zero, tea with Splenda or Equal.   Concentrate on eating lean protein like chicken, shrimp, eggs, small amounts of cheese, extra lean ground beef, pork tenderloin.   Add low-carb veggies like asparagus, broccoli, green beans, etc. Limit portions of potatoes, rice, noodles, cereal, and bread.   Always think about keeping sugar and fat low in foods.   Eat slowly. Chew foods 20 times or more. Give yourself 20 minutes to feel full before deciding to eat seconds.

## 2019-02-09 ENCOUNTER — Ambulatory Visit: Payer: Self-pay

## 2019-02-09 DIAGNOSIS — E039 Hypothyroidism, unspecified: Secondary | ICD-10-CM

## 2019-02-09 DIAGNOSIS — E1165 Type 2 diabetes mellitus with hyperglycemia: Secondary | ICD-10-CM

## 2019-02-09 NOTE — Chronic Care Management (AMB) (Signed)
    Care Management   Unsuccessful Call Note 02/09/2019 Name: Leslie Allison MRN: [id number] DOB: Mar 11, 1996  Leslie Allison is a 23 year old femalewho sees Leslie Small, FNP for primary care. [name] the CCM team to consult the patient for chronic care management seconday to her DM, Thyroid Disease, and medication affordibility/management Referral was placed3/31/2020. Patient's last office visit was 01/11/2019. Today, patient was scheduled for an initial telephone assessment and goal setting at 1:00.  Was unable to reach patient via telephone today. Ihave left HIPAA compliant voicemail asking patient to return my call. (unsuccessful outreach #1).  Of note, it is documented in EMR that patient completed her appointment with RD 02/08/2019 of hypothyroid, DM, and obesity management.  Plan: Will follow-up within 7 business days via telephone.       Leslie Allison E. Leslie Portela, RN, BSN Nurse Care Coordinator [facility] / [facility] Care Management  [phone]

## 2019-02-11 ENCOUNTER — Other Ambulatory Visit: Payer: Self-pay

## 2019-02-11 ENCOUNTER — Ambulatory Visit (INDEPENDENT_AMBULATORY_CARE_PROVIDER_SITE_OTHER): Payer: PRIVATE HEALTH INSURANCE | Admitting: Obstetrics and Gynecology

## 2019-02-11 ENCOUNTER — Encounter: Payer: Self-pay | Admitting: Obstetrics and Gynecology

## 2019-02-11 DIAGNOSIS — N939 Abnormal uterine and vaginal bleeding, unspecified: Secondary | ICD-10-CM | POA: Diagnosis not present

## 2019-02-11 DIAGNOSIS — N926 Irregular menstruation, unspecified: Secondary | ICD-10-CM

## 2019-02-11 DIAGNOSIS — Z30016 Encounter for initial prescription of transdermal patch hormonal contraceptive device: Secondary | ICD-10-CM

## 2019-02-11 DIAGNOSIS — E282 Polycystic ovarian syndrome: Secondary | ICD-10-CM

## 2019-02-11 MED ORDER — NORELGESTROMIN-ETH ESTRADIOL 150-35 MCG/24HR TD PTWK: 1.0000 | MEDICATED_PATCH | TRANSDERMAL | 12 refills | Status: AC

## 2019-02-11 NOTE — Progress Notes (Signed)
Virtual Visit via Telephone Note  I connected with Leslie Allison on 02/11/19 at 10:50 AM EDT by telephone and verified that I am speaking with the correct person using two identifiers.   I discussed the limitations, risks, security and privacy concerns of performing an evaluation and management service by telephone and the availability of in person appointments. I also discussed with the patient that there may be a patient responsible charge related to this service. The patient expressed understanding and agreed to proceed.  The patient was in her car I spoke with the patient from my  office The names of people involved in this encounter were: Leslie Allison Dr. Jerene Pitch.   History of Present Illness: Leslie Allison has not had a period since February. She sometimes forget to take her birth control pill, but she reports that her husband helps her take the medication. She says that he keeps the pill pack. She is  Not sure what day number of pills she is on. She has started taking her metformin and synthroid as well. She was referred to care management, but has not returned their calls or spoken to them. She would like to have a return to regular monthly periods and is eager for this to happen. Discussed that the best way to do this was to be compliant in taking her medication daily.    Observations/Objective:   Physical Exam could not be performed. Because of the COVID-19 outbreak this visit was performed over the phone and not in person.   Assessment and Plan: Not taking OCP regularly. Has not had a period since February. She reports that her husband gives her the pills. She doesn't know where the pill pack is and she doesn't know how many days or pills she has taken. Asked her to send me a photo of the pack through mychart if she is able to. Since patient is not compliant with pill pack will switch birth control to patch which only needs to be changed once a week. Discussed with patient who is agreeable  with this plan. Sent phone number for the chronic care  Follow Up Instructions: Will follow up in 1 month   I discussed the assessment and treatment plan with the patient. The patient was provided an opportunity to ask questions and all were answered. The patient agreed with the plan and demonstrated an understanding of the instructions.   The patient was advised to call back or seek an in-person evaluation if the symptoms worsen or if the condition fails to improve as anticipated.  I provided 15 minutes of non-face-to-face time during this encounter.  Adelene Idler MD [facility], [facility] 02/11/2019 11:12 AM

## 2019-02-15 ENCOUNTER — Ambulatory Visit: Payer: Self-pay

## 2019-02-15 ENCOUNTER — Telehealth: Payer: Self-pay

## 2019-02-15 DIAGNOSIS — E1165 Type 2 diabetes mellitus with hyperglycemia: Secondary | ICD-10-CM

## 2019-02-15 DIAGNOSIS — E039 Hypothyroidism, unspecified: Secondary | ICD-10-CM

## 2019-02-15 DIAGNOSIS — E282 Polycystic ovarian syndrome: Secondary | ICD-10-CM

## 2019-02-15 NOTE — Chronic Care Management (AMB) (Signed)
  Care Management   Note  02/15/2019 Name: Leslie Allison MRN: [id number] DOB: 1996-08-14  Care Coordination: Leslie Son Cardenis a 62year old femalewho seesEmily Annye Allison, FNPfor primary care. [name] the CCM team to consult the patient forchronic care management seconday to her DM, Thyroid Disease, and medication affordibility/managementReferral was placed3/31/2020. Patient's last office visit was3/30/2020. Patient consented to services however was not available during her scheduled telephone initial assessment. VM message requesting call back left.  Today, CCM RN CM contacted patients health plan, [employer] to see if patient was eligible for their ongoing Care Management Services. Spoke with Leslie Shiley, RN who will review patients history and notify this CCM RN CM when decision is made. CCM RN CM will collaborate with Leslie Allison to determine which Care Management Team is more appropriate to manage patient long term and notify patient at that time.   Follow Up Plan: Will await call back from Leslie Allison, [employer] E. Suzie Portela, RN, BSN Nurse Care Coordinator [facility] / [facility] Care Management  [phone]

## 2019-02-15 NOTE — Chronic Care Management (AMB) (Signed)
  Care Management   Note  02/15/2019 Name: Leslie Allison MRN: [id number] DOB: Feb 06, 1996  Care Coordination: Incoming call from [place], RN CM with [employer]. Leslie Allison confirms patient has been referred to Case Management in March 2020, however team has been unsuccessful in engaging patient. Multiple outreach attempts have been made and messages left. They will continue to outreach per their protocol.  CCM RN CM was able to reach patient to reschedule initial assessment and to discuss [employer] CM program. Unfortunately patient states she did not have access to a pen to write down [employer] CM number. Will discuss during initial assessment.   Follow Up Plan: Telephone assessment scheduled for 5/8 2020 at 3:00    Tikesha Mort E. Suzie Portela, RN, BSN Nurse Care Coordinator [facility] / [facility] Care Management  [phone]

## 2019-02-19 ENCOUNTER — Telehealth: Payer: Self-pay

## 2019-02-19 ENCOUNTER — Ambulatory Visit: Payer: Self-pay

## 2019-02-19 DIAGNOSIS — E282 Polycystic ovarian syndrome: Secondary | ICD-10-CM

## 2019-02-19 DIAGNOSIS — F79 Unspecified intellectual disabilities: Secondary | ICD-10-CM

## 2019-02-19 DIAGNOSIS — E1165 Type 2 diabetes mellitus with hyperglycemia: Secondary | ICD-10-CM

## 2019-02-19 NOTE — Chronic Care Management (AMB) (Signed)
     Care Management   Unsuccessful Call Note 02/09/2019 Name: Leslie Allison MRN: [id number]       DOB: 09-Dec-1995  Leslie Allison a 22year old femalewho seesEmily Annye Allison, FNPfor primary care. Leslie Allison the CCM team to consult the patient forchronic care management seconday to her DM, Thyroid Disease, and medication affordibility/managementReferral was placed3/31/2020. Patient's last office visit was3/30/2020. Today, patient was scheduled for an initial telephone assessment and goal setting at 3:00. This is the second no show appointment with Leslie Allison.  Was unable to reach patient via telephone today. Ihave left HIPAA compliant voicemail asking patient to return my call. (unsuccessful outreach #2).  Of note, it is documented in EMR that patient completed her 3rd appointment with RD 02/08/2019 of hypothyroid, DM, and obesity management.  Plan: Will follow-up within 7business days via telephone.    Leslie Allison E. Leslie Portela, RN, BSN Nurse Care Coordinator [facility] / [facility] Care Management  [phone]

## 2019-02-22 ENCOUNTER — Encounter: Payer: Self-pay | Admitting: Family Medicine

## 2019-02-22 ENCOUNTER — Ambulatory Visit (INDEPENDENT_AMBULATORY_CARE_PROVIDER_SITE_OTHER): Payer: PRIVATE HEALTH INSURANCE | Admitting: Family Medicine

## 2019-02-22 ENCOUNTER — Other Ambulatory Visit: Payer: Self-pay

## 2019-02-22 DIAGNOSIS — E1169 Type 2 diabetes mellitus with other specified complication: Secondary | ICD-10-CM

## 2019-02-22 DIAGNOSIS — E039 Hypothyroidism, unspecified: Secondary | ICD-10-CM | POA: Diagnosis not present

## 2019-02-22 DIAGNOSIS — E669 Obesity, unspecified: Secondary | ICD-10-CM

## 2019-02-22 DIAGNOSIS — F79 Unspecified intellectual disabilities: Secondary | ICD-10-CM

## 2019-02-22 DIAGNOSIS — Z6841 Body Mass Index (BMI) 40.0 and over, adult: Secondary | ICD-10-CM

## 2019-02-22 DIAGNOSIS — I10 Essential (primary) hypertension: Secondary | ICD-10-CM

## 2019-02-22 MED ORDER — METFORMIN HCL ER 750 MG PO TB24: 750.0000 mg | ORAL_TABLET | Freq: Every day | ORAL | 2 refills | Status: DC

## 2019-02-22 NOTE — Progress Notes (Signed)
Name: Leslie Allison   MRN: [id number]    DOB: Mar 17, 1996   Date:02/22/2019       Progress Note  Subjective  Chief Complaint  Chief Complaint  Patient presents with  . Follow-up    I connected with  Leslie Allison  on 02/22/19 at  9:20 AM EDT by a video enabled telemedicine application and verified that I am speaking with the correct person using two identifiers.  I discussed the limitations of evaluation and management by telemedicine and the availability of in person appointments. The patient expressed understanding and agreed to proceed. Staff also discussed with the patient that there may be a patient responsible charge related to this service. Patient Location: Home Provider Location: Home Additional Individuals present: Mother-in Law Leslie Allison  HPI  Diabetes mellitus type 2 Checking sugars?  no Does patient feel additional teaching/training would be helpful?  She has CCM team involved for education Trying to limit white bread, white rice, white potatoes, sweets?  Trying to do more fruits and vegetables; she is trying to decrease junk food Trying to limit sweetened drinks like iced tea, soft drinks, sports drinks, fruit juices?  has a sweet beverage about twice a week (orange juice and sweet tea) Checking feet every day/night?  no Last eye exam: She had eye exam in early 2020 at [facility] Denies: polydipsia, polyphagia, vision changes, or neuropathy.  Does endorse some polyuria. Most recent A1C:  Lab Results  Component Value Date   HGBA1C 7.4 (H) 11/16/2018    We will recheck today. Last CMP Results : is due for repeat today    Component Value Date/Time   NA 136 11/09/2018 1939   K 3.5 11/09/2018 1939   CL 102 11/09/2018 1939   CO2 27 11/09/2018 1939   GLUCOSE 226 (H) 11/09/2018 1939   BUN 12 11/09/2018 1939   CREATININE 0.85 11/09/2018 1939   CALCIUM 8.6 (L) 11/09/2018 1939   PROT 7.0 04/18/2018 1906   ALBUMIN 3.9 04/18/2018 1906   AST 51 (H) 04/18/2018 1906    ALT 72 (H) 04/18/2018 1906   ALKPHOS 64 04/18/2018 1906   BILITOT 0.9 04/18/2018 1906   GFRNONAA >60 11/09/2018 1939   GFRAA >60 11/09/2018 1939  Urine Micro UTD? No Current Medication Management: Diabetic Medications:  ACEI/ARB: No Statin: No Aspirin therapy: Not Indicated  Hypothyroidism: History: hypothyroidism Current Medication Regimen: synthroid Current Symptoms: denies fatigue, weight changes, heat/cold intolerance, bowel/skin changes or CVS symptoms. Most recent results are below; we is repeating labs today. Lab Results  Component Value Date   TSH 6.960 (H) 11/30/2018   T4TOTAL 8.1 11/30/2018   HTN: She has been taking labetalol that was rx'd by GYN, did discussed possibly changing her to ACEI or ARB if warranted based on urine micro. -does take medications as prescribed - current regimen includes Labetalol  BID.  - taking medications as instructed, no medication side effects noted, no TIAs, no chest pain on exertion, no dyspnea on exertion, no swelling of ankles - DASH diet discussed - pt does not follow a low sodium diet; DASH diet discussed in detail.  Contraceptive Management: Seeing Dr. Jerene Allison - was not compliant with OCP, was switched to patch.  Doing well on her current medication.    Patient Active Problem List   Diagnosis Date Noted  . Irregular uterine bleeding 12/21/2018  . Abnormal thyroid blood test 12/21/2018  . PCO (polycystic ovaries) 12/10/2016  . Vaginal discharge 11/21/2016  . Amenorrhea 07/18/2016  . Morbid  obesity with BMI of 45.0-49.9, adult (HCC) 07/18/2016  . Mentally challenged 07/18/2016  . Family history of breast cancer in mother 07/18/2016    Past Surgical History:  Procedure Laterality Date  . NO PAST SURGERIES      Family History  Problem Relation Age of Onset  . Breast cancer Mother   . Ovarian cancer Neg Hx   . Colon cancer Neg Hx   . Diabetes Neg Hx   . Heart disease Neg Hx     Social History    Socioeconomic History  . Marital status: Married    Spouse name: Leslie Allison  . Number of children: 0  . Years of education: Not on file  . Highest education level: High school graduate  Occupational History  . Occupation: unemployed    Comment: currently applying for disability  Social Needs  . Financial resource strain: Not hard at all  . Food insecurity:    Worry: Never true    Inability: Never true  . Transportation needs:    Medical: No    Non-medical: No  Tobacco Use  . Smoking status: Never Smoker  . Smokeless tobacco: Never Used  Substance and Sexual Activity  . Alcohol use: Yes    Comment: occasional wine  . Drug use: No  . Sexual activity: Yes    Partners: Male    Birth control/protection: Pill  Lifestyle  . Physical activity:    Days per week: 5 days    Minutes per session: 10 min  . Stress: Not at all  Relationships  . Social connections:    Talks on phone: More than three times a week    Gets together: Never    Attends religious service: Never    Active member of club or organization: No    Attends meetings of clubs or organizations: Never    Relationship status: Married  . Intimate partner violence:    Fear of current or ex partner: No    Emotionally abused: No    Physically abused: No    Forced sexual activity: No  Other Topics Concern  . Not on file  Social History Narrative  . Not on file     Current Outpatient Medications:  .  albuterol (PROVENTIL HFA;VENTOLIN HFA) 108 (90 Base) MCG/ACT inhaler, Inhale 2 puffs into the lungs every 6 (six) hours as needed for wheezing or shortness of breath., Disp: 1 Inhaler, Rfl: 2 .  ibuprofen (ADVIL,MOTRIN) 800 MG tablet, Take 1 tablet (800 mg total) by mouth every 8 (eight) hours as needed for moderate pain or cramping (with food)., Disp: 15 tablet, Rfl: 0 .  labetalol (NORMODYNE) 200 MG tablet, Take 1 tablet (200 mg total) by mouth 2 (two) times daily., Disp: 60 tablet, Rfl: 3 .  levothyroxine  (SYNTHROID) 25 MCG tablet, Take 1 tablet (25 mcg total) by mouth daily before breakfast., Disp: 30 tablet, Rfl: 11 .  metFORMIN (GLUCOPHAGE XR) 750 MG 24 hr tablet, Take 1 tablet (750 mg total) by mouth 2 (two) times daily. Take medication once a day for 1 week.  After one week increase to twice a day., Disp: 60 tablet, Rfl: 11 .  norelgestromin-ethinyl estradiol (XULANE) 150-35 MCG/24HR transdermal patch, Place 1 patch onto the skin once a week., Disp: 3 patch, Rfl: 12 .  Norethindrone-Ethinyl Estradiol-Fe Biphas (LO LOESTRIN FE) 1 MG-10 MCG / 10 MCG tablet, Take 1 tablet by mouth daily. (Patient not taking: Reported on 02/22/2019), Disp: 1 Package, Rfl: 11  Allergies  Allergen  Reactions  . Amoxicillin     Has patient had a PCN reaction causing immediate rash, facial/tongue/throat swelling, SOB or lightheadedness with hypotension: No Has patient had a PCN reaction causing severe rash involving mucus membranes or skin necrosis: No Has patient had a PCN reaction that required hospitalization: No Has patient had a PCN reaction occurring within the last 10 years: No If all of the above answers are "NO", then may proceed with Cephalosporin use.      I personally reviewed active problem list, medication list, allergies, family history, notes from last encounter, lab results with the patient/caregiver today.   ROS Constitutional: Negative for fever or weight change.  Respiratory: Negative for cough and shortness of breath.   Cardiovascular: Negative for chest pain or palpitations.  Gastrointestinal: Negative for abdominal pain, no bowel changes.  Musculoskeletal: Negative for gait problem or joint swelling.  Skin: Negative for rash.  Neurological: Negative for dizziness or headache.  No other specific complaints in a complete review of systems (except as listed in HPI above).  Objective  Virtual encounter, vitals not obtained.  There is no height or weight on file to calculate BMI.   Physical Exam Constitutional: Patient appears well-developed and well-nourished. No distress.  HENT: Head: Normocephalic and atraumatic.  Neck: Normal range of motion. Pulmonary/Chest: Effort normal. No respiratory distress. Speaking in complete sentences Neurological: Pt is alert and oriented to person, place, and time. Coordination, speech are normal.  Psychiatric: Patient has a normal mood and affect. behavior is normal. Judgment and thought content normal.  No results found for this or any previous visit (from the past 72 hour(s)).  PHQ2/9: Depression screen [facility] 2/9 02/22/2019 12/21/2018 12/07/2018  Decreased Interest 0 0 0  Down, Depressed, Hopeless 0 0 0  PHQ - 2 Score 0 0 0  Altered sleeping 0 0 -  Tired, decreased energy 0 0 -  Change in appetite 0 0 -  Feeling bad or failure about yourself  0 0 -  Trouble concentrating 0 0 -  Moving slowly or fidgety/restless 0 0 -  Suicidal thoughts 0 0 -  PHQ-9 Score 0 0 -  Difficult doing work/chores Not difficult at all Not difficult at all -   PHQ-2/9 Result is negative.    Fall Risk: Fall Risk  02/22/2019 02/08/2019 01/04/2019 12/21/2018 12/07/2018  Falls in the past year? 0 0 0 0 0  Number falls in past yr: 0 - - 0 -  Injury with Fall? 0 - - 0 -    Assessment & Plan  1. Diabetes mellitus type 2 in obese (HCC) - Significant education is provided to the patient and her mother in law. They will reach out to CCM together on Thursday to gain further understanding of her disease process. - Hemoglobin A1c - Lipid panel - metFORMIN (GLUCOPHAGE XR) 750 MG 24 hr tablet; Take 1 tablet (750 mg total) by mouth daily with breakfast.  Dispense: 180 tablet; Refill: 2 - Urine Microalbumin w/creat. ratio - COMPLETE METABOLIC PANEL WITH GFR  2. Essential hypertension - Stable at last check in office; asymptomatic; consider switching to ACEI or ARB for simultaneou - COMPLETE METABOLIC PANEL WITH GFR  3. Acquired hypothyroidism Needs labs to  determine any needed dosing adjustment; she does have some fatigue but is otherwise asymptomatic. - TSH  4. Morbid obesity with BMI of 45.0-49.9, adult (HCC) - Discussed importance of 150 minutes of physical activity weekly, eat two servings of fish weekly, eat one serving of tree nuts (  cashews, pistachios, pecans, almonds.Marland Kitchen) every other day, eat 6 servings of fruit/vegetables daily and drink plenty of water and avoid sweet beverages.  - Hemoglobin A1c - Lipid panel - TSH  5. Mentally challenged - Mother in law assisted with visit today which seemed to aid in the patient's understanding.  Will continue recommending CCM team for education.  I discussed the assessment and treatment plan with the patient. The patient was provided an opportunity to ask questions and all were answered. The patient agreed with the plan and demonstrated an understanding of the instructions.  The patient was advised to call back or seek an in-person evaluation if the symptoms worsen or if the condition fails to improve as anticipated.  I provided 31 minutes of non-face-to-face time during this encounter.

## 2019-02-26 ENCOUNTER — Other Ambulatory Visit: Payer: Self-pay

## 2019-02-26 ENCOUNTER — Ambulatory Visit: Payer: Self-pay

## 2019-02-26 ENCOUNTER — Encounter: Payer: Self-pay | Admitting: Family Medicine

## 2019-02-26 DIAGNOSIS — E669 Obesity, unspecified: Secondary | ICD-10-CM

## 2019-02-26 DIAGNOSIS — E282 Polycystic ovarian syndrome: Secondary | ICD-10-CM

## 2019-02-26 DIAGNOSIS — E1169 Type 2 diabetes mellitus with other specified complication: Secondary | ICD-10-CM

## 2019-02-26 DIAGNOSIS — I10 Essential (primary) hypertension: Secondary | ICD-10-CM

## 2019-02-26 LAB — COMPLETE METABOLIC PANEL WITH GFR
AG Ratio: 1.4 (calc) (ref 1.0–2.5)
ALT: 57 U/L — ABNORMAL HIGH (ref 6–29)
AST: 40 U/L — ABNORMAL HIGH (ref 10–30)
Albumin: 4.1 g/dL (ref 3.6–5.1)
Alkaline phosphatase (APISO): 58 U/L (ref 31–125)
BUN: 8 mg/dL (ref 7–25)
CO2: 27 mmol/L (ref 20–32)
Calcium: 9.2 mg/dL (ref 8.6–10.2)
Chloride: 102 mmol/L (ref 98–110)
Creat: 0.71 mg/dL (ref 0.50–1.10)
GFR, Est African American: 141 mL/min/{1.73_m2} (ref >=60)
GFR, Est Non African American: 122 mL/min/{1.73_m2} (ref >=60)
Globulin: 2.9 g/dL (calc) (ref 1.9–3.7)
Glucose, Bld: 161 mg/dL — ABNORMAL HIGH (ref 65–99)
Potassium: 4.1 mmol/L (ref 3.5–5.3)
Sodium: 135 mmol/L (ref 135–146)
Total Bilirubin: 0.5 mg/dL (ref 0.2–1.2)
Total Protein: 7 g/dL (ref 6.1–8.1)

## 2019-02-26 LAB — LIPID PANEL
Cholesterol: 210 mg/dL — ABNORMAL HIGH (ref <200)
HDL: 36 mg/dL — ABNORMAL LOW (ref >=50)
LDL Cholesterol (Calc): 122 mg/dL (calc) — ABNORMAL HIGH
Non-HDL Cholesterol (Calc): 174 mg/dL (calc) — ABNORMAL HIGH (ref <130)
Total CHOL/HDL Ratio: 5.8 (calc) — ABNORMAL HIGH (ref <5.0)
Triglycerides: 367 mg/dL — ABNORMAL HIGH (ref <150)

## 2019-02-26 LAB — HEMOGLOBIN A1C
Hgb A1c MFr Bld: 7.2 % of total Hgb — ABNORMAL HIGH (ref <5.7)
Mean Plasma Glucose: 160 (calc)
eAG (mmol/L): 8.9 (calc)

## 2019-02-26 LAB — TSH: TSH: 8.1 mIU/L — ABNORMAL HIGH

## 2019-02-26 NOTE — Patient Instructions (Addendum)
Thank you allowing the Chronic Care Management Team to be a part of your care! It was a pleasure speaking with you today!  1. Please call [facility] and ask for the Case Management Department. They have been trying to reach you. 2. Please continue to decrease your carbohydrate and sodium intake. This will help with your blood sugars and you high blood pressure. 3. Make sure you are drinking plenty of water. Your urine should be pale yellow. 4. Take ALL medications as prescribed. 5. Read the information below with your family and call with any questions or concerns. I will be happy to assist you with any needs you may have. You just have to call. 6. Make sure you are getting plenty of exercise. 30 min a day. You can split this time up by 15 and 15, or 10, 10, and 10. Any exercise is better than none.  CCM (Chronic Care Management) Team   Trish Fountain RN, BSN Nurse Care Coordinator  [phone]  Ruben Reason PharmD  Clinical Pharmacist  [phone]   Elliot Gurney, LCSW Clinical Social Worker [phone]  Print copy of patient instructions provided.   Telephone follow up appointment with CCM team member scheduled for: 1 week to make sure educational materials recieved  SYMPTOMS OF A STROKE   You have any symptoms of stroke. "BE FAST" is an easy way to remember the main warning signs: ? B - Balance. Signs are dizziness, sudden trouble walking, or loss of balance. ? E - Eyes. Signs are trouble seeing or a sudden change in how you see. ? F - Face. Signs are sudden weakness or loss of feeling of the face, or the face or eyelid drooping on one side. ? A - Arms. Signs are weakness or loss of feeling in an arm. This happens suddenly and usually on one side of the body. ? S - Speech. Signs are sudden trouble speaking, slurred speech, or trouble understanding what people say. ? T - Time. Time to call emergency services. Write down what time symptoms started.  You have other signs  of stroke, such as: ? A sudden, very bad headache with no known cause. ? Feeling sick to your stomach (nausea). ? Throwing up (vomiting). ? Jerky movements you cannot control (seizure).  SYMPTOMS OF A HEART ATTACK  What are the signs or symptoms? Symptoms of this condition include:  Chest pain. It may feel like: ? Crushing or squeezing. ? Tightness, pressure, fullness, or heaviness.  Pain in the arm, neck, jaw, back, or upper body.  Shortness of breath.  Heartburn.  Indigestion.  Nausea.  Cold sweats.  Feeling tired.  Sudden lightheadedness.   DASH Eating Plan DASH stands for "Dietary Approaches to Stop Hypertension." The DASH eating plan is a healthy eating plan that has been shown to reduce high blood pressure (hypertension). It may also reduce your risk for type 2 diabetes, heart disease, and stroke. The DASH eating plan may also help with weight loss. What are tips for following this plan?  General guidelines  Avoid eating more than 2,300 mg (milligrams) of salt (sodium) a day. If you have hypertension, you may need to reduce your sodium intake to 1,500 mg a day.  Limit alcohol intake to no more than 1 drink a day for nonpregnant women and 2 drinks a day for men. One drink equals 12 oz of beer, 5 oz of wine, or 1 oz of hard liquor.  Work with your health care provider to maintain  a healthy body weight or to lose weight. Ask what an ideal weight is for you.  Get at least 30 minutes of exercise that causes your heart to beat faster (aerobic exercise) most days of the week. Activities may include walking, swimming, or biking.  Work with your health care provider or diet and nutrition specialist (dietitian) to adjust your eating plan to your individual calorie needs. Reading food labels   Check food labels for the amount of sodium per serving. Choose foods with less than 5 percent of the Daily Value of sodium. Generally, foods with less than 300 mg of sodium per  serving fit into this eating plan.  To find whole grains, look for the word "whole" as the first word in the ingredient list. Shopping  Buy products labeled as "low-sodium" or "no salt added."  Buy fresh foods. Avoid canned foods and premade or frozen meals. Cooking  Avoid adding salt when cooking. Use salt-free seasonings or herbs instead of table salt or sea salt. Check with your health care provider or pharmacist before using salt substitutes.  Do not fry foods. Cook foods using healthy methods such as baking, boiling, grilling, and broiling instead.  Cook with heart-healthy oils, such as olive, canola, soybean, or sunflower oil. Meal planning  Eat a balanced diet that includes: ? 5 or more servings of fruits and vegetables each day. At each meal, try to fill half of your plate with fruits and vegetables. ? Up to 6-8 servings of whole grains each day. ? Less than 6 oz of lean meat, poultry, or fish each day. A 3-oz serving of meat is about the same size as a deck of cards. One egg equals 1 oz. ? 2 servings of low-fat dairy each day. ? A serving of nuts, seeds, or beans 5 times each week. ? Heart-healthy fats. Healthy fats called Omega-3 fatty acids are found in foods such as flaxseeds and coldwater fish, like sardines, salmon, and mackerel.  Limit how much you eat of the following: ? Canned or prepackaged foods. ? Food that is high in trans fat, such as fried foods. ? Food that is high in saturated fat, such as fatty meat. ? Sweets, desserts, sugary drinks, and other foods with added sugar. ? Full-fat dairy products.  Do not salt foods before eating.  Try to eat at least 2 vegetarian meals each week.  Eat more home-cooked food and less restaurant, buffet, and fast food.  When eating at a restaurant, ask that your food be prepared with less salt or no salt, if possible. What foods are recommended? The items listed may not be a complete list. Talk with your dietitian about  what dietary choices are best for you. Grains Whole-grain or whole-wheat bread. Whole-grain or whole-wheat pasta. Brown rice. [place] Morrow. Bulgur. Whole-grain and low-sodium cereals. Pita bread. Low-fat, low-sodium crackers. Whole-wheat flour tortillas. Vegetables Fresh or frozen vegetables (raw, steamed, roasted, or grilled). Low-sodium or reduced-sodium tomato and vegetable juice. Low-sodium or reduced-sodium tomato sauce and tomato paste. Low-sodium or reduced-sodium canned vegetables. Fruits All fresh, dried, or frozen fruit. Canned fruit in natural juice (without added sugar). Meat and other protein foods Skinless chicken or [place]. Ground chicken or [place]. Pork with fat trimmed off. Fish and seafood. Egg whites. Dried beans, peas, or lentils. Unsalted nuts, nut butters, and seeds. Unsalted canned beans. Lean cuts of beef with fat trimmed off. Low-sodium, lean deli meat. Dairy Low-fat (1%) or fat-free (skim) milk. Fat-free, low-fat, or reduced-fat cheeses. Nonfat, low-sodium  ricotta or cottage cheese. Low-fat or nonfat yogurt. Low-fat, low-sodium cheese. Fats and oils Soft margarine without trans fats. Vegetable oil. Low-fat, reduced-fat, or light mayonnaise and salad dressings (reduced-sodium). Canola, safflower, olive, soybean, and sunflower oils. Avocado. Seasoning and other foods Herbs. Spices. Seasoning mixes without salt. Unsalted popcorn and pretzels. Fat-free sweets. What foods are not recommended? The items listed may not be a complete list. Talk with your dietitian about what dietary choices are best for you. Grains Baked goods made with fat, such as croissants, muffins, or some breads. Dry pasta or rice meal packs. Vegetables Creamed or fried vegetables. Vegetables in a cheese sauce. Regular canned vegetables (not low-sodium or reduced-sodium). Regular canned tomato sauce and paste (not low-sodium or reduced-sodium). Regular tomato and vegetable juice (not low-sodium or  reduced-sodium). Leslie Allison. Olives. Fruits Canned fruit in a light or heavy syrup. Fried fruit. Fruit in cream or butter sauce. Meat and other protein foods Fatty cuts of meat. Ribs. Fried meat. Berniece Salines. Sausage. Bologna and other processed lunch meats. Salami. Fatback. Hotdogs. Bratwurst. Salted nuts and seeds. Canned beans with added salt. Canned or smoked fish. Whole eggs or egg yolks. Chicken or [place] with skin. Dairy Whole or 2% milk, cream, and half-and-half. Whole or full-fat cream cheese. Whole-fat or sweetened yogurt. Full-fat cheese. Nondairy creamers. Whipped toppings. Processed cheese and cheese spreads. Fats and oils Butter. Stick margarine. Lard. Shortening. Ghee. Bacon fat. Tropical oils, such as coconut, palm kernel, or palm oil. Seasoning and other foods Salted popcorn and pretzels. Onion salt, garlic salt, seasoned salt, table salt, and sea salt. Worcestershire sauce. Tartar sauce. Barbecue sauce. Teriyaki sauce. Soy sauce, including reduced-sodium. Steak sauce. Canned and packaged gravies. Fish sauce. Oyster sauce. Cocktail sauce. Horseradish that you find on the shelf. Ketchup. Mustard. Meat flavorings and tenderizers. Bouillon cubes. Hot sauce and Tabasco sauce. Premade or packaged marinades. Premade or packaged taco seasonings. Relishes. Regular salad dressings. Where to find more information:  National Heart, Lung, and [place]: https://wilson-eaton.com/  American Heart Association: www.heart.org Summary  The DASH eating plan is a healthy eating plan that has been shown to reduce high blood pressure (hypertension). It may also reduce your risk for type 2 diabetes, heart disease, and stroke.  With the DASH eating plan, you should limit salt (sodium) intake to 2,300 mg a day. If you have hypertension, you may need to reduce your sodium intake to 1,500 mg a day.  When on the DASH eating plan, aim to eat more fresh fruits and vegetables, whole grains, lean proteins, low-fat  dairy, and heart-healthy fats.  Work with your health care provider or diet and nutrition specialist (dietitian) to adjust your eating plan to your individual calorie needs.   Managing Your Hypertension Hypertension is commonly called high blood pressure. This is when the force of your blood pressing against the walls of your arteries is too strong. Arteries are blood vessels that carry blood from your heart throughout your body. Hypertension forces the heart to work harder to pump blood, and may cause the arteries to become narrow or stiff. Having untreated or uncontrolled hypertension can cause heart attack, stroke, kidney disease, and other problems. What are blood pressure readings? A blood pressure reading consists of a higher number over a lower number. Ideally, your blood pressure should be below 120/80. The first ("top") number is called the systolic pressure. It is a measure of the pressure in your arteries as your heart beats. The second ("bottom") number is called the diastolic pressure. It is  a measure of the pressure in your arteries as the heart relaxes. What does my blood pressure reading mean? Blood pressure is classified into four stages. Based on your blood pressure reading, your health care provider may use the following stages to determine what type of treatment you need, if any. Systolic pressure and diastolic pressure are measured in a unit called mm Hg. Normal  Systolic pressure: below 357.  Diastolic pressure: below 80. Elevated  Systolic pressure: 017-793.  Diastolic pressure: below 80. Hypertension stage 1  Systolic pressure: 903-009.  Diastolic pressure: 23-30. Hypertension stage 2  Systolic pressure: 076 or above.  Diastolic pressure: 90 or above. What health risks are associated with hypertension? Managing your hypertension is an important responsibility. Uncontrolled hypertension can lead to:  A heart attack.  A stroke.  A weakened blood vessel  (aneurysm).  Heart failure.  Kidney damage.  Eye damage.  Metabolic syndrome.  Memory and concentration problems. What changes can I make to manage my hypertension? Hypertension can be managed by making lifestyle changes and possibly by taking medicines. Your health care provider will help you make a plan to bring your blood pressure within a normal range. Eating and drinking   Eat a diet that is high in fiber and potassium, and low in salt (sodium), added sugar, and fat. An example eating plan is called the DASH (Dietary Approaches to Stop Hypertension) diet. To eat this way: ? Eat plenty of fresh fruits and vegetables. Try to fill half of your plate at each meal with fruits and vegetables. ? Eat whole grains, such as whole wheat pasta, brown rice, or whole grain bread. Fill about one quarter of your plate with whole grains. ? Eat low-fat diary products. ? Avoid fatty cuts of meat, processed or cured meats, and poultry with skin. Fill about one quarter of your plate with lean proteins such as fish, chicken without skin, beans, eggs, and tofu. ? Avoid premade and processed foods. These tend to be higher in sodium, added sugar, and fat.  Reduce your daily sodium intake. Most people with hypertension should eat less than 1,500 mg of sodium a day.  Limit alcohol intake to no more than 1 drink a day for nonpregnant women and 2 drinks a day for men. One drink equals 12 oz of beer, 5 oz of wine, or 1 oz of hard liquor. Lifestyle  Work with your health care provider to maintain a healthy body weight, or to lose weight. Ask what an ideal weight is for you.  Get at least 30 minutes of exercise that causes your heart to beat faster (aerobic exercise) most days of the week. Activities may include walking, swimming, or biking.  Include exercise to strengthen your muscles (resistance exercise), such as weight lifting, as part of your weekly exercise routine. Try to do these types of exercises for  30 minutes at least 3 days a week.  Do not use any products that contain nicotine or tobacco, such as cigarettes and e-cigarettes. If you need help quitting, ask your health care provider.  Control any long-term (chronic) conditions you have, such as high cholesterol or diabetes. Monitoring  Monitor your blood pressure at home as told by your health care provider. Your personal target blood pressure may vary depending on your medical conditions, your age, and other factors.  Have your blood pressure checked regularly, as often as told by your health care provider. Working with your health care provider  Review all the medicines you take with  your health care provider because there may be side effects or interactions.  Talk with your health care provider about your diet, exercise habits, and other lifestyle factors that may be contributing to hypertension.  Visit your health care provider regularly. Your health care provider can help you create and adjust your plan for managing hypertension. Will I need medicine to control my blood pressure? Your health care provider may prescribe medicine if lifestyle changes are not enough to get your blood pressure under control, and if:  Your systolic blood pressure is 130 or higher.  Your diastolic blood pressure is 80 or higher. Take medicines only as told by your health care provider. Follow the directions carefully. Blood pressure medicines must be taken as prescribed. The medicine does not work as well when you skip doses. Skipping doses also puts you at risk for problems. Contact a health care provider if:  You think you are having a reaction to medicines you have taken.  You have repeated (recurrent) headaches.  You feel dizzy.  You have swelling in your ankles.  You have trouble with your vision. Get help right away if:  You develop a severe headache or confusion.  You have unusual weakness or numbness, or you feel faint.  You have  severe pain in your chest or abdomen.  You vomit repeatedly.  You have trouble breathing. Summary  Hypertension is when the force of blood pumping through your arteries is too strong. If this condition is not controlled, it may put you at risk for serious complications.  Your personal target blood pressure may vary depending on your medical conditions, your age, and other factors. For most people, a normal blood pressure is less than 120/80.  Hypertension is managed by lifestyle changes, medicines, or both. Lifestyle changes include weight loss, eating a healthy, low-sodium diet, exercising more, and limiting alcohol.  Type 2 Diabetes Mellitus, Self Care, Adult When you have type 2 diabetes (type 2 diabetes mellitus), you must make sure your blood sugar (glucose) stays in a healthy range. You can do this with:  Nutrition.  Exercise.  Lifestyle changes.  Medicines or insulin, if needed.  Support from your doctors and others. How to stay aware of blood sugar   Check your blood sugar level every day, as often as told.  Have your A1c (hemoglobin A1c) level checked two or more times a year. Have it checked more often if your doctor tells you to. Your doctor will set personal treatment goals for you. Generally, you should have these blood sugar levels:  Before meals (preprandial): 80-130 mg/dL (4.4-7.2 mmol/L).  After meals (postprandial): below 180 mg/dL (10 mmol/L).  A1c level: less than 7%. How to manage high and low blood sugar Signs of high blood sugar High blood sugar is called hyperglycemia. Know the signs of high blood sugar. Signs may include:  Feeling: ? Thirsty. ? Hungry. ? Very tired.  Needing to pee (urinate) more than usual.  Blurry vision. Signs of low blood sugar Low blood sugar is called hypoglycemia. This is when blood sugar is at or below 70 mg/dL (3.9 mmol/L). Signs may include:  Feeling: ? Hungry. ? Worried or nervous (anxious). ? Sweaty and  clammy. ? Confused. ? Dizzy. ? Sleepy. ? Sick to your stomach (nauseous).  Having: ? A fast heartbeat. ? A headache. ? A change in your vision. ? Jerky movements that you cannot control (seizure). ? Tingling or no feeling (numbness) around your mouth, lips, or tongue.  Having trouble  with: ? Moving (coordination). ? Sleeping. ? Passing out (fainting). ? Getting upset easily (irritability). Treating low blood sugar To treat low blood sugar, eat or drink something sugary right away. If you can think clearly and swallow safely, follow the 15:15 rule:  Take 15 grams of a fast-acting carb (carbohydrate). Talk with your doctor about how much you should take.  Some fast-acting carbs are: ? Sugar tablets (glucose pills). Take 3-4 pills. ? 6-8 pieces of hard candy. ? 4-6 oz (120-150 mL) of fruit juice. ? 4-6 oz (120-150 mL) of regular (not diet) soda. ? 1 Tbsp (15 mL) honey or sugar.  Check your blood sugar 15 minutes after you take the carb.  If your blood sugar is still at or below 70 mg/dL (3.9 mmol/L), take 15 grams of a carb again.  If your blood sugar does not go above 70 mg/dL (3.9 mmol/L) after 3 tries, get help right away.  After your blood sugar goes back to normal, eat a meal or a snack within 1 hour. Treating very low blood sugar If your blood sugar is at or below 54 mg/dL (3 mmol/L), you have very low blood sugar (severe hypoglycemia). This is an emergency. Do not wait to see if the symptoms will go away. Get medical help right away. Call your local emergency services (911 in the [place] If you have very low blood sugar and you cannot eat or drink, you may need a glucagon shot (injection). A family member or friend should learn how to check your blood sugar and how to give you a glucagon shot. Ask your doctor if you need to have a glucagon shot kit at home. Follow these instructions at home: Medicine  Take insulin and diabetes medicines as told.  If your doctor says  you should take more or less insulin and medicines, do this exactly as told.  Do not run out of insulin or medicines. Having diabetes can raise your risk for other long-term conditions. These include heart disease and kidney disease. Your doctor may prescribe medicines to help you not have these problems. Food   Make healthy food choices. These include: ? Chicken, fish, egg whites, and beans. ? Oats, whole wheat, bulgur, brown rice, quinoa, and millet. ? Fresh fruits and vegetables. ? Low-fat dairy products. ? Nuts, avocado, olive oil, and canola oil.  Meet with a food specialist (dietitian). He or she can help you make an eating plan that is right for you.  Follow instructions from your doctor about what you cannot eat or drink.  Drink enough fluid to keep your pee (urine) pale yellow.  Keep track of carbs that you eat. Do this by reading food labels and learning food serving sizes.  Follow your sick day plan when you cannot eat or drink normally. Make this plan with your doctor so it is ready to use. Activity  Exercise 3 or more times a week.  Do not go more than 2 days without exercising.  Talk with your doctor before you start a new exercise. Your doctor may need to tell you to change: ? How much insulin or medicines you take. ? How much food you eat. Lifestyle  Do not use any tobacco products. These include cigarettes, chewing tobacco, and e-cigarettes. If you need help quitting, ask your doctor.  Ask your doctor how much alcohol is safe for you.  Learn to deal with stress. If you need help with this, ask your doctor. Body care   Stay up  to date with your shots (immunizations).  Have your eyes and feet checked by a doctor as often as told.  Check your skin and feet every day. Check for cuts, bruises, redness, blisters, or sores.  Brush your teeth and gums two times a day. Floss one or more times a day.  Go to the dentist one or more times every 6 months.  Stay  at a healthy weight. General instructions  Take over-the-counter and prescription medicines only as told by your doctor.  Share your diabetes care plan with: ? Your work or school. ? People you live with.  Carry a card or wear jewelry that says you have diabetes.  Keep all follow-up visits as told by your doctor. This is important. Questions to ask your doctor  Do I need to meet with a diabetes educator?  Where can I find a support group for people with diabetes? Where to find more information To learn more about diabetes, visit:  American Diabetes Association: www.diabetes.org  American Association of Diabetes Educators: www.diabeteseducator.org Summary  When you have type 2 diabetes, you must make sure your blood sugar (glucose) stays in a healthy range.  Check your blood sugar every day, as often as told.  Having diabetes can raise your risk for other conditions. Your doctor may prescribe medicines to help you not have these problems.  Keep all follow-up visits as told by your doctor. This is important. This information is not intended to replace advice given to you by your health care provider. Make sure you discuss any questions you have with your health care provider. Document Released: 01/22/2016 Document Revised: 03/23/2018 Document Reviewed: 11/03/2015 Elsevier Interactive Patient Education  2019 [employer].

## 2019-02-26 NOTE — Chronic Care Management (AMB) (Signed)
  Care Management   Note  02/26/2019 Name: Leslie Allison MRN: [id number] DOB: 08/23/1996  Care Coordination: Successful telephone encounter to Leslie Allison, 23 year old female patient of Maurice Small, FNP who was referred to the CCM program for education and support related to DM, Hypothyroidism, and PCO.   CCM RN CM has made several attempts to complete initial health assessment and establish health goals, as agreed upon by patient. Unfortunately patient is not available during scheduled telephone appointments. When questioned today about "no shows", patient states she is "either babysitting or eating".  Patient states she is doing just fine with her diet. She does not wish to engage in conversation about her health. She is informed by this RN CM that [employer] Case Management department has been attempting to reach her as well. She is encouraged to contact [facility] CM department by utilizing number on back of insurance card.  It is discussed that instead of rescheduling initial assessment, CCM RN CM will send additional diabetic education via mail. Patient has already completed nutritional counseling at [facility].  Plan: CCM RN CM will follow up with patient in 7 days to make sure educational materials received. Patient has been provided RN CM contact information incase additional needs arise.    Auburn Hester E. Suzie Portela, RN, BSN Nurse Care Coordinator [facility] Care Management  [phone]

## 2019-02-28 IMAGING — US US ART/VEN ABD/PELV/SCROTUM DOPPLER LTD
1 series · 13 of 25 positions shown · non-contrast
Comparison: None.

CLINICAL DATA: Severe pelvic pain

EXAM:
TRANSABDOMINAL AND TRANSVAGINAL ULTRASOUND OF PELVIS
DOPPLER ULTRASOUND OF OVARIES
TECHNIQUE: Both transabdominal and transvaginal ultrasound examinations of the
pelvis were performed. Transabdominal technique was performed for
global imaging of the pelvis including uterus, ovaries, adnexal
regions, and pelvic cul-de-sac.
It was necessary to proceed with endovaginal exam following the
transabdominal exam to visualize the uterus, endometrium and
ovaries. Color and duplex Doppler ultrasound was utilized to
evaluate blood flow to the ovaries.

[Series 1: us art/ven abd/pelv/scrotum doppler ltd · 0.25mm/px · 13 of 75 slices shown]
[im 1/75]
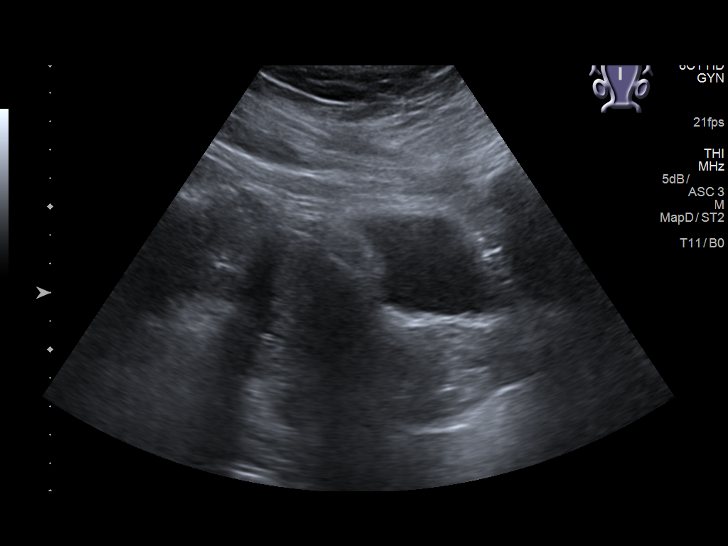
[im 7/75]
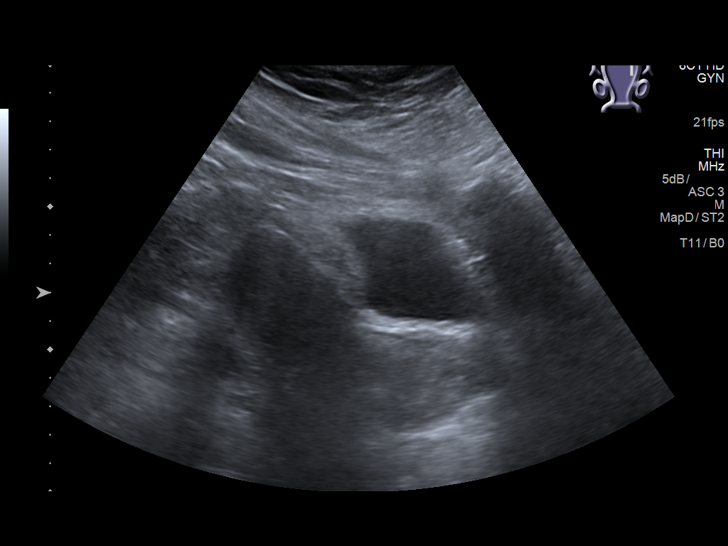
[im 13/75]
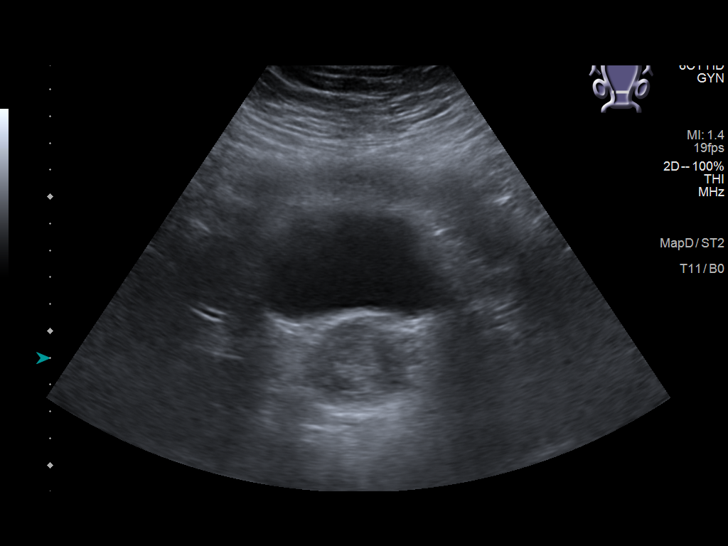
[im 19/75]
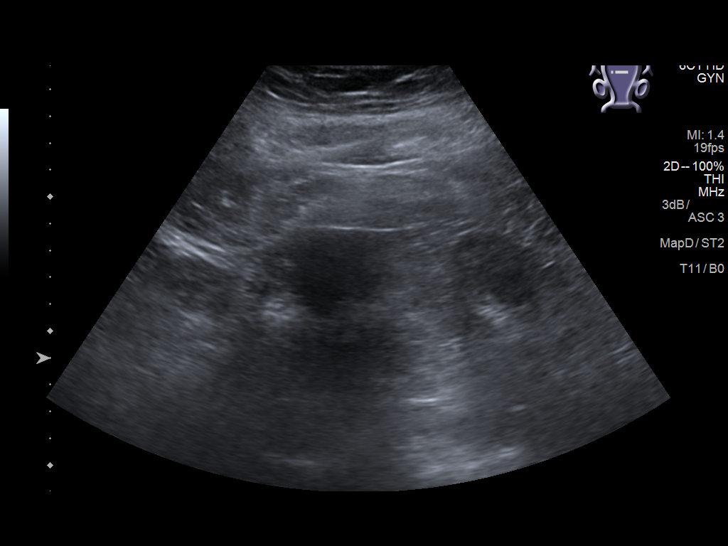
[im 25/75]
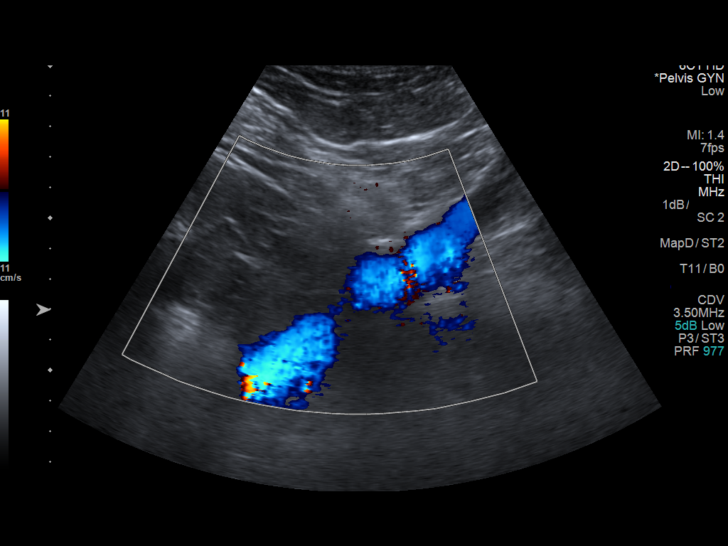
[im 31/75]
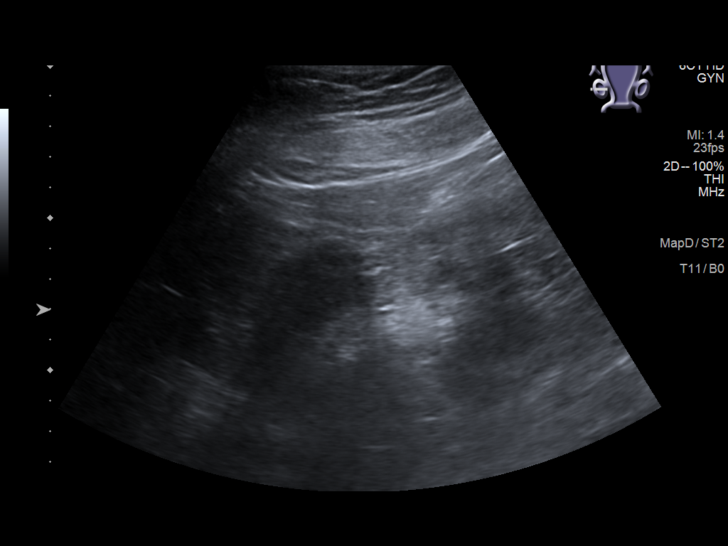
[im 38/75]
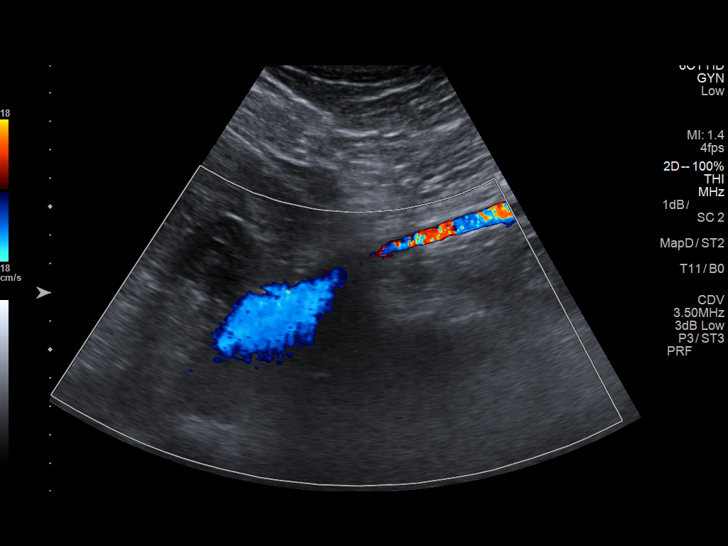
[im 44/75]
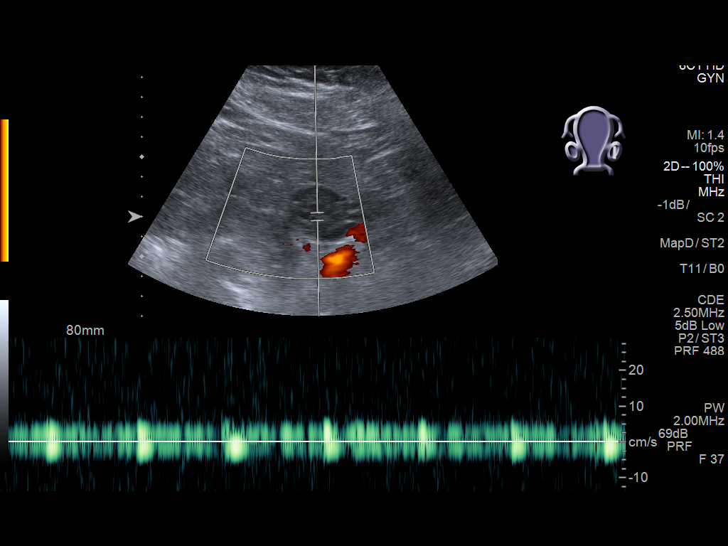
[im 50/75]
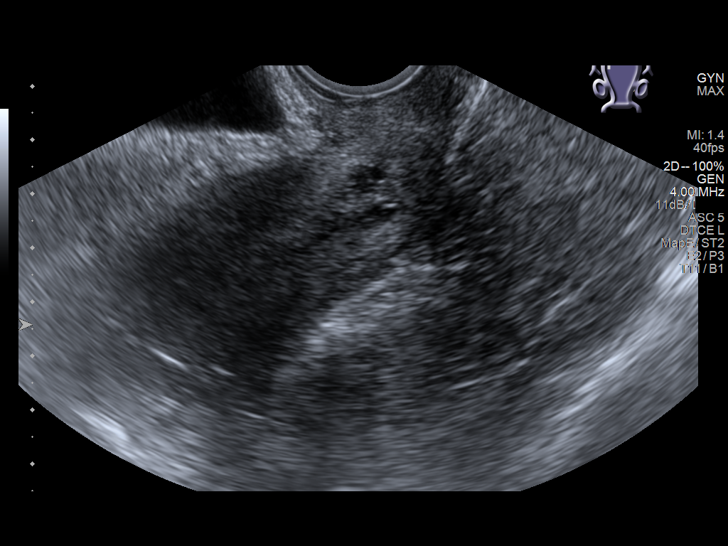
[im 56/75]
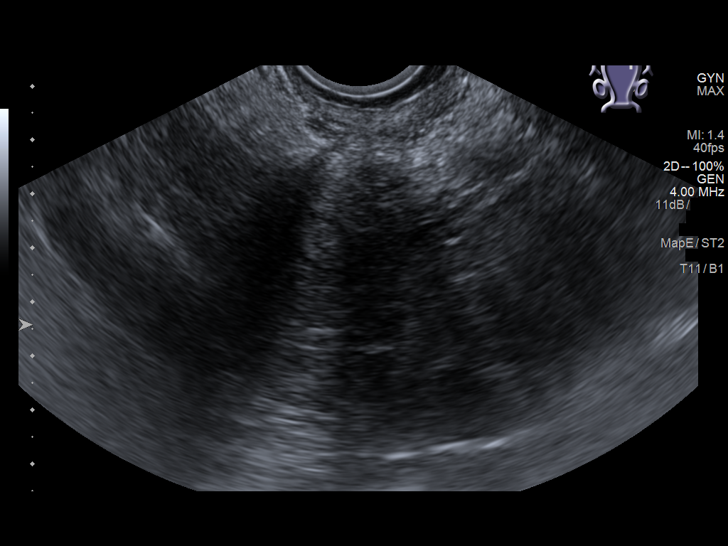
[im 62/75]
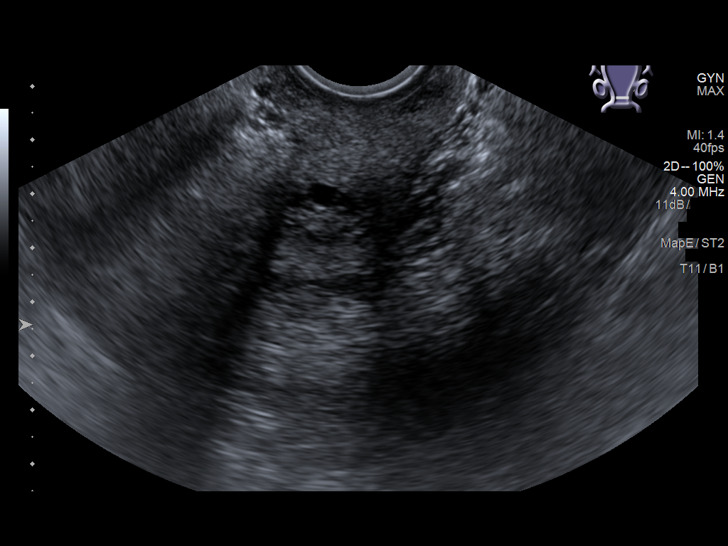
[im 68/75]
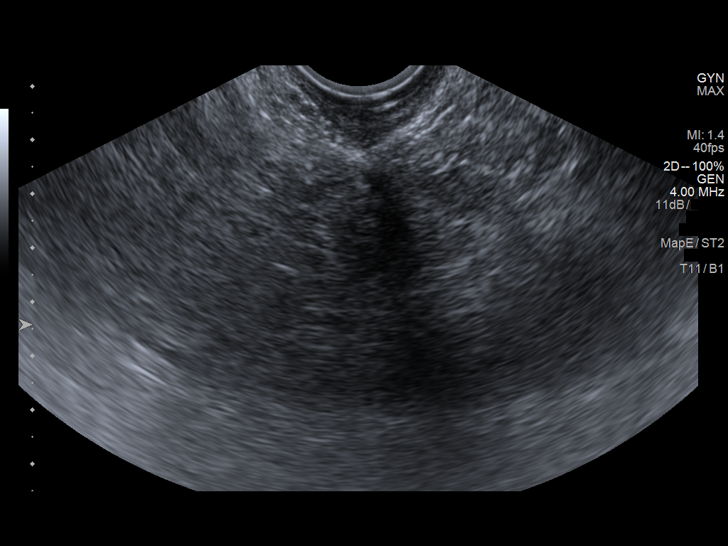
[im 75/75]
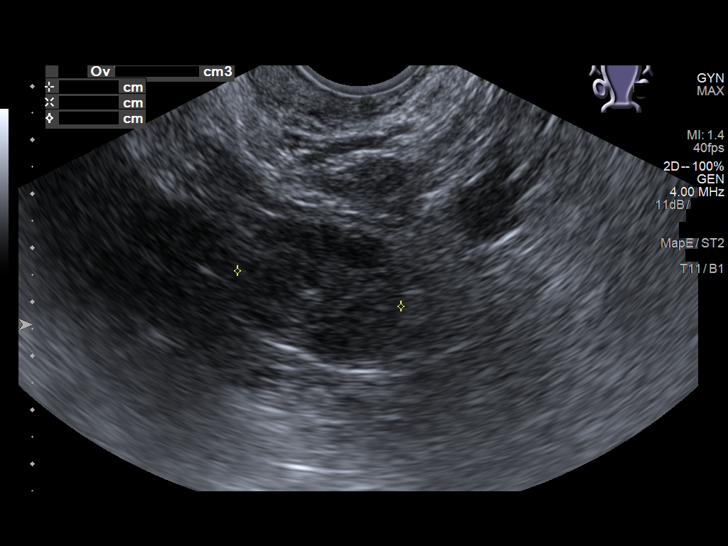

[13 of 25 positions shown; findings below may reference images not displayed]

FINDINGS: Uterus

Measurements: 7.2 x 3.3 x 2.5 cm (volume = 31 cm^3). No fibroids
or other mass visualized.

Endometrium

Thickness: 1 cm.  No focal abnormality visualized.

Right ovary

Measurements: 3.5 x 2.3 x 3.4 cm (volume = 14 cm^3). Normal
appearance/no adnexal mass.

Left ovary

Measurements: 3.4 x 3 x 3.1 cm (volume = 20 cm^3). Normal
appearance/no adnexal mass.

Pulsed Doppler evaluation of both ovaries demonstrates normal
low-resistance arterial and venous waveforms.

Other findings

No abnormal free fluid.
IMPRESSION: Unremarkable pelvic ultrasound. No adnexal mass. No ovarian torsion.

## 2019-03-05 ENCOUNTER — Ambulatory Visit: Payer: Self-pay

## 2019-03-05 ENCOUNTER — Other Ambulatory Visit: Payer: Self-pay

## 2019-03-05 NOTE — Chronic Care Management (AMB) (Signed)
  Care Management   Note  03/05/2019 Name: Leslie Allison MRN: [id number] DOB: 1996/05/20  Care Coordination: Successful telephone encounter to Leslie Allison to assess receipt of previously provided Transport planner. Leslie Allison states she has received materials and does not have any questions. She continues to not wish to engage in [employer]. She has been provided contact information for RN CM and encouraged to call if needs arise.  Follow Up Plan: No further follow up required    Amneet Cendejas E. Suzie Portela, RN, BSN Nurse Care Coordinator [facility] Care Management  [phone]

## 2019-03-11 ENCOUNTER — Ambulatory Visit (INDEPENDENT_AMBULATORY_CARE_PROVIDER_SITE_OTHER): Payer: PRIVATE HEALTH INSURANCE | Admitting: Obstetrics and Gynecology

## 2019-03-11 ENCOUNTER — Encounter: Payer: Self-pay | Admitting: Obstetrics and Gynecology

## 2019-03-11 ENCOUNTER — Other Ambulatory Visit: Payer: Self-pay

## 2019-03-11 NOTE — Progress Notes (Signed)
Delayed chart closure

## 2019-03-12 ENCOUNTER — Telehealth: Payer: Self-pay | Admitting: Family Medicine

## 2019-03-12 DIAGNOSIS — E038 Other specified hypothyroidism: Secondary | ICD-10-CM

## 2019-03-12 MED ORDER — LEVOTHYROXINE SODIUM 25 MCG PO TABS: 37.5000 ug | ORAL_TABLET | Freq: Every day | ORAL | 1 refills | Status: DC

## 2019-03-12 NOTE — Telephone Encounter (Signed)
Patient notified

## 2019-03-12 NOTE — Telephone Encounter (Signed)
Pt returned call today stating she has been compliant with synthroid.  Increase to 37.5mg  daily - take 1.5 tablets of her tablets once daily. Recheck in 6 weeks.

## 2019-03-15 ENCOUNTER — Encounter: Payer: Self-pay | Admitting: Dietician

## 2019-03-15 ENCOUNTER — Other Ambulatory Visit: Payer: Self-pay

## 2019-03-15 ENCOUNTER — Encounter: Payer: PRIVATE HEALTH INSURANCE | Attending: Obstetrics and Gynecology | Admitting: Dietician

## 2019-03-15 VITALS — Ht 62.0 in | Wt 344.5 lb

## 2019-03-15 DIAGNOSIS — Z6841 Body Mass Index (BMI) 40.0 and over, adult: Secondary | ICD-10-CM | POA: Diagnosis not present

## 2019-03-15 DIAGNOSIS — I10 Essential (primary) hypertension: Secondary | ICD-10-CM | POA: Diagnosis not present

## 2019-03-15 NOTE — Patient Instructions (Signed)
   Keep working to eat small food portions, especially fried foods, high fat meats like barbecue, hot dogs and sausages, steaks, and large amounts of cheese.   Make sure to eat some type of protein with most meals -- try adding some cooked chicken into a pack of ramen noodles, or add cut up [place] and cheese into a salad, or boiled eggs. Chicken salad and fruit can make an easy and light lunch.   Yogurt is also an easy way to get protein, just make sure it's low fat and low sugar -- look for "light" on the container.  Drink mostly water and sugar free and low-sugar drinks.   Stay active during the day by taking care of your niece or do other housework activity and/or take walks.

## 2019-03-15 NOTE — Progress Notes (Signed)
Medical Nutrition Therapy: Visit start time: 0930  end time: 1000  Assessment:  Diagnosis: obesity Medical history changes: no changes per patient Psychosocial issues/ stress concerns: high stress level  Current weight: 344.5lbs Height: 5'2" Medications, supplement changes: patient reports increase in Levothyroxine. Reconciled medication list in medical record.  Progress and evaluation:   A few more meatless meals in past two weeks due to rec. from PCP.  Stopped going to [facility] because if inability to get meatless meals.    She reports caring for her niece full time over the past few weeks, but no other physical activity or exercise.  Physical activity: none recently due to time-- taking care of niece and looking for school to begin classes in the fall.  Dietary Intake:  Usual eating pattern includes 2-3 meals and 2-3 snacks per day. Dining out frequency: 3-5 meals per week.  Breakfast: Special K cereal with Fairlife or other lactose-free milk Snack: cereal bar or rice krispie treat Lunch: sometimes skips due to helping with niece; Ramen noodles 2pkgs, but does not always eat all of it.  Snack: usually none; sometimes same as am or fruit apple, orange, watermelon Supper: chicken, veg, potatoes; 5/31-- Japanese steakhouse-- ribeye with shrimp + rice + veg; vegetarian meal at [employer], or takes meat out of chalupa or crunch wrap at [employer]: cereal bar or rice krispie treats Beverages: water, green tea, occasional sprite or rarely pepsi, occ 1/2-sweet tea at restaurants, shares small bottle Gatorade with niece  Nutrition Care Education: Topics covered: weight control Basic nutrition: appropriate nutrient balance, appropriate meal and snack schedule, general nutrition guidelines    Weight control: portion control; importance of low fat and losw sugar choices and specific options; importance of adequate protein while limiting high fat sources -- discussed options for including  protein with meals such as Ramen noodles (advised again to eat 1 pkg vs 2)  Nutritional Diagnosis:  [place]-3.3 Overweight/obesity As related to history of excess calories and inactivity, and hypothyroidism.  As evidenced by patient with current BMI of 63, working on diet and lifestyle changes for ongoing weight loss.  Intervention:   Discussion and instruction as noted above.  Patient is making progress with diet changes; she reports feeling less hungry recently (has increased thyroid medication)  Updated goals with input from patient.  Education Materials given:  Marland Kitchen Goals/ instructions  Learner/ who was taught:  . Patient    Level of understanding: Marland Kitchen Verbalizes/ demonstrates competency   Demonstrated degree of understanding via:   Teach back Learning barriers: . None  Willingness to learn/ readiness for change: . Eager, change in progress   Monitoring and Evaluation:  Dietary intake, exercise, and body weight      follow up: 04/26/19

## 2019-04-05 ENCOUNTER — Encounter: Payer: Self-pay | Admitting: Family Medicine

## 2019-04-05 DIAGNOSIS — E1169 Type 2 diabetes mellitus with other specified complication: Secondary | ICD-10-CM

## 2019-04-06 MED ORDER — METFORMIN HCL 500 MG PO TABS: 500.0000 mg | ORAL_TABLET | Freq: Two times a day (BID) | ORAL | 1 refills | Status: DC

## 2019-04-26 ENCOUNTER — Encounter: Payer: Self-pay | Admitting: Dietician

## 2019-04-26 ENCOUNTER — Encounter: Payer: PRIVATE HEALTH INSURANCE | Attending: Obstetrics and Gynecology | Admitting: Dietician

## 2019-04-26 ENCOUNTER — Other Ambulatory Visit: Payer: Self-pay

## 2019-04-26 VITALS — Ht 62.0 in | Wt 346.0 lb

## 2019-04-26 DIAGNOSIS — Z6841 Body Mass Index (BMI) 40.0 and over, adult: Secondary | ICD-10-CM | POA: Insufficient documentation

## 2019-04-26 DIAGNOSIS — I10 Essential (primary) hypertension: Secondary | ICD-10-CM | POA: Diagnosis not present

## 2019-04-26 NOTE — Patient Instructions (Signed)
   Make sure to eat some protein with each meal: try boiled eggs with Ramen noodles or salad. Or use string cheese or other lowfat cheese, small amount of nuts or peanut butter, pinto beans.   Drink mostly water, and limit drinks with sugar -- keep to 1 glass a day or less of regular drinks; diet and sugar free drinks are ok, no calories.   Stay active every day, exercise helps with stress.

## 2019-04-26 NOTE — Progress Notes (Signed)
Medical Nutrition Therapy: Visit start time: 0930  end time: 1015  Assessment:  Diagnosis: obesity Medical history changes: no changes Psychosocial issues/ stress concerns: none  Current weight: 346.0lbs Height: 5'2" Medications, supplement changes: no changes  Progress and evaluation:   Weight increase of 1.5lbs in past 6 weeks; patient reports currently menstruating.   Reports abdominal discomfort due to menstruation  Ongoing stress with father-in-law, caring for niece for 3 more weeks (will start school); niece has special needs, nonverbal   Physical activity: caring for small child  Dietary Intake:  Usual eating pattern includes 3 meals and 1-2 snacks per day. Dining out frequency: 1-2 meals per week.  Breakfast: Special K with Fairlife milk Snack: 1 rice krispie treat or cereal bar Lunch: does eat most days, often eats only 1/2 of food due to fulness -- usually Ramen noodles or salad Snack: fruit Supper: usually avoids meats now -- eats starch and veg Snack: usually none, rarely ice cream Beverages: water, green tea, Hi-C small cup, followed by diet drink or water. Chews ice to keep stomach from hurting  Nutrition Care Education: Topics covered: weight control Basic nutrition: appropriate nutrient balance-- importance of protein sources with meals,     Weight control:  Reviewed progress since previous visit and effects of stress on weight; limiting sugary beverages; importance of physical activity and options for light activity; tracking weight (patient states husband suggested tracking her weight regularly)  Nutritional Diagnosis:  [place]-3.3 Overweight/obesity As related to history of excess calories, limited activity, hypothyroidism, stress.  As evidenced by patient with current BMI of 63, working to limit food intake for weight loss.  Intervention:   Instruction and discussion as noted above.  Patient continues to make gradual changes, with slow weight loss.   Goals  for decreasing sugar and including protein have been updated.   Education Materials given:  Marland Kitchen Vegetarian Proteins . Goals/ instructions   Learner/ who was taught:  . Patient   Level of understanding: Marland Kitchen Verbalizes/ demonstrates competency   Demonstrated degree of understanding via:   Teach back Learning barriers: . None  Willingness to learn/ readiness for change: . Eager, change in progress   Monitoring and Evaluation:  Dietary intake, exercise, and body weight      follow up: 06/14/19

## 2019-04-27 ENCOUNTER — Other Ambulatory Visit: Payer: Self-pay | Admitting: Obstetrics and Gynecology

## 2019-04-27 DIAGNOSIS — N921 Excessive and frequent menstruation with irregular cycle: Secondary | ICD-10-CM

## 2019-04-27 MED ORDER — MEDROXYPROGESTERONE ACETATE 5 MG PO TABS: 5.0000 mg | ORAL_TABLET | Freq: Every day | ORAL | 0 refills | Status: DC

## 2019-05-25 ENCOUNTER — Encounter: Payer: Self-pay | Admitting: Family Medicine

## 2019-05-25 ENCOUNTER — Other Ambulatory Visit: Payer: Self-pay

## 2019-05-25 ENCOUNTER — Ambulatory Visit (INDEPENDENT_AMBULATORY_CARE_PROVIDER_SITE_OTHER): Payer: PRIVATE HEALTH INSURANCE | Admitting: Family Medicine

## 2019-05-25 DIAGNOSIS — R7401 Elevation of levels of liver transaminase levels: Secondary | ICD-10-CM

## 2019-05-25 DIAGNOSIS — Z6841 Body Mass Index (BMI) 40.0 and over, adult: Secondary | ICD-10-CM

## 2019-05-25 DIAGNOSIS — E039 Hypothyroidism, unspecified: Secondary | ICD-10-CM | POA: Diagnosis not present

## 2019-05-25 DIAGNOSIS — E1169 Type 2 diabetes mellitus with other specified complication: Secondary | ICD-10-CM

## 2019-05-25 DIAGNOSIS — E785 Hyperlipidemia, unspecified: Secondary | ICD-10-CM | POA: Insufficient documentation

## 2019-05-25 DIAGNOSIS — I10 Essential (primary) hypertension: Secondary | ICD-10-CM | POA: Diagnosis not present

## 2019-05-25 DIAGNOSIS — R740 Nonspecific elevation of levels of transaminase and lactic acid dehydrogenase [LDH]: Secondary | ICD-10-CM

## 2019-05-25 DIAGNOSIS — E669 Obesity, unspecified: Secondary | ICD-10-CM

## 2019-05-25 MED ORDER — BLOOD GLUCOSE MONITOR KIT: PACK | 0 refills | Status: AC

## 2019-05-25 NOTE — Progress Notes (Signed)
Name: Leslie Allison   MRN: [id number]    DOB: [birth year]   Date:05/25/2019       Progress Note  Subjective  Chief Complaint  Chief Complaint  Patient presents with  . Follow-up    I connected with  Lowell Bouton on 05/25/19 at  7:20 AM EDT by telephone and verified that I am speaking with the correct person using two identifiers.  I discussed the limitations, risks, security and privacy concerns of performing an evaluation and management service by telephone and the availability of in person appointments. Staff also discussed with the patient that there may be a patient responsible charge related to this service. Patient Location: Home Provider Location: Office Additional Individuals present: None  HPI  Diabetes mellitus type 2 & Obesity Checking sugars? no; would like a monitor to have on hand. Does patient feel additional teaching/training would be helpful?  She has CCM team involved for education Trying to limit white bread, white rice, white potatoes, sweets?  Trying to do more fruits and vegetables; she is trying to decrease junk food Trying to limit sweetened drinks like iced tea, soft drinks, sports drinks, fruit juices?  has a sweet beverage about twice a week (orange juice and sweet tea) Checking feet every day/night?  no Exercise: Not exercising currently Last eye exam: She had eye exam in early 2020 at [facility] Denies: polydipsia, polyphagia, vision changes, or neuropathy.  Does endorse some polyuria. Most recent A1C:  We will recheck today. Last CMP Results : is due for repeat today Urine Micro UTD? No Current Medication Management: Diabetic Medications: Metformin 530m BID ACEI/ARB: No - waiting on urine micro to determine need. Statin: No Aspirin therapy: Not Indicated  Hypothyroidism: History: hypothyroidism Current Medication Regimen: 37.514m synthroid Current Symptoms: denies fatigue, weight changes, heat/cold intolerance, bowel/skin changes or CVS  symptoms. Most recent results were still abnormal, so we increased to 37.30m43mdaily, however she never returned for her 6 week recheck - will have her come in to do this with other labs today.  HTN: She has been taking labetalol that was rx'd by GYN, did discussed possibly changing her to ACEI or ARB if warranted based on urine micro, but she never came in for urine micro testing after last visit.  -does take medications as prescribed - current regimen includes Labetalol 200m22mD.  - taking medications as instructed, no medication side effects noted, no TIAs, no chest pain on exertion, no dyspnea on exertion, no swelling of ankles - DASH diet discussed - pt does not follow a low sodium diet; DASH diet discussed in detail.  Contraceptive Management: Seeing Dr. SchuGilman Schmidtas not compliant with OCP, was switched to patch.  Doing well on her current medication and is compliant with this.    Patient Active Problem List   Diagnosis Date Noted  . Irregular uterine bleeding 12/21/2018  . Abnormal thyroid blood test 12/21/2018  . PCO (polycystic ovaries) 12/10/2016  . Vaginal discharge 11/21/2016  . Amenorrhea 07/18/2016  . Morbid obesity with BMI of 45.0-49.9, adult (HCC)[place]/02/2016  . Mentally challenged 07/18/2016  . Family history of breast cancer in mother 07/18/2016    Past Surgical History:  Procedure Laterality Date  . NO PAST SURGERIES      Family History  Problem Relation Age of Onset  . Breast cancer Mother   . Ovarian cancer Neg Hx   . Colon cancer Neg Hx   . Diabetes Neg Hx   . Heart disease Neg Hx  Social History   Socioeconomic History  . Marital status: Married    Spouse name: [place]ique Bradburn  . Number of children: 0  . Years of education: Not on file  . Highest education level: High school graduate  Occupational History  . Occupation: unemployed    Comment: currently applying for disability  Social Needs  . Financial resource strain: Not hard at all  .  Food insecurity    Worry: Never true    Inability: Never true  . Transportation needs    Medical: No    Non-medical: No  Tobacco Use  . Smoking status: Never Smoker  . Smokeless tobacco: Never Used  Substance and Sexual Activity  . Alcohol use: Yes    Comment: occasional wine  . Drug use: No  . Sexual activity: Yes    Partners: Male    Birth control/protection: Patch  Lifestyle  . Physical activity    Days per week: 5 days    Minutes per session: 10 min  . Stress: Not at all  Relationships  . Social Herbalist on phone: More than three times a week    Gets together: Never    Attends religious service: Never    Active member of club or organization: No    Attends meetings of clubs or organizations: Never    Relationship status: Married  . Intimate partner violence    Fear of current or ex partner: No    Emotionally abused: No    Physically abused: No    Forced sexual activity: No  Other Topics Concern  . Not on file  Social History Narrative  . Not on file     Current Outpatient Medications:  .  albuterol (PROVENTIL HFA;VENTOLIN HFA) 108 (90 Base) MCG/ACT inhaler, Inhale 2 puffs into the lungs every 6 (six) hours as needed for wheezing or shortness of breath., Disp: 1 Inhaler, Rfl: 2 .  ibuprofen (ADVIL,MOTRIN) 800 MG tablet, Take 1 tablet (800 mg total) by mouth every 8 (eight) hours as needed for moderate pain or cramping (with food)., Disp: 15 tablet, Rfl: 0 .  labetalol (NORMODYNE) 200 MG tablet, Take 1 tablet (200 mg total) by mouth 2 (two) times daily., Disp: 60 tablet, Rfl: 3 .  levothyroxine (SYNTHROID) 25 MCG tablet, Take 1.5 tablets (37.5 mcg total) by mouth daily before breakfast., Disp: 30 tablet, Rfl: 1 .  metFORMIN (GLUCOPHAGE) 500 MG tablet, Take 1 tablet (500 mg total) by mouth 2 (two) times daily with a meal., Disp: 180 tablet, Rfl: 1 .  norelgestromin-ethinyl estradiol (XULANE) 150-35 MCG/24HR transdermal patch, Place 1 patch onto the skin  once a week., Disp: 3 patch, Rfl: 12 .  medroxyPROGESTERone (PROVERA) 5 MG tablet, Take 1 tablet (5 mg total) by mouth daily for 20 days., Disp: 20 tablet, Rfl: 0  Allergies  Allergen Reactions  . Amoxicillin     Has patient had a PCN reaction causing immediate rash, facial/tongue/throat swelling, SOB or lightheadedness with hypotension: No Has patient had a PCN reaction causing severe rash involving mucus membranes or skin necrosis: No Has patient had a PCN reaction that required hospitalization: No Has patient had a PCN reaction occurring within the last 10 years: No If all of the above answers are "NO", then may proceed with Cephalosporin use.      I personally reviewed active problem list, medication list, allergies, notes from last encounter, lab results with the patient/caregiver today.   ROS  Constitutional: Negative for fever or weight  change.  Respiratory: Negative for cough and shortness of breath.   Cardiovascular: Negative for chest pain or palpitations.  Gastrointestinal: Negative for abdominal pain, no bowel changes.  Musculoskeletal: Negative for gait problem or joint swelling.  Skin: Negative for rash.  Neurological: Negative for dizziness or headache.  No other specific complaints in a complete review of systems (except as listed in HPI above).  Objective  Virtual encounter, vitals not obtained.  There is no height or weight on file to calculate BMI.  Physical Exam  Pulmonary/Chest: Effort normal. No respiratory distress. Speaking in complete sentences Neurological: Pt is alert and oriented to person, place, and time. Speech is normal Psychiatric: Patient has a normal mood and affect. behavior is normal. Judgment and thought content normal.  No results found for this or any previous visit (from the past 72 hour(s)).  PHQ2/9: Depression screen [facility] 2/9 05/25/2019 02/22/2019 12/21/2018 12/07/2018  Decreased Interest 0 0 0 0  Down, Depressed, Hopeless 0 0 0 0   PHQ - 2 Score 0 0 0 0  Altered sleeping 0 0 0 -  Tired, decreased energy 0 0 0 -  Change in appetite 0 0 0 -  Feeling bad or failure about yourself  0 0 0 -  Trouble concentrating 0 0 0 -  Moving slowly or fidgety/restless 0 0 0 -  Suicidal thoughts 0 0 0 -  PHQ-9 Score 0 0 0 -  Difficult doing work/chores Not difficult at all Not difficult at all Not difficult at all -   PHQ-2/9 Result is negative.    Fall Risk: Fall Risk  05/25/2019 04/26/2019 03/15/2019 02/22/2019 02/08/2019  Falls in the past year? 0 0 0 0 0  Number falls in past yr: 0 - - 0 -  Injury with Fall? 0 - - 0 -  Follow up Falls evaluation completed - - - -    Assessment & Plan  1. Diabetes mellitus type 2 in obese ([place]) - Compliant with medications; needs labs; would like glucometer to use PRN.   - blood glucose meter kit and supplies KIT; Dispense based on patient and insurance preference. Use 1-2 times daily as needed.  Dispense: 1 each; Refill: 0 - Hemoglobin A1c - COMPLETE METABOLIC PANEL WITH GFR  2. Essential hypertension - Continue labetolol for now as she has been doing well on it, however will consider ACE or ARB after urine micro is returned - COMPLETE METABOLIC PANEL WITH GFR  3. Acquired hypothyroidism - TSH  4. Morbid obesity with BMI of 45.0-49.9, adult ([place]) - Discussed importance of 150 minutes of physical activity weekly, eat two servings of fish weekly, eat one serving of tree nuts ( cashews, pistachios, pecans, almonds.Marland Kitchen) every other day, eat 6 servings of fruit/vegetables daily and drink plenty of water and avoid sweet beverages.  - COMPLETE METABOLIC PANEL WITH GFR  5. Elevated transaminase level - COMPLETE METABOLIC PANEL WITH GFR  I discussed the assessment and treatment plan with the patient. The patient was provided an opportunity to ask questions and all were answered. The patient agreed with the plan and demonstrated an understanding of the instructions.   The patient was advised to  call back or seek an in-person evaluation if the symptoms worsen or if the condition fails to improve as anticipated.  I provided 23 minutes of non-face-to-face time during this encounter.  Hubbard Hartshorn, FNP

## 2019-05-26 ENCOUNTER — Encounter: Payer: Self-pay | Admitting: Family Medicine

## 2019-05-31 ENCOUNTER — Ambulatory Visit: Payer: PRIVATE HEALTH INSURANCE | Admitting: Emergency Medicine

## 2019-05-31 ENCOUNTER — Other Ambulatory Visit: Payer: Self-pay

## 2019-05-31 VITALS — BP 130/84 | HR 94 | Temp 97.8°F | Resp 16 | Ht 61.0 in | Wt 340.4 lb

## 2019-05-31 DIAGNOSIS — E119 Type 2 diabetes mellitus without complications: Secondary | ICD-10-CM

## 2019-05-31 NOTE — Progress Notes (Signed)
Patient came in for vital signs and have her Blood sugar checked. She stated that she could not afford a glucometer and one was provided to her with permission from [place] at [facility]. Contour Next EZ was given and I relayed to patient to have [employer] price the strips for her and give [place] a call back to get a prescription. . BS today was 137.

## 2019-06-04 ENCOUNTER — Encounter: Payer: Self-pay | Admitting: Family Medicine

## 2019-06-07 NOTE — Telephone Encounter (Signed)
Patient is schedule for first available appointment on 07/12/19 with CRS

## 2019-06-14 ENCOUNTER — Other Ambulatory Visit: Payer: Self-pay

## 2019-06-14 ENCOUNTER — Encounter: Payer: Self-pay | Admitting: Dietician

## 2019-06-14 ENCOUNTER — Encounter: Payer: Self-pay | Admitting: Family Medicine

## 2019-06-14 ENCOUNTER — Encounter: Payer: PRIVATE HEALTH INSURANCE | Attending: Obstetrics and Gynecology | Admitting: Dietician

## 2019-06-14 VITALS — Ht 62.0 in | Wt 341.1 lb

## 2019-06-14 DIAGNOSIS — E119 Type 2 diabetes mellitus without complications: Secondary | ICD-10-CM

## 2019-06-14 DIAGNOSIS — I10 Essential (primary) hypertension: Secondary | ICD-10-CM | POA: Insufficient documentation

## 2019-06-14 DIAGNOSIS — Z6841 Body Mass Index (BMI) 40.0 and over, adult: Secondary | ICD-10-CM | POA: Insufficient documentation

## 2019-06-14 NOTE — Patient Instructions (Addendum)
   When you eat anything starchy like noodles, rice, bread, doughy food like dumplings keep the portion to the size of a handful, the amount that will fit in your hand.  Drink only sugar free drinks to help keep blood sugar in control and save calories.   Stay active, move as much as possible during the day. Try using a step counting app on the phone and gradually increase the number of steps. Also try a short exercise or stretch break 1 or 2 times a day, maybe when watching TV.   Good job including some vegetables and protein foods, keep up the good work!

## 2019-06-14 NOTE — Progress Notes (Signed)
Medical Nutrition Therapy: Visit start time: 0930  end time: 1015  Assessment:  Diagnosis: obesity Past medical history: Type 2 diabetes, PCOS Psychosocial issues/ stress concerns: none   Current weight: 341.1lbs Height: 5'2" Medications, supplements: reconciled list in medical record  Progress and evaluation:   Patient reports increased intake of whole milk recently, helping with satiety   She states she has been eating out less often recently; except had biscuit today  Reports checking BG fasting this morning and result was 160mg /dl; she is testing sporadically due to anxiety around testing.   Weight loss of about 5lbs since previous visit on 04/26/19; total weight loss of 10.4lbs since visit on 01/04/19.  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 Dining out frequency: 1-2 meals per week.  Breakfast: today sausage biscuit and regular coke ("to wake up"), no diet coke available; usually Special K cereal with Fairlife milk; egg sandwich on toast Snack: none; occasional cereal bar Lunch: sometimes skips when not hungry, sometimes naps; or Ramen noodles no money for salad recently Snack: fruit Supper: protein + veg per husband ie chicken and dumplings homemade with carrots, celery, onion in stew; ate steak due to craving;  Snack: milk + 1 slice bread Beverages: flavored water, ice, grape juice, whole milk,   Nutrition Care Education: Topics covered: weight control Weight control: benefits of weight control in BG control, PCOS and fertility; reviewed importance of low sugar intake from foods and beverages; appropriate food portions from starchy foods; importance of physical activity and options for increasing activity Diabetes:  goals for BGs, appropriate carb intake and balance in terms of simple portion control  Nutritional Diagnosis:  [place]-2.2 Altered nutrition-related laboratory As related to  diabetes.  As evidenced by recent home BG test result of 160mg /dl fasting. [place]-3.3 Overweight/obesity As related to excess calories, limited activity, and hypothyroidism.  As evidenced by patient with current BMI of 62, working on diet and lifestyle changes for ongoing weight loss.  Intervention:   Instruction and discussion as noted above.  Commended patient for changes made.  Updated goals to promote ongoing weight loss.  Patient requested next follow-up in 2 months.  Education Materials given:  Marland Kitchen Exercise packet (ADA) . Fitness Fun . Goals/ instructions   Learner/ who was taught:  . Patient  . Spouse/ partner   Level of understanding: Marland Kitchen Verbalizes/ demonstrates competency   Demonstrated degree of understanding via:   Teach back Learning barriers: . None  Willingness to learn/ readiness for change: . Eager, change in progress   Monitoring and Evaluation:  Dietary intake, exercise, BG control, and body weight      follow up: 08/16/19 at 9:30am

## 2019-06-16 MED ORDER — GLUCOSE BLOOD VI STRP: ORAL_STRIP | 12 refills | Status: AC

## 2019-06-16 NOTE — Addendum Note (Signed)
Addended by: Hubbard Hartshorn on: 06/16/2019 09:09 AM   Modules accepted: Orders

## 2019-06-21 ENCOUNTER — Encounter: Payer: Self-pay | Admitting: Family Medicine

## 2019-06-23 ENCOUNTER — Other Ambulatory Visit: Payer: Self-pay | Admitting: Family Medicine

## 2019-06-23 ENCOUNTER — Encounter: Payer: Self-pay | Admitting: Family Medicine

## 2019-06-23 DIAGNOSIS — E038 Other specified hypothyroidism: Secondary | ICD-10-CM

## 2019-06-23 NOTE — Telephone Encounter (Signed)
She has to come in for labs.  I am sending in 7 day supply. Will send in more once labs are drawn and resulted.

## 2019-07-05 ENCOUNTER — Encounter: Payer: Self-pay | Admitting: Family Medicine

## 2019-07-05 ENCOUNTER — Other Ambulatory Visit: Payer: Self-pay

## 2019-07-05 ENCOUNTER — Ambulatory Visit (INDEPENDENT_AMBULATORY_CARE_PROVIDER_SITE_OTHER): Payer: PRIVATE HEALTH INSURANCE | Admitting: Family Medicine

## 2019-07-05 VITALS — BP 132/78 | HR 104 | Temp 98.6°F | Resp 14 | Ht 61.0 in | Wt 338.6 lb

## 2019-07-05 DIAGNOSIS — E669 Obesity, unspecified: Secondary | ICD-10-CM

## 2019-07-05 DIAGNOSIS — R21 Rash and other nonspecific skin eruption: Secondary | ICD-10-CM

## 2019-07-05 DIAGNOSIS — E1169 Type 2 diabetes mellitus with other specified complication: Secondary | ICD-10-CM | POA: Diagnosis not present

## 2019-07-05 DIAGNOSIS — Z23 Encounter for immunization: Secondary | ICD-10-CM

## 2019-07-05 MED ORDER — MICONAZOLE NITRATE 2 % EX CREA: 1.0000 "application " | TOPICAL_CREAM | Freq: Two times a day (BID) | CUTANEOUS | 0 refills | Status: DC

## 2019-07-05 MED ORDER — FLUCONAZOLE 150 MG PO TABS: 150.0000 mg | ORAL_TABLET | Freq: Once | ORAL | 0 refills | Status: AC

## 2019-07-05 NOTE — Progress Notes (Signed)
Patient ID: Leslie Allison, female    DOB: [birth year], 23 y.o.   MRN: [id number]  PCP: Hubbard Hartshorn, FNP  Chief Complaint  Patient presents with  . Rash    inside bilateral legs with redness and itching  . Breast Problem    left red and hot to touch    Subjective:   Leslie Allison is a 23 y.o. female, presents to clinic with CC of the following:  Rash This is a new problem. The current episode started in the past 7 days. The problem has been waxing and waning since onset. Location: skin folds to b/l groin thighs. The rash is characterized by itchiness and redness. Pertinent negatives include no anorexia, congestion, cough, diarrhea, eye pain, facial edema, fatigue, fever, joint pain, nail changes, rhinorrhea, shortness of breath, sore throat or vomiting. Past treatments include topical steroids. The treatment provided moderate relief.  She also complains of some skin changes to her left breast around where her birth control patches.  She states that the skin is numb and a little swollen and red, around where she puts birth control patch.  She is very anxious about lumps, nipple change, she is constantly pinching her left breast No nipple discharge, no lumps/lymth nodes No infection, scratches, bug bits. Mom was 60 at time of breast CA dx   Diabetes Mellitus Type II: DM dx 1 years ago Currently managing with metformin 500 mg BID Pt notes good med compliance Pt has no SE or concerns from meds. Fasting CBGs typically run 160-200  When it is high around 200 she goes on a walk to get it down She is checking 2-3 x a week   No hypoglycemic episodes Denies: , polydipsia, polyphagia, vision changes, or neuropathy She is having itchy rashes to skin folds, she is peeing a lot - no dysuria - nocturia 2x regularly Recent pertinent labs: Lab Results  Component Value Date   HGBA1C 7.2 (H) 02/25/2019   HGBA1C 7.4 (H) 11/16/2018      Component Value Date/Time   NA 135 02/25/2019 0821    K 4.1 02/25/2019 0821   CL 102 02/25/2019 0821   CO2 27 02/25/2019 0821   GLUCOSE 161 (H) 02/25/2019 0821   BUN 8 02/25/2019 0821   CREATININE 0.71 02/25/2019 0821   CALCIUM 9.2 02/25/2019 0821   PROT 7.0 02/25/2019 0821   ALBUMIN 3.9 04/18/2018 1906   AST 40 (H) 02/25/2019 0821   ALT 57 (H) 02/25/2019 0821   ALKPHOS 64 04/18/2018 1906   BILITOT 0.5 02/25/2019 0821   GFRNONAA 122 02/25/2019 0821   GFRAA 141 02/25/2019 0821      Patient Active Problem List   Diagnosis Date Noted  . Diabetes mellitus type 2 in obese ([place]) 05/25/2019  . Essential hypertension 05/25/2019  . Acquired hypothyroidism 05/25/2019  . Elevated transaminase level 05/25/2019  . Irregular uterine bleeding 12/21/2018  . Abnormal thyroid blood test 12/21/2018  . PCO (polycystic ovaries) 12/10/2016  . Vaginal discharge 11/21/2016  . Amenorrhea 07/18/2016  . Morbid obesity with BMI of 45.0-49.9, adult ([place]) 07/18/2016  . Mentally challenged 07/18/2016  . Family history of breast cancer in mother 07/18/2016      Current Outpatient Medications:  .  albuterol (PROVENTIL HFA;VENTOLIN HFA) 108 (90 Base) MCG/ACT inhaler, Inhale 2 puffs into the lungs every 6 (six) hours as needed for wheezing or shortness of breath., Disp: 1 Inhaler, Rfl: 2 .  blood glucose meter kit and supplies KIT, Dispense based on patient  and insurance preference. Use 1-2 times daily as needed., Disp: 1 each, Rfl: 0 .  EUTHYROX 25 MCG tablet, TAKE 1 & 1/2 (ONE & ONE-HALF) TABLETS BY MOUTH ONCE DAILY BEFORE BREAKFAST, Disp: 7 tablet, Rfl: 0 .  glucose blood test strip, Use as instructed, Disp: 100 each, Rfl: 12 .  ibuprofen (ADVIL,MOTRIN) 800 MG tablet, Take 1 tablet (800 mg total) by mouth every 8 (eight) hours as needed for moderate pain or cramping (with food)., Disp: 15 tablet, Rfl: 0 .  labetalol (NORMODYNE) 200 MG tablet, Take 1 tablet (200 mg total) by mouth 2 (two) times daily., Disp: 60 tablet, Rfl: 3 .  metFORMIN (GLUCOPHAGE) 500  MG tablet, Take 1 tablet (500 mg total) by mouth 2 (two) times daily with a meal., Disp: 180 tablet, Rfl: 1 .  norelgestromin-ethinyl estradiol (XULANE) 150-35 MCG/24HR transdermal patch, Place 1 patch onto the skin once a week., Disp: 3 patch, Rfl: 12   Allergies  Allergen Reactions  . Amoxicillin     Has patient had a PCN reaction causing immediate rash, facial/tongue/throat swelling, SOB or lightheadedness with hypotension: No Has patient had a PCN reaction causing severe rash involving mucus membranes or skin necrosis: No Has patient had a PCN reaction that required hospitalization: No Has patient had a PCN reaction occurring within the last 10 years: No If all of the above answers are "NO", then may proceed with Cephalosporin use.       Family History  Problem Relation Age of Onset  . Breast cancer Mother   . Ovarian cancer Neg Hx   . Colon cancer Neg Hx   . Diabetes Neg Hx   . Heart disease Neg Hx      Social History   Socioeconomic History  . Marital status: Married    Spouse name: Martinique Gurr  . Number of children: 0  . Years of education: Not on file  . Highest education level: High school graduate  Occupational History  . Occupation: unemployed    Comment: currently applying for disability  Social Needs  . Financial resource strain: Not hard at all  . Food insecurity    Worry: Never true    Inability: Never true  . Transportation needs    Medical: No    Non-medical: No  Tobacco Use  . Smoking status: Never Smoker  . Smokeless tobacco: Never Used  Substance and Sexual Activity  . Alcohol use: Yes    Comment: occasional wine  . Drug use: No  . Sexual activity: Yes    Partners: Male    Birth control/protection: Patch  Lifestyle  . Physical activity    Days per week: 5 days    Minutes per session: 10 min  . Stress: Not at all  Relationships  . Social Herbalist on phone: More than three times a week    Gets together: Never    Attends  religious service: Never    Active member of club or organization: No    Attends meetings of clubs or organizations: Never    Relationship status: Married  . Intimate partner violence    Fear of current or ex partner: No    Emotionally abused: No    Physically abused: No    Forced sexual activity: No  Other Topics Concern  . Not on file  Social History Narrative  . Not on file    I personally reviewed active problem list, medication list, allergies, family history, social history, health maintenance,  notes from last encounter, lab results with the patient/caregiver today.  Review of Systems  Constitutional: Negative.  Negative for fatigue and fever.  HENT: Negative.  Negative for congestion, rhinorrhea and sore throat.   Eyes: Negative.  Negative for pain.  Respiratory: Negative.  Negative for cough and shortness of breath.   Cardiovascular: Negative.   Gastrointestinal: Negative.  Negative for anorexia, diarrhea and vomiting.  Endocrine: Negative.   Genitourinary: Negative.   Musculoskeletal: Negative.  Negative for joint pain.  Skin: Positive for rash. Negative for nail changes.  Allergic/Immunologic: Negative.   Neurological: Negative.   Hematological: Negative.   Psychiatric/Behavioral: Negative.   All other systems reviewed and are negative.      Objective:   Vitals:   07/05/19 1122  BP: 132/78  Pulse: (!) 104  Resp: 14  Temp: 98.6 F (37 C)  SpO2: 99%  Weight: (!) 338 lb 9.6 oz (153.6 kg)  Height: _0  (1.549 m)    Body mass index is 63.98 kg/m.  Physical Exam Vitals signs and nursing note reviewed.  Constitutional:      General: She is not in acute distress.    Appearance: Normal appearance. She is well-developed. She is obese. She is not ill-appearing, toxic-appearing or diaphoretic.     Interventions: Face mask in place.  HENT:     Head: Normocephalic and atraumatic.     Right Ear: External ear normal.     Left Ear: External ear normal.  Eyes:      General: Lids are normal. No scleral icterus.       Right eye: No discharge.        Left eye: No discharge.     Conjunctiva/sclera: Conjunctivae normal.  Neck:     Musculoskeletal: Normal range of motion and neck supple.     Trachea: Phonation normal. No tracheal deviation.  Cardiovascular:     Rate and Rhythm: Normal rate and regular rhythm.     Pulses: Normal pulses.          Radial pulses are 2+ on the right side and 2+ on the left side.       Posterior tibial pulses are 2+ on the right side and 2+ on the left side.     Heart sounds: Normal heart sounds. No murmur. No friction rub. No gallop.   Pulmonary:     Effort: Pulmonary effort is normal. No respiratory distress.     Breath sounds: Normal breath sounds. No stridor. No wheezing, rhonchi or rales.  Chest:     Chest wall: No tenderness.  Abdominal:     General: Bowel sounds are normal. There is no distension.     Palpations: Abdomen is soft.     Tenderness: There is no abdominal tenderness. There is no guarding or rebound.  Musculoskeletal: Normal range of motion.        General: No deformity.     Right lower leg: No edema.     Left lower leg: No edema.  Lymphadenopathy:     Cervical: No cervical adenopathy.  Skin:    General: Skin is warm and dry.     Capillary Refill: Capillary refill takes less than 2 seconds.     Coloration: Skin is not jaundiced or pale.     Findings: No rash.     Comments: Birth control patch with visible adhesive to her left upper breast, surrounding area is mildly indurated and erythematous no warmth or tenderness  Patient complains of rash to her groin  but refuses to show me the area today  Neurological:     Mental Status: She is alert and oriented to person, place, and time.     Motor: No abnormal muscle tone.     Gait: Gait normal.  Psychiatric:        Attention and Perception: She is inattentive.        Speech: Speech normal.        Behavior: Behavior is uncooperative.      Results  for orders placed or performed in visit on 02/22/19  Hemoglobin A1c  Result Value Ref Range   Hgb A1c MFr Bld 7.2 (H) <5.7 % of total Hgb   Mean Plasma Glucose 160 (calc)   eAG (mmol/L) 8.9 (calc)  Lipid panel  Result Value Ref Range   Cholesterol 210 (H) <200 mg/dL   HDL 36 (L) > OR = 50 mg/dL   Triglycerides 367 (H) <150 mg/dL   LDL Cholesterol (Calc) 122 (H) mg/dL (calc)   Total CHOL/HDL Ratio 5.8 (H) <5.0 (calc)   Non-HDL Cholesterol (Calc) 174 (H) <130 mg/dL (calc)  TSH  Result Value Ref Range   TSH 8.10 (H) mIU/L  COMPLETE METABOLIC PANEL WITH GFR  Result Value Ref Range   Glucose, Bld 161 (H) 65 - 99 mg/dL   BUN 8 7 - 25 mg/dL   Creat 0.71 0.50 - 1.10 mg/dL   GFR, Est Non African American 122 > OR = 60 mL/min/1.21m   GFR, Est African American 141 > OR = 60 mL/min/1.746m  BUN/Creatinine Ratio NOT APPLICABLE 6 - 22 (calc)   Sodium 135 135 - 146 mmol/L   Potassium 4.1 3.5 - 5.3 mmol/L   Chloride 102 98 - 110 mmol/L   CO2 27 20 - 32 mmol/L   Calcium 9.2 8.6 - 10.2 mg/dL   Total Protein 7.0 6.1 - 8.1 g/dL   Albumin 4.1 3.6 - 5.1 g/dL   Globulin 2.9 1.9 - 3.7 g/dL (calc)   AG Ratio 1.4 1.0 - 2.5 (calc)   Total Bilirubin 0.5 0.2 - 1.2 mg/dL   Alkaline phosphatase (APISO) 58 31 - 125 U/L   AST 40 (H) 10 - 30 U/L   ALT 57 (H) 6 - 29 U/L        Assessment & Plan:    1. Rash and nonspecific skin eruption Treat with topical miconazole cream, keep skin folds dry, Diflucan if topical medicines or not effective  There is redness and skin changes to her left breast suspect it may be a reaction to the patch to the area instructed her to move it to a different location, follow-up if not improving can put some topical steroids to that area 1-2 times a day for the next 7 days, follow-up if not improving I do not feel any nodules lymph nodes, does not appear to be an abscess or cellulitis  Suspect the rash to her groin may be intertrigo due to obesity skin fold and some diabetes  will check her labs since she is reporting that her blood sugar is high  2. Diabetes mellitus type 2 in obese ([facility] stating that their diabetes gives her some urinary symptoms recheck and see if she is spilling sugar into her urine she also states that she chronically has abnormal urinalysis and hematuria A1c, CMP and UA done today  3. Need for influenza vaccination - Flu Vaccine QUAD 6+ mos PF IM (Fluarix Quad PF)  Delsa Grana, PA-C 07/05/19 11:44 AM

## 2019-07-05 NOTE — Patient Instructions (Addendum)
For rash to thighs/groin use ketoconazole or miconazole 2% cream topically to affected area 2x a day for a week  If this doesn't improve the rash then I want you to take the pill I prescribed  I will call you with your labs  For your breast skin - move your patch to a different area and call me to be rechecked if you have any continued skin redness or swelling, lumps, drainage or pain

## 2019-07-06 LAB — URINALYSIS, ROUTINE W REFLEX MICROSCOPIC
Bacteria, UA: NONE SEEN /HPF
Bilirubin Urine: NEGATIVE
Glucose, UA: NEGATIVE
Ketones, ur: NEGATIVE
Nitrite: NEGATIVE
Specific Gravity, Urine: 1.017 (ref 1.001–1.03)
WBC, UA: >=60 /HPF — AB (ref 0–5)
pH: 6.5 (ref 5.0–8.0)

## 2019-07-06 LAB — HEMOGLOBIN A1C
Hgb A1c MFr Bld: 6.4 % of total Hgb — ABNORMAL HIGH (ref <5.7)
Mean Plasma Glucose: 137 (calc)
eAG (mmol/L): 7.6 (calc)

## 2019-07-06 LAB — COMPLETE METABOLIC PANEL WITH GFR
AG Ratio: 1.6 (calc) (ref 1.0–2.5)
ALT: 33 U/L — ABNORMAL HIGH (ref 6–29)
AST: 31 U/L — ABNORMAL HIGH (ref 10–30)
Albumin: 4.4 g/dL (ref 3.6–5.1)
Alkaline phosphatase (APISO): 56 U/L (ref 31–125)
BUN: 12 mg/dL (ref 7–25)
CO2: 28 mmol/L (ref 20–32)
Calcium: 9.5 mg/dL (ref 8.6–10.2)
Chloride: 101 mmol/L (ref 98–110)
Creat: 0.77 mg/dL (ref 0.50–1.10)
GFR, Est African American: 128 mL/min/{1.73_m2} (ref >=60)
GFR, Est Non African American: 110 mL/min/{1.73_m2} (ref >=60)
Globulin: 2.7 g/dL (calc) (ref 1.9–3.7)
Glucose, Bld: 191 mg/dL — ABNORMAL HIGH (ref 65–99)
Potassium: 4.1 mmol/L (ref 3.5–5.3)
Sodium: 140 mmol/L (ref 135–146)
Total Bilirubin: 0.5 mg/dL (ref 0.2–1.2)
Total Protein: 7.1 g/dL (ref 6.1–8.1)

## 2019-07-12 ENCOUNTER — Ambulatory Visit: Payer: PRIVATE HEALTH INSURANCE | Admitting: Obstetrics and Gynecology

## 2019-07-29 ENCOUNTER — Other Ambulatory Visit: Payer: Self-pay | Admitting: Family Medicine

## 2019-07-29 DIAGNOSIS — E038 Other specified hypothyroidism: Secondary | ICD-10-CM

## 2019-07-29 NOTE — Telephone Encounter (Signed)
Requested medication (s) are due for refill today: yes  Requested medication (s) are on the active medication list: yes  Last refill:  06/23/2019  Future visit scheduled: yes  Notes to clinic: review for refill and qty   Requested Prescriptions  Pending Prescriptions Disp Refills   EUTHYROX 25 MCG tablet [Pharmacy Med Name: Euthyrox 25 MCG Oral Tablet] 7 tablet 0    Sig: TAKE 1 & 1/2 (ONE & ONE-HALF) TABLETS BY MOUTH ONCE DAILY BEFORE BREAKFAST MUST  COME  FOR  LABS     Endocrinology:  Hypothyroid Agents Failed - 07/29/2019 11:42 AM      Failed - TSH needs to be rechecked within 3 months after an abnormal result. Refill until TSH is due.      Failed - TSH in normal range and within 360 days    TSH  Date Value Ref Range Status  02/25/2019 8.10 (H) mIU/L Final    Comment:              Reference Range .           > or = 20 Years  0.40-4.50 .                Pregnancy Ranges           First trimester    0.26-2.66           Second trimester   0.55-2.73           Third trimester    0.43-2.91          Passed - Valid encounter within last 12 months    Recent Outpatient Visits          3 weeks ago Rash and nonspecific skin eruption   [facility] [place], Kristeen Miss, PA-C   2 months ago Diabetes mellitus type 2 in obese [facility])   [place], FNP   5 months ago Diabetes mellitus type 2 in obese [facility])   [place], FNP   7 months ago Abnormal thyroid blood test   [facility] Hubbard Hartshorn, [place]      Future Appointments            In 3 weeks Hubbard Hartshorn, [facility], [facility]

## 2019-07-30 NOTE — Telephone Encounter (Signed)
TSH was not checked in her recent labs - please call to come back in ASAP next week for recheck.

## 2019-08-10 NOTE — Telephone Encounter (Signed)
Left message for patient

## 2019-08-13 ENCOUNTER — Telehealth: Payer: Self-pay | Admitting: Dietician

## 2019-08-13 NOTE — Telephone Encounter (Signed)
Called patient to confirm appointment for 11/2 at [facility]; she needed to reschedule, so moved the appointment to 09/06/19 at 10am.

## 2019-08-16 ENCOUNTER — Ambulatory Visit: Payer: PRIVATE HEALTH INSURANCE | Admitting: Dietician

## 2019-08-23 ENCOUNTER — Ambulatory Visit (INDEPENDENT_AMBULATORY_CARE_PROVIDER_SITE_OTHER): Payer: PRIVATE HEALTH INSURANCE | Admitting: Family Medicine

## 2019-08-23 ENCOUNTER — Other Ambulatory Visit: Payer: Self-pay

## 2019-08-23 ENCOUNTER — Encounter: Payer: Self-pay | Admitting: Family Medicine

## 2019-08-23 VITALS — BP 128/84 | HR 98 | Temp 97.9°F | Resp 16 | Ht 61.0 in | Wt 336.9 lb

## 2019-08-23 DIAGNOSIS — E039 Hypothyroidism, unspecified: Secondary | ICD-10-CM | POA: Diagnosis not present

## 2019-08-23 DIAGNOSIS — E785 Hyperlipidemia, unspecified: Secondary | ICD-10-CM

## 2019-08-23 DIAGNOSIS — I10 Essential (primary) hypertension: Secondary | ICD-10-CM | POA: Diagnosis not present

## 2019-08-23 DIAGNOSIS — Z6841 Body Mass Index (BMI) 40.0 and over, adult: Secondary | ICD-10-CM

## 2019-08-23 DIAGNOSIS — E1169 Type 2 diabetes mellitus with other specified complication: Secondary | ICD-10-CM

## 2019-08-23 DIAGNOSIS — E669 Obesity, unspecified: Secondary | ICD-10-CM

## 2019-08-23 NOTE — Patient Instructions (Signed)
DASH Eating Plan DASH stands for "Dietary Approaches to Stop Hypertension." The DASH eating plan is a healthy eating plan that has been shown to reduce high blood pressure (hypertension). It may also reduce your risk for type 2 diabetes, heart disease, and stroke. The DASH eating plan may also help with weight loss. What are tips for following this plan?  General guidelines  Avoid eating more than 2,300 mg (milligrams) of salt (sodium) a day. If you have hypertension, you may need to reduce your sodium intake to 1,500 mg a day.  Limit alcohol intake to no more than 1 drink a day for nonpregnant women and 2 drinks a day for men. One drink equals 12 oz of beer, 5 oz of wine, or 1 oz of hard liquor.  Work with your health care provider to maintain a healthy body weight or to lose weight. Ask what an ideal weight is for you.  Get at least 30 minutes of exercise that causes your heart to beat faster (aerobic exercise) most days of the week. Activities may include walking, swimming, or biking.  Work with your health care provider or diet and nutrition specialist (dietitian) to adjust your eating plan to your individual calorie needs. Reading food labels   Check food labels for the amount of sodium per serving. Choose foods with less than 5 percent of the Daily Value of sodium. Generally, foods with less than 300 mg of sodium per serving fit into this eating plan.  To find whole grains, look for the word "whole" as the first word in the ingredient list. Shopping  Buy products labeled as "low-sodium" or "no salt added."  Buy fresh foods. Avoid canned foods and premade or frozen meals. Cooking  Avoid adding salt when cooking. Use salt-free seasonings or herbs instead of table salt or sea salt. Check with your health care provider or pharmacist before using salt substitutes.  Do not fry foods. Cook foods using healthy methods such as baking, boiling, grilling, and broiling instead.  Cook with  heart-healthy oils, such as olive, canola, soybean, or sunflower oil. Meal planning  Eat a balanced diet that includes: ? 5 or more servings of fruits and vegetables each day. At each meal, try to fill half of your plate with fruits and vegetables. ? Up to 6-8 servings of whole grains each day. ? Less than 6 oz of lean meat, poultry, or fish each day. A 3-oz serving of meat is about the same size as a deck of cards. One egg equals 1 oz. ? 2 servings of low-fat dairy each day. ? A serving of nuts, seeds, or beans 5 times each week. ? Heart-healthy fats. Healthy fats called Omega-3 fatty acids are found in foods such as flaxseeds and coldwater fish, like sardines, salmon, and mackerel.  Limit how much you eat of the following: ? Canned or prepackaged foods. ? Food that is high in trans fat, such as fried foods. ? Food that is high in saturated fat, such as fatty meat. ? Sweets, desserts, sugary drinks, and other foods with added sugar. ? Full-fat dairy products.  Do not salt foods before eating.  Try to eat at least 2 vegetarian meals each week.  Eat more home-cooked food and less restaurant, buffet, and fast food.  When eating at a restaurant, ask that your food be prepared with less salt or no salt, if possible. What foods are recommended? The items listed may not be a complete list. Talk with your dietitian about   what dietary choices are best for you. Grains Whole-grain or whole-wheat bread. Whole-grain or whole-wheat pasta. Brown rice. Oatmeal. Quinoa. Bulgur. Whole-grain and low-sodium cereals. Pita bread. Low-fat, low-sodium crackers. Whole-wheat flour tortillas. Vegetables Fresh or frozen vegetables (raw, steamed, roasted, or grilled). Low-sodium or reduced-sodium tomato and vegetable juice. Low-sodium or reduced-sodium tomato sauce and tomato paste. Low-sodium or reduced-sodium canned vegetables. Fruits All fresh, dried, or frozen fruit. Canned fruit in natural juice (without  added sugar). Meat and other protein foods Skinless chicken or turkey. Ground chicken or turkey. Pork with fat trimmed off. Fish and seafood. Egg whites. Dried beans, peas, or lentils. Unsalted nuts, nut butters, and seeds. Unsalted canned beans. Lean cuts of beef with fat trimmed off. Low-sodium, lean deli meat. Dairy Low-fat (1%) or fat-free (skim) milk. Fat-free, low-fat, or reduced-fat cheeses. Nonfat, low-sodium ricotta or cottage cheese. Low-fat or nonfat yogurt. Low-fat, low-sodium cheese. Fats and oils Soft margarine without trans fats. Vegetable oil. Low-fat, reduced-fat, or light mayonnaise and salad dressings (reduced-sodium). Canola, safflower, olive, soybean, and sunflower oils. Avocado. Seasoning and other foods Herbs. Spices. Seasoning mixes without salt. Unsalted popcorn and pretzels. Fat-free sweets. What foods are not recommended? The items listed may not be a complete list. Talk with your dietitian about what dietary choices are best for you. Grains Baked goods made with fat, such as croissants, muffins, or some breads. Dry pasta or rice meal packs. Vegetables Creamed or fried vegetables. Vegetables in a cheese sauce. Regular canned vegetables (not low-sodium or reduced-sodium). Regular canned tomato sauce and paste (not low-sodium or reduced-sodium). Regular tomato and vegetable juice (not low-sodium or reduced-sodium). Pickles. Olives. Fruits Canned fruit in a light or heavy syrup. Fried fruit. Fruit in cream or butter sauce. Meat and other protein foods Fatty cuts of meat. Ribs. Fried meat. Bacon. Sausage. Bologna and other processed lunch meats. Salami. Fatback. Hotdogs. Bratwurst. Salted nuts and seeds. Canned beans with added salt. Canned or smoked fish. Whole eggs or egg yolks. Chicken or turkey with skin. Dairy Whole or 2% milk, cream, and half-and-half. Whole or full-fat cream cheese. Whole-fat or sweetened yogurt. Full-fat cheese. Nondairy creamers. Whipped toppings.  Processed cheese and cheese spreads. Fats and oils Butter. Stick margarine. Lard. Shortening. Ghee. Bacon fat. Tropical oils, such as coconut, palm kernel, or palm oil. Seasoning and other foods Salted popcorn and pretzels. Onion salt, garlic salt, seasoned salt, table salt, and sea salt. Worcestershire sauce. Tartar sauce. Barbecue sauce. Teriyaki sauce. Soy sauce, including reduced-sodium. Steak sauce. Canned and packaged gravies. Fish sauce. Oyster sauce. Cocktail sauce. Horseradish that you find on the shelf. Ketchup. Mustard. Meat flavorings and tenderizers. Bouillon cubes. Hot sauce and Tabasco sauce. Premade or packaged marinades. Premade or packaged taco seasonings. Relishes. Regular salad dressings. Where to find more information:  National Heart, Lung, and Blood Institute: www.nhlbi.nih.gov  American Heart Association: www.heart.org Summary  The DASH eating plan is a healthy eating plan that has been shown to reduce high blood pressure (hypertension). It may also reduce your risk for type 2 diabetes, heart disease, and stroke.  With the DASH eating plan, you should limit salt (sodium) intake to 2,300 mg a day. If you have hypertension, you may need to reduce your sodium intake to 1,500 mg a day.  When on the DASH eating plan, aim to eat more fresh fruits and vegetables, whole grains, lean proteins, low-fat dairy, and heart-healthy fats.  Work with your health care provider or diet and nutrition specialist (dietitian) to adjust your eating plan to your   individual calorie needs. This information is not intended to replace advice given to you by your health care provider. Make sure you discuss any questions you have with your health care provider. Document Released: 09/19/2011 Document Revised: 09/12/2017 Document Reviewed: 09/23/2016 Elsevier Patient Education  2020 [employer].   Diabetes Mellitus and Nutrition, Adult When you have diabetes (diabetes mellitus), it is very  important to have healthy eating habits because your blood sugar (glucose) levels are greatly affected by what you eat and drink. Eating healthy foods in the appropriate amounts, at about the same times every day, can help you:  Control your blood glucose.  Lower your risk of heart disease.  Improve your blood pressure.  Reach or maintain a healthy weight. Every person with diabetes is different, and each person has different needs for a meal plan. Your health care provider may recommend that you work with a diet and nutrition specialist (dietitian) to make a meal plan that is best for you. Your meal plan may vary depending on factors such as:  The calories you need.  The medicines you take.  Your weight.  Your blood glucose, blood pressure, and cholesterol levels.  Your activity level.  Other health conditions you have, such as heart or kidney disease. How do carbohydrates affect me? Carbohydrates, also called carbs, affect your blood glucose level more than any other type of food. Eating carbs naturally raises the amount of glucose in your blood. Carb counting is a method for keeping track of how many carbs you eat. Counting carbs is important to keep your blood glucose at a healthy level, especially if you use insulin or take certain oral diabetes medicines. It is important to know how many carbs you can safely have in each meal. This is different for every person. Your dietitian can help you calculate how many carbs you should have at each meal and for each snack. Foods that contain carbs include:  Bread, cereal, rice, pasta, and crackers.  Potatoes and corn.  Peas, beans, and lentils.  Milk and yogurt.  Fruit and juice.  Desserts, such as cakes, cookies, ice cream, and candy. How does alcohol affect me? Alcohol can cause a sudden decrease in blood glucose (hypoglycemia), especially if you use insulin or take certain oral diabetes medicines. Hypoglycemia can be a  life-threatening condition. Symptoms of hypoglycemia (sleepiness, dizziness, and confusion) are similar to symptoms of having too much alcohol. If your health care provider says that alcohol is safe for you, follow these guidelines:  Limit alcohol intake to no more than 1 drink per day for nonpregnant women and 2 drinks per day for men. One drink equals 12 oz of beer, 5 oz of wine, or 1 oz of hard liquor.  Do not drink on an empty stomach.  Keep yourself hydrated with water, diet soda, or unsweetened iced tea.  Keep in mind that regular soda, juice, and other mixers may contain a lot of sugar and must be counted as carbs. What are tips for following this plan?  Reading food labels  Start by checking the serving size on the "Nutrition Facts" label of packaged foods and drinks. The amount of calories, carbs, fats, and other nutrients listed on the label is based on one serving of the item. Many items contain more than one serving per package.  Check the total grams (g) of carbs in one serving. You can calculate the number of servings of carbs in one serving by dividing the total carbs by 15.  For example, if a food has 30 g of total carbs, it would be equal to 2 servings of carbs.  Check the number of grams (g) of saturated and trans fats in one serving. Choose foods that have low or no amount of these fats.  Check the number of milligrams (mg) of salt (sodium) in one serving. Most people should limit total sodium intake to less than 2,300 mg per day.  Always check the nutrition information of foods labeled as "low-fat" or "nonfat". These foods may be higher in added sugar or refined carbs and should be avoided.  Talk to your dietitian to identify your daily goals for nutrients listed on the label. Shopping  Avoid buying canned, premade, or processed foods. These foods tend to be high in fat, sodium, and added sugar.  Shop around the outside edge of the grocery store. This includes fresh  fruits and vegetables, bulk grains, fresh meats, and fresh dairy. Cooking  Use low-heat cooking methods, such as baking, instead of high-heat cooking methods like deep frying.  Cook using healthy oils, such as olive, canola, or sunflower oil.  Avoid cooking with butter, cream, or high-fat meats. Meal planning  Eat meals and snacks regularly, preferably at the same times every day. Avoid going long periods of time without eating.  Eat foods high in fiber, such as fresh fruits, vegetables, beans, and whole grains. Talk to your dietitian about how many servings of carbs you can eat at each meal.  Eat 4-6 ounces (oz) of lean protein each day, such as lean meat, chicken, fish, eggs, or tofu. One oz of lean protein is equal to: ? 1 oz of meat, chicken, or fish. ? 1 egg. ?  cup of tofu.  Eat some foods each day that contain healthy fats, such as avocado, nuts, seeds, and fish. Lifestyle  Check your blood glucose regularly.  Exercise regularly as told by your health care provider. This may include: ? 150 minutes of moderate-intensity or vigorous-intensity exercise each week. This could be brisk walking, biking, or water aerobics. ? Stretching and doing strength exercises, such as yoga or weightlifting, at least 2 times a week.  Take medicines as told by your health care provider.  Do not use any products that contain nicotine or tobacco, such as cigarettes and e-cigarettes. If you need help quitting, ask your health care provider.  Work with a Veterinary surgeon or diabetes educator to identify strategies to manage stress and any emotional and social challenges. Questions to ask a health care provider  Do I need to meet with a diabetes educator?  Do I need to meet with a dietitian?  What number can I call if I have questions?  When are the best times to check my blood glucose? Where to find more information:  American Diabetes Association: diabetes.org  Academy of Nutrition and  Dietetics: www.eatright.[employer] of Diabetes and Digestive and Kidney Diseases (NIH): CarFlippers.tn Summary  A healthy meal plan will help you control your blood glucose and maintain a healthy lifestyle.  Working with a diet and nutrition specialist (dietitian) can help you make a meal plan that is best for you.  Keep in mind that carbohydrates (carbs) and alcohol have immediate effects on your blood glucose levels. It is important to count carbs and to use alcohol carefully. This information is not intended to replace advice given to you by your health care provider. Make sure you discuss any questions you have with your health care provider. Document  Released: 06/27/2005 Document Revised: 09/12/2017 Document Reviewed: 11/04/2016 Elsevier Patient Education  [place] and Cholesterol Restricted Eating Plan Getting too much fat and cholesterol in your diet may cause health problems. Choosing the right foods helps keep your fat and cholesterol at normal levels. This can keep you from getting certain diseases. Your doctor may recommend an eating plan that includes:  Total fat: ______% or less of total calories a day.  Saturated fat: ______% or less of total calories a day.  Cholesterol: less than _________mg a day.  Fiber: ______g a day. What are tips for following this plan? Meal planning  At meals, divide your plate into four equal parts: ? Fill one-half of your plate with vegetables and green salads. ? Fill one-fourth of your plate with whole grains. ? Fill one-fourth of your plate with low-fat (lean) protein foods.  Eat fish that is high in omega-3 fats at least two times a week. This includes mackerel, tuna, sardines, and salmon.  Eat foods that are high in fiber, such as whole grains, beans, apples, broccoli, carrots, peas, and barley. General tips   Work with your doctor to lose weight if you need to.  Avoid: ? Foods with added sugar.  ? Fried foods. ? Foods with partially hydrogenated oils.  Limit alcohol intake to no more than 1 drink a day for nonpregnant women and 2 drinks a day for men. One drink equals 12 oz of beer, 5 oz of wine, or 1 oz of hard liquor. Reading food labels  Check food labels for: ? Trans fats. ? Partially hydrogenated oils. ? Saturated fat (g) in each serving. ? Cholesterol (mg) in each serving. ? Fiber (g) in each serving.  Choose foods with healthy fats, such as: ? Monounsaturated fats. ? Polyunsaturated fats. ? Omega-3 fats.  Choose grain products that have whole grains. Look for the word "whole" as the first word in the ingredient list. Cooking  Cook foods using low-fat methods. These include baking, boiling, grilling, and broiling.  Eat more home-cooked foods. Eat at restaurants and buffets less often.  Avoid cooking using saturated fats, such as butter, cream, palm oil, palm kernel oil, and coconut oil. Recommended foods  Fruits  All fresh, canned (in natural juice), or frozen fruits. Vegetables  Fresh or frozen vegetables (raw, steamed, roasted, or grilled). Green salads. Grains  Whole grains, such as whole wheat or whole grain breads, crackers, cereals, and pasta. Unsweetened oatmeal, bulgur, barley, quinoa, or brown rice. Corn or whole wheat flour tortillas. Meats and other protein foods  Ground beef (85% or leaner), grass-fed beef, or beef trimmed of fat. Skinless chicken or [place]. Ground chicken or [place]. Pork trimmed of fat. All fish and seafood. Egg whites. Dried beans, peas, or lentils. Unsalted nuts or seeds. Unsalted canned beans. Nut butters without added sugar or oil. Dairy  Low-fat or nonfat dairy products, such as skim or 1% milk, 2% or reduced-fat cheeses, low-fat and fat-free ricotta or cottage cheese, or plain low-fat and nonfat yogurt. Fats and oils  Tub margarine without trans fats. Light or reduced-fat mayonnaise and salad dressings. Avocado. Olive,  canola, sesame, or safflower oils. The items listed above may not be a complete list of foods and beverages you can eat. Contact a dietitian for more information. Foods to avoid Fruits  Canned fruit in heavy syrup. Fruit in cream or butter sauce. Fried fruit. Vegetables  Vegetables cooked in cheese, cream, or butter sauce. Fried vegetables. Grains  White  bread. White pasta. White rice. Cornbread. Bagels, pastries, and croissants. Crackers and snack foods that contain trans fat and hydrogenated oils. Meats and other protein foods  Fatty cuts of meat. Ribs, chicken wings, bacon, sausage, bologna, salami, chitterlings, fatback, hot dogs, bratwurst, and packaged lunch meats. Liver and organ meats. Whole eggs and egg yolks. Chicken and [place] with skin. Fried meat. Dairy  Whole or 2% milk, cream, half-and-half, and cream cheese. Whole milk cheeses. Whole-fat or sweetened yogurt. Full-fat cheeses. Nondairy creamers and whipped toppings. Processed cheese, cheese spreads, and cheese curds. Beverages  Alcohol. Sugar-sweetened drinks such as sodas, lemonade, and fruit drinks. Fats and oils  Butter, stick margarine, lard, shortening, ghee, or bacon fat. Coconut, palm kernel, and palm oils. Sweets and desserts  Corn syrup, sugars, honey, and molasses. Candy. Jam and jelly. Syrup. Sweetened cereals. Cookies, pies, cakes, donuts, muffins, and ice cream. The items listed above may not be a complete list of foods and beverages you should avoid. Contact a dietitian for more information. Summary  Choosing the right foods helps keep your fat and cholesterol at normal levels. This can keep you from getting certain diseases.  At meals, fill one-half of your plate with vegetables and green salads.  Eat high-fiber foods, like whole grains, beans, apples, carrots, peas, and barley.  Limit added sugar, saturated fats, alcohol, and fried foods. This information is not intended to replace advice given to  you by your health care provider. Make sure you discuss any questions you have with your health care provider. Document Released: 03/31/2012 Document Revised: 06/03/2018 Document Reviewed: 06/17/2017 Elsevier Patient Education  2020 [employer]

## 2019-08-23 NOTE — Progress Notes (Signed)
Name: Leslie Allison   MRN: [id number]    DOB: [birth year]   Date:08/23/2019       Progress Note  Subjective  Chief Complaint  Chief Complaint  Patient presents with  . Diabetes    follow up  . Hypothyroidism  . Obesity    HPI  Diabetes mellitustype 2 Checking sugars?Yes - checking periodically; notes this morning her BG was 103. Does patient feel additional teaching/training would be helpful?She had CCM team involved for education, but stopped following up with them. Trying to limit white bread, white rice, white potatoes, sweets?Has not been doing well with her diet - blames this on stress and babysitting a 6yo child recently.  Trying to limit sweetened drinks like iced tea, soft drinks, sports drinks, fruit juices?has a sweet beverage about twice a week (orange juice and sweet tea) Checking feet every day/night?no Exercise: Not exercising currently Last eye exam:She had eye exam in early 2020 at [facility] Denies: polydipsia, polyphagia, polyuria, vision changes, or neuropathy. Most recent A1C: Lab Results  Component Value Date   HGBA1C 6.4 (H) 07/05/2019  Last CMP Results:  Urine Micro UTD?No - needs to complete this today.  Current Medication Management: Diabetic Medications: Metformin 578m BID ACEI/ARB:No - waiting on urine micro to determine need. Statin:No Aspirin therapy:Not Indicated  Obesity: She is down about 3lbs since last visit 1 month ago.  She states her heaviest weight was over 400lbs.  She is not exercising and is not following her diabetic diet.  Discussed at length with her again today.  Hypothyroidism: History:hypothyroidism Current Medication Regimen:37.537m synthroid - she reports poor compliance with several missed doses weekly.  Significant education is provided regarding this.  Current Symptoms:denies fatigue, weight changes, heat/cold intolerance, bowel/skin changes or CVS symptoms. Most recent results were still  abnormal back in May 2020, but she never obtained repeat labs despite our request.   HTN:She has been taking labetalol that was rx'd by GYN, did discussed possibly changing her to ACEI or ARB if warranted based on urine micro, but she never came in for urine micro testing after last visit - we will re-order this today. -doestake medications as prescribed - current regimen includes Labetalol 20069mID with no missed doses in a week. -taking medications as instructed, no medication side effects noted, no TIAs, no chest pain on exertion, no dyspnea on exertion, no swelling of ankles  - DASH diet discussed - ptdoes notfollow a low sodium diet; DASH diet is again discussed in detail.   Patient Active Problem List   Diagnosis Date Noted  . Diabetes mellitus type 2 in obese ([place]8/08/2019  . Essential hypertension 05/25/2019  . Acquired hypothyroidism 05/25/2019  . Elevated transaminase level 05/25/2019  . Irregular uterine bleeding 12/21/2018  . Abnormal thyroid blood test 12/21/2018  . PCO (polycystic ovaries) 12/10/2016  . Vaginal discharge 11/21/2016  . Amenorrhea 07/18/2016  . Morbid obesity with BMI of 45.0-49.9, adult ([place]0/02/2016  . Mentally challenged 07/18/2016  . Family history of breast cancer in mother 07/18/2016    Past Surgical History:  Procedure Laterality Date  . NO PAST SURGERIES      Family History  Problem Relation Age of Onset  . Breast cancer Mother   . Ovarian cancer Neg Hx   . Colon cancer Neg Hx   . Diabetes Neg Hx   . Heart disease Neg Hx     Social History   Socioeconomic History  . Marital status: Married    Spouse name:  Martinique Buccieri  . Number of children: 0  . Years of education: Not on file  . Highest education level: High school graduate  Occupational History  . Occupation: unemployed    Comment: currently applying for disability  Social Needs  . Financial resource strain: Not hard at all  . Food insecurity    Worry: Never true     Inability: Never true  . Transportation needs    Medical: No    Non-medical: No  Tobacco Use  . Smoking status: Never Smoker  . Smokeless tobacco: Never Used  Substance and Sexual Activity  . Alcohol use: Yes    Comment: occasional wine  . Drug use: No  . Sexual activity: Yes    Partners: Male    Birth control/protection: Patch  Lifestyle  . Physical activity    Days per week: 5 days    Minutes per session: 10 min  . Stress: Not at all  Relationships  . Social Herbalist on phone: More than three times a week    Gets together: Never    Attends religious service: Never    Active member of club or organization: No    Attends meetings of clubs or organizations: Never    Relationship status: Married  . Intimate partner violence    Fear of current or ex partner: No    Emotionally abused: No    Physically abused: No    Forced sexual activity: No  Other Topics Concern  . Not on file  Social History Narrative  . Not on file     Current Outpatient Medications:  .  labetalol (NORMODYNE) 200 MG tablet, Take 1 tablet (200 mg total) by mouth 2 (two) times daily., Disp: 60 tablet, Rfl: 3 .  metFORMIN (GLUCOPHAGE) 500 MG tablet, Take 1 tablet (500 mg total) by mouth 2 (two) times daily with a meal., Disp: 180 tablet, Rfl: 1 .  miconazole (MICOTIN) 2 % cream, Apply 1 application topically 2 (two) times daily., Disp: 28.35 g, Rfl: 0 .  norelgestromin-ethinyl estradiol (XULANE) 150-35 MCG/24HR transdermal patch, Place 1 patch onto the skin once a week., Disp: 3 patch, Rfl: 12 .  albuterol (PROVENTIL HFA;VENTOLIN HFA) 108 (90 Base) MCG/ACT inhaler, Inhale 2 puffs into the lungs every 6 (six) hours as needed for wheezing or shortness of breath., Disp: 1 Inhaler, Rfl: 2 .  blood glucose meter kit and supplies KIT, Dispense based on patient and insurance preference. Use 1-2 times daily as needed. (Patient not taking: Reported on 08/23/2019), Disp: 1 each, Rfl: 0 .  EUTHYROX 25 MCG  tablet, TAKE 1 & 1/2 (ONE & ONE-HALF) TABLETS BY MOUTH ONCE DAILY BEFORE BREAKFAST MUST  COME  FOR  LABS, Disp: 7 tablet, Rfl: 0 .  glucose blood test strip, Use as instructed (Patient not taking: Reported on 08/23/2019), Disp: 100 each, Rfl: 12 .  ibuprofen (ADVIL,MOTRIN) 800 MG tablet, Take 1 tablet (800 mg total) by mouth every 8 (eight) hours as needed for moderate pain or cramping (with food). (Patient not taking: Reported on 08/23/2019), Disp: 15 tablet, Rfl: 0  Allergies  Allergen Reactions  . Amoxicillin     Has patient had a PCN reaction causing immediate rash, facial/tongue/throat swelling, SOB or lightheadedness with hypotension: No Has patient had a PCN reaction causing severe rash involving mucus membranes or skin necrosis: No Has patient had a PCN reaction that required hospitalization: No Has patient had a PCN reaction occurring within the last 10 years: No  If all of the above answers are "NO", then may proceed with Cephalosporin use.      I personally reviewed active problem list, medication list, allergies, notes from last encounter, lab results with the patient/caregiver today.   ROS  Constitutional: Negative for fever or weight change.  Respiratory: Negative for cough and shortness of breath.   Cardiovascular: Negative for chest pain or palpitations.  Gastrointestinal: Negative for abdominal pain, no bowel changes.  Musculoskeletal: Negative for gait problem or joint swelling.  Skin: Negative for rash.  Neurological: Negative for dizziness or headache.  No other specific complaints in a complete review of systems (except as listed in HPI above).  Objective  Vitals:   08/23/19 0801  BP: 128/84  Pulse: 98  Resp: 16  Temp: 97.9 F (36.6 C)  TempSrc: Temporal  SpO2: 97%  Weight: (!) 336 lb 14.4 oz (152.8 kg)  Height: 5' 1"  (1.549 m)   Body mass index is 63.66 kg/m.  Physical Exam  Constitutional: Patient appears well-developed and well-nourished. No  distress.  HENT: Head: Normocephalic and atraumatic.  Neck: Normal range of motion. Neck supple. No JVD present. No thyromegaly present.  Cardiovascular: Normal rate, regular rhythm and normal heart sounds.  No murmur heard. No BLE edema. Pulmonary/Chest: Effort normal and breath sounds normal. No respiratory distress. Musculoskeletal: Normal range of motion, no joint effusions. No gross deformities Neurological: Pt is alert and oriented to person, place, and time. No cranial nerve deficit. Coordination, balance, strength, speech and gait are normal.  Skin: Skin is warm and dry. No rash noted. No erythema.  Psychiatric: Patient has a normal mood and affect. behavior is normal. Judgment and thought content normal.  No results found for this or any previous visit (from the past 72 hour(s)).  PHQ2/9: Depression screen [facility] 2/9 08/23/2019 07/05/2019 05/25/2019 02/22/2019 12/21/2018  Decreased Interest 0 0 0 0 0  Down, Depressed, Hopeless 0 0 0 0 0  PHQ - 2 Score 0 0 0 0 0  Altered sleeping 0 0 0 0 0  Tired, decreased energy 0 0 0 0 0  Change in appetite 0 0 0 0 0  Feeling bad or failure about yourself  0 0 0 0 0  Trouble concentrating 0 0 0 0 0  Moving slowly or fidgety/restless 0 0 0 0 0  Suicidal thoughts 0 0 0 0 0  PHQ-9 Score 0 0 0 0 0  Difficult doing work/chores Not difficult at all Not difficult at all Not difficult at all Not difficult at all Not difficult at all   PHQ-2/9 Result is negative.    Fall Risk: Fall Risk  08/23/2019 07/05/2019 06/14/2019 05/25/2019 04/26/2019  Falls in the past year? 0 0 0 0 0  Number falls in past yr: 0 0 - 0 -  Injury with Fall? 0 0 - 0 -  Follow up Falls evaluation completed - - Falls evaluation completed -   Assessment & Plan  1. Diabetes mellitus type 2 in obese (HCC) - Last A1C 1 month ago was at goal, maintain current regimen.  Due for urine Micro today - Microalbumin / creatinine urine ratio  2. Morbid obesity with BMI of 45.0-49.9, adult ([place])  - Significant education.  Discussed importance of 150 minutes of physical activity weekly, eat two servings of fish weekly, eat one serving of tree nuts ( cashews, pistachios, pecans, almonds.Marland Kitchen) every other day, eat 6 servings of fruit/vegetables daily and drink plenty of water and avoid sweet beverages.  3. Essential hypertension - Compliant with regimen, at goal today - Microalbumin / creatinine urine ratio  4. Acquired hypothyroidism - Discussed compliance in great detail with the patient - improved compliance can help with weight, cholesterol, and mood. - TSH  5. Hyperlipidemia associated with type 2 diabetes mellitus ([place]) - Will consider Statin therapy is warranted.  Discussed increased risk of heart attack and stroke based on co-morbid conditions and need for medication compliance and dietary/exercise changes. - Lipid panel

## 2019-08-24 ENCOUNTER — Other Ambulatory Visit: Payer: Self-pay | Admitting: Family Medicine

## 2019-08-24 DIAGNOSIS — E1129 Type 2 diabetes mellitus with other diabetic kidney complication: Secondary | ICD-10-CM

## 2019-08-24 DIAGNOSIS — E1169 Type 2 diabetes mellitus with other specified complication: Secondary | ICD-10-CM

## 2019-08-24 DIAGNOSIS — I10 Essential (primary) hypertension: Secondary | ICD-10-CM

## 2019-08-24 DIAGNOSIS — E782 Mixed hyperlipidemia: Secondary | ICD-10-CM

## 2019-08-24 DIAGNOSIS — R809 Proteinuria, unspecified: Secondary | ICD-10-CM

## 2019-08-24 LAB — MICROALBUMIN / CREATININE URINE RATIO
Creatinine, Urine: 94 mg/dL (ref 20–275)
Microalb Creat Ratio: 64 mcg/mg creat — ABNORMAL HIGH (ref <30)
Microalb, Ur: 6 mg/dL

## 2019-08-24 LAB — LIPID PANEL
Cholesterol: 214 mg/dL — ABNORMAL HIGH (ref <200)
HDL: 33 mg/dL — ABNORMAL LOW (ref >=50)
LDL Cholesterol (Calc): 135 mg/dL (calc) — ABNORMAL HIGH
Non-HDL Cholesterol (Calc): 181 mg/dL (calc) — ABNORMAL HIGH (ref <130)
Total CHOL/HDL Ratio: 6.5 (calc) — ABNORMAL HIGH (ref <5.0)
Triglycerides: 324 mg/dL — ABNORMAL HIGH (ref <150)

## 2019-08-24 LAB — TSH: TSH: 6.3 mIU/L — ABNORMAL HIGH

## 2019-08-24 MED ORDER — LISINOPRIL 5 MG PO TABS: 5.0000 mg | ORAL_TABLET | Freq: Every day | ORAL | 3 refills | Status: DC

## 2019-08-24 MED ORDER — ROSUVASTATIN CALCIUM 10 MG PO TABS: 10.0000 mg | ORAL_TABLET | Freq: Every day | ORAL | 3 refills | Status: AC

## 2019-08-31 ENCOUNTER — Encounter: Payer: Self-pay | Admitting: Family Medicine

## 2019-08-31 DIAGNOSIS — E038 Other specified hypothyroidism: Secondary | ICD-10-CM

## 2019-08-31 MED ORDER — LEVOTHYROXINE SODIUM 25 MCG PO TABS: ORAL_TABLET | ORAL | 1 refills | Status: DC

## 2019-09-06 ENCOUNTER — Encounter: Payer: PRIVATE HEALTH INSURANCE | Attending: Obstetrics and Gynecology | Admitting: Dietician

## 2019-09-06 ENCOUNTER — Encounter: Payer: Self-pay | Admitting: Dietician

## 2019-09-06 ENCOUNTER — Other Ambulatory Visit: Payer: Self-pay

## 2019-09-06 VITALS — Ht 62.0 in | Wt 334.8 lb

## 2019-09-06 DIAGNOSIS — Z713 Dietary counseling and surveillance: Secondary | ICD-10-CM | POA: Insufficient documentation

## 2019-09-06 DIAGNOSIS — E669 Obesity, unspecified: Secondary | ICD-10-CM | POA: Insufficient documentation

## 2019-09-06 DIAGNOSIS — Z6841 Body Mass Index (BMI) 40.0 and over, adult: Secondary | ICD-10-CM | POA: Insufficient documentation

## 2019-09-06 NOTE — Progress Notes (Signed)
Medical Nutrition Therapy: Visit start time: 0930  end time: 1000  Assessment:  Diagnosis: obesity Medical history changes: no changes Psychosocial issues/ stress concerns: patient reports some increased stress recently with concern over her sister's pregnancy. She reports feeling bored at home, unsure if she is feeling depressed.   Current weight: 334.8lbs  Height: 5'2" Medications, supplement changes: reconciled list in medical record  Progress and evaluation:  . Patient reports some increased stress, worried about sister's pregnancy. She feels she has eaten less during the day due to stress.  Marland Kitchen Has been napping during the day lately due to staying up late or waking up during the night. . She reports less money for food recently due to some additional expenses.    Physical activity: active all day yesterday -- fall cleaning in home  Dietary Intake:  Usual eating pattern includes 3 meals and 1 snacks per day. Dining out frequency: 0-1 meal per week.  Breakfast: Special K cereal or egg sandwich, or 2 slices toast with butter no jelly Snack: none Lunch: peeled sliced cucumber or ramen noodles Snack: none; tries to avoid, also not much in the home at the moment Supper: 11/22 beef stew; 11/21 spaghetti  Snack: milk, no bread Beverages: sprite zero, water sometimes with sugar free flavoring, milk, no juices  Nutrition Care Education: Topics covered:  Basic nutrition: appropriate nutrient balance   Weight control: reviewed progress since previous visit, reviewed role of exercise and role of adequate sleep, discussed working on options for improving sleep at night and increasing activity during the day.    Nutritional Diagnosis:  [place]-3.3 Overweight/obesity As related to history of excess calories and inactivity, hypothyroidism.  As evidenced by patient with current BMI of 61, working on diet and lifestyle changes to promote ongoing weight loss.  Intervention:  . Instruction and  discussion as noted above. Marland Kitchen Updated goals with input from patient.  . Patient would like to consult with [facility] counselor regarding her stress or possible depressed feelings. Will have counselor contact patient by phone.   Education Materials given:  Marland Kitchen Goals/ instructions   Learner/ who was taught:  . Patient    Level of understanding: Marland Kitchen Verbalizes/ demonstrates competency   Demonstrated degree of understanding via:   Teach back Learning barriers: . Cognitive limitations per problem list   Willingness to learn/ readiness for change: . Eager, change in progress  Monitoring and Evaluation:  Dietary intake, exercise, and body weight      follow up: 11/15/19 at 9:30am

## 2019-09-06 NOTE — Patient Instructions (Signed)
   Try keeping track of active time during the day; for example, write down what you do and how long it takes such as walking 10 minutes.   Increase activity gradually until you reach a goal of 30 minutes most days of the week.   Include some inexpensive protein food with meals, such as eggs, tuna, canned chicken.  Eat frozen or low-sodium canned vegetables. You can try Mrs. Dash seasoning on vegetables or other foods.

## 2019-09-15 ENCOUNTER — Telehealth: Payer: Self-pay | Admitting: Family Medicine

## 2019-09-15 NOTE — Telephone Encounter (Signed)
Leslie Allison calling from pharmacy called and stated that they are changing manufacture for medication levothyroxine (EUTHYROX) 25 MCG tablet [[id number]] and would like a call back from the nurse regarding changing RX. Please advise

## 2019-09-15 NOTE — Telephone Encounter (Signed)
Is this ok for manufacture change

## 2019-09-15 NOTE — Telephone Encounter (Signed)
She needs to come back in 6 weeks for repeat labs after starting new manufacturer pills.

## 2019-09-16 NOTE — Telephone Encounter (Signed)
Patient notified

## 2019-09-22 ENCOUNTER — Encounter: Payer: Self-pay | Admitting: Family Medicine

## 2019-09-22 ENCOUNTER — Other Ambulatory Visit: Payer: Self-pay | Admitting: Family Medicine

## 2019-09-22 ENCOUNTER — Other Ambulatory Visit: Payer: Self-pay | Admitting: Obstetrics and Gynecology

## 2019-09-22 DIAGNOSIS — Z6841 Body Mass Index (BMI) 40.0 and over, adult: Secondary | ICD-10-CM

## 2019-09-22 DIAGNOSIS — E038 Other specified hypothyroidism: Secondary | ICD-10-CM

## 2019-09-22 NOTE — Telephone Encounter (Signed)
Okay to refill? 

## 2019-10-24 ENCOUNTER — Emergency Department
Admission: EM | Admit: 2019-10-24 | Discharge: 2019-10-24 | Disposition: A | Discharge status: Home / Self Care | Payer: PRIVATE HEALTH INSURANCE | Attending: Emergency Medicine | Admitting: Emergency Medicine

## 2019-10-24 ENCOUNTER — Other Ambulatory Visit: Payer: Self-pay

## 2019-10-24 ENCOUNTER — Emergency Department: Payer: PRIVATE HEALTH INSURANCE

## 2019-10-24 ENCOUNTER — Encounter: Payer: Self-pay | Admitting: Emergency Medicine

## 2019-10-24 DIAGNOSIS — Z7984 Long term (current) use of oral hypoglycemic drugs: Secondary | ICD-10-CM | POA: Diagnosis not present

## 2019-10-24 DIAGNOSIS — I10 Essential (primary) hypertension: Secondary | ICD-10-CM | POA: Diagnosis not present

## 2019-10-24 DIAGNOSIS — U071 COVID-19: Secondary | ICD-10-CM | POA: Diagnosis not present

## 2019-10-24 DIAGNOSIS — E119 Type 2 diabetes mellitus without complications: Secondary | ICD-10-CM | POA: Diagnosis not present

## 2019-10-24 DIAGNOSIS — Z79899 Other long term (current) drug therapy: Secondary | ICD-10-CM | POA: Diagnosis not present

## 2019-10-24 DIAGNOSIS — E039 Hypothyroidism, unspecified: Secondary | ICD-10-CM | POA: Diagnosis not present

## 2019-10-24 DIAGNOSIS — R05 Cough: Secondary | ICD-10-CM | POA: Diagnosis present

## 2019-10-24 DIAGNOSIS — Z20822 Contact with and (suspected) exposure to covid-19: Secondary | ICD-10-CM

## 2019-10-24 LAB — SARS CORONAVIRUS 2 (TAT 6-24 HRS): SARS Coronavirus 2: POSITIVE — AB

## 2019-10-24 MED ORDER — BENZONATATE 200 MG PO CAPS: 200.0000 mg | ORAL_CAPSULE | Freq: Three times a day (TID) | ORAL | 0 refills | Status: DC | PRN

## 2019-10-24 MED ORDER — AZITHROMYCIN 250 MG PO TABS: ORAL_TABLET | ORAL | 0 refills | Status: DC

## 2019-10-24 MED ORDER — PREDNISONE 10 MG (21) PO TBPK: ORAL_TABLET | ORAL | 0 refills | Status: DC

## 2019-10-24 NOTE — ED Provider Notes (Signed)
[facility] Emergency Department Provider Note  ____________________________________________   First MD Initiated Contact with Patient 10/24/19 1509     (approximate)  I have reviewed the triage vital signs and the nursing notes.   HISTORY  Chief Complaint Cough    HPI Leslie Allison is a 24 y.o. female presents emergency department complaining of Covid-like symptoms.  Cough, sore throat and nasal congestion since Thursday.  No fever or chills.  Patient still has sense of taste and smell.  No vomiting or diarrhea.    Past Medical History:  Diagnosis Date  . Amenorrhea   . Asthma   . Diabetes mellitus without complication ([place])   . Hypothyroidism   . Thyroid disease     Patient Active Problem List   Diagnosis Date Noted  . Diabetes mellitus type 2 in obese ([place]) 05/25/2019  . Essential hypertension 05/25/2019  . Acquired hypothyroidism 05/25/2019  . Elevated transaminase level 05/25/2019  . Irregular uterine bleeding 12/21/2018  . Abnormal thyroid blood test 12/21/2018  . PCO (polycystic ovaries) 12/10/2016  . Vaginal discharge 11/21/2016  . Amenorrhea 07/18/2016  . Morbid obesity with BMI of 45.0-49.9, adult ([place]) 07/18/2016  . Mentally challenged 07/18/2016  . Family history of breast cancer in mother 07/18/2016    Past Surgical History:  Procedure Laterality Date  . NO PAST SURGERIES      Prior to Admission medications   Medication Sig Start Date End Date Taking? Authorizing Provider  albuterol (PROVENTIL HFA;VENTOLIN HFA) 108 (90 Base) MCG/ACT inhaler Inhale 2 puffs into the lungs every 6 (six) hours as needed for wheezing or shortness of breath. 10/25/18   Merlyn Lot, MD  blood glucose meter kit and supplies KIT Dispense based on patient and insurance preference. Use 1-2 times daily as needed. 05/25/19   Hubbard Hartshorn, FNP  EUTHYROX 25 MCG tablet TAKE 1 & 1/2 (ONE & ONE-HALF) TABLETS BY MOUTH ONCE DAILY BEFORE BREAKFAST (MUST   COME  FOR  LABS) 09/22/19   Hubbard Hartshorn, FNP  glucose blood test strip Use as instructed 06/16/19   Hubbard Hartshorn, FNP  labetalol (NORMODYNE) 200 MG tablet Take 1 tablet by mouth twice daily 09/29/19   Schuman, Christanna R, MD  lisinopril (ZESTRIL) 5 MG tablet Take 1 tablet (5 mg total) by mouth daily. 08/24/19   Hubbard Hartshorn, FNP  metFORMIN (GLUCOPHAGE) 500 MG tablet Take 1 tablet (500 mg total) by mouth 2 (two) times daily with a meal. 04/06/19   Hubbard Hartshorn, FNP  miconazole (MICOTIN) 2 % cream Apply 1 application topically 2 (two) times daily. 07/05/19   Delsa Grana, PA-C  norelgestromin-ethinyl estradiol Marilu Favre) 150-35 MCG/24HR transdermal patch Place 1 patch onto the skin once a week. 02/11/19   Schuman, Stefanie Libel, MD  rosuvastatin (CRESTOR) 10 MG tablet Take 1 tablet (10 mg total) by mouth daily. 08/24/19   Hubbard Hartshorn, FNP    Allergies Amoxicillin  Family History  Problem Relation Age of Onset  . Breast cancer Mother   . Ovarian cancer Neg Hx   . Colon cancer Neg Hx   . Diabetes Neg Hx   . Heart disease Neg Hx     Social History Social History   Tobacco Use  . Smoking status: Never Smoker  . Smokeless tobacco: Never Used  Substance Use Topics  . Alcohol use: Not Currently    Comment: occasional wine  . Drug use: No    Review of Systems  Constitutional: No fever/chills  Eyes: No visual changes. ENT: Positive sore throat. Respiratory: Positive cough Cardiovascular: Denies chest pain Gastrointestinal: Denies abdominal pain Genitourinary: Negative for dysuria. Musculoskeletal: Negative for back pain. Skin: Negative for rash. Psychiatric: no mood changes,     ____________________________________________   PHYSICAL EXAM:  VITAL SIGNS: ED Triage Vitals  Enc Vitals Group     BP 10/24/19 1441 137/82     Pulse Rate 10/24/19 1441 94     Resp 10/24/19 1441 16     Temp 10/24/19 1441 98.6 F (37 C)     Temp Source 10/24/19 1441 Oral     SpO2 10/24/19  1441 95 %     Weight 10/24/19 1439 (!) 323 lb (146.5 kg)     Height 10/24/19 1439 _0  (1.575 m)     Head Circumference --      Peak Flow --      Pain Score 10/24/19 1439 0     Pain Loc --      Pain Edu? --      Excl. in [place]? --     Constitutional: Alert and oriented. Well appearing and in no acute distress. Eyes: Conjunctivae are normal.  Head: Atraumatic. Nose: No congestion/rhinnorhea. Mouth/Throat: Mucous membranes are moist.  Throat appears normal Neck:  supple no lymphadenopathy noted Cardiovascular: Normal rate, regular rhythm. Heart sounds are normal Respiratory: Normal respiratory effort.  No retractions, lungs c t a  GU: deferred Musculoskeletal: FROM all extremities, warm and well perfused Neurologic:  Normal speech and language.  Skin:  Skin is warm, dry and intact. No rash noted. Psychiatric: Mood and affect are normal. Speech and behavior are normal.  ____________________________________________   LABS (all labs ordered are listed, but only abnormal results are displayed)  Labs Reviewed  SARS CORONAVIRUS 2 (TAT 6-24 HRS)   ____________________________________________   ____________________________________________  RADIOLOGY  Chest x-ray is normal  ____________________________________________   PROCEDURES  Procedure(s) performed: No  Procedures    ____________________________________________   INITIAL IMPRESSION / ASSESSMENT AND PLAN / ED COURSE  Pertinent labs & imaging results that were available during my care of the patient were reviewed by me and considered in my medical decision making (see chart for details).   Patient is a 24 year old female presents emergency department complaining of Covid-like symptoms.  See HPI  Physical exam is basically unremarkable, vitals are normal  Chest x-ray is normal  Covid test 6 to 24-hour performed.  Explained findings to the patient.  She was given a prescription for the Z-Pak, Tessalon Perles.   She is to follow-up with regular doctor if not improving in 3 days.  If her Covid test is positive she should remain quarantined for an additional 10 days.    Leslie Allison was evaluated in Emergency Department on 10/24/2019 for the symptoms described in the history of present illness. She was evaluated in the context of the global COVID-19 pandemic, which necessitated consideration that the patient might be at risk for infection with the SARS-CoV-2 virus that causes COVID-19. Institutional protocols and algorithms that pertain to the evaluation of patients at risk for COVID-19 are in a state of rapid change based on information released by regulatory bodies including the CDC and federal and state organizations. These policies and algorithms were followed during the patient's care in the ED.   As part of my medical decision making, I reviewed the following data within the [place] notes reviewed and incorporated, Old chart reviewed, Radiograph reviewed chest x-ray normal, Notes from  prior ED visits and [place] Controlled Substance Database  ____________________________________________   FINAL CLINICAL IMPRESSION(S) / ED DIAGNOSES  Final diagnoses:  Suspected COVID-19 virus infection      NEW MEDICATIONS STARTED DURING THIS VISIT:  New Prescriptions   No medications on file     Note:  This document was prepared using Dragon voice recognition software and may include unintentional dictation errors.    Versie Starks, PA-C 10/24/19 1545    Lavonia Drafts, MD 10/24/19 1757

## 2019-10-24 NOTE — ED Triage Notes (Signed)
Pt to ED via POV for cough and nasal congestion. Pt states that she has had cough since Thursday. Pt reports that she has coughed so much it makes her feel sick and makes her hurt. Pt is in NAD at this time.

## 2019-10-24 NOTE — Discharge Instructions (Signed)
Follow-up with your regular doctor if not improving in 3 days.  Return emergency department worsening.  You should remain quarantined until you receive your Covid test results.  If negative may return to work next day.  If positive you should remain quarantined for an additional 10 days.

## 2019-10-25 ENCOUNTER — Telehealth: Payer: Self-pay

## 2019-10-25 NOTE — Telephone Encounter (Signed)
Notified patient of positive SARS Coronavirus 2 test result. Reviewed quarantine and precautions patients verbalized understanding.

## 2019-11-04 ENCOUNTER — Ambulatory Visit: Payer: PRIVATE HEALTH INSURANCE | Attending: Internal Medicine

## 2019-11-04 DIAGNOSIS — Z20822 Contact with and (suspected) exposure to covid-19: Secondary | ICD-10-CM

## 2019-11-05 LAB — NOVEL CORONAVIRUS, NAA: SARS-CoV-2, NAA: NOT DETECTED

## 2019-11-15 ENCOUNTER — Encounter: Payer: Self-pay | Admitting: Dietician

## 2019-11-15 ENCOUNTER — Encounter: Payer: PRIVATE HEALTH INSURANCE | Attending: Obstetrics and Gynecology | Admitting: Dietician

## 2019-11-15 ENCOUNTER — Other Ambulatory Visit: Payer: Self-pay

## 2019-11-15 VITALS — Ht 62.0 in | Wt 330.9 lb

## 2019-11-15 DIAGNOSIS — E669 Obesity, unspecified: Secondary | ICD-10-CM | POA: Insufficient documentation

## 2019-11-15 DIAGNOSIS — Z713 Dietary counseling and surveillance: Secondary | ICD-10-CM | POA: Insufficient documentation

## 2019-11-15 DIAGNOSIS — Z6841 Body Mass Index (BMI) 40.0 and over, adult: Secondary | ICD-10-CM | POA: Diagnosis not present

## 2019-11-15 NOTE — Progress Notes (Signed)
Medical Nutrition Therapy: Visit start time: 0930  end time: 1000  Assessment:  Diagnosis: obesity Medical history changes: COVID 10/2019 (now negative), pneumonia 10/2019 Psychosocial issues/ stress concerns: patient reports high stress level  Current weight: 330.9lbs Height: 5'2" Medications, supplement changes: no changes per patient  Progress and evaluation:  . Patient reports high stress level, recently moved homes.  She has also started new job at [employer], some days, some nights until 10 or 11pm.  . She had positive COVID test 3 days after starting new job, was home 2 weeks so just starting back after negative test. . Sister just delivered her baby, so that previous stress has improved.  . Eating pattern has become more erratic due to recent move and new job; illness. She is snacking during breaks at work rather than eating balanced meals. Has stopped eating breakfast due to rush in getting to work.    Physical activity: increased daily activity due to job. Reports some foot pain, looking to buy appropriate shoes  Dietary Intake:  Usual eating pattern includes 1-2 meals and 1-2 snacks per day. Dining out frequency: not assessed today.  Breakfast: usually  none Snack: cheese flavored crackers; banana chips Lunch: often none when working; snack foods or quick meal when home, or none Snack: same as am Supper: veggie tacos 11/31; spaghetti;  Snack: none when off; when at work, same as am; cheddar peppers or mozz sticks from work Beverages: Tour manager drink orange mango; gatorade; strawberry lemonade; occ milk  Nutrition Care Education: Topics covered:  Basic nutrition: reviewed importance of eating at regular intervals;  and inclusion of vegetables and fruits along with protein sources.  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    Nutritional Diagnosis:  [place]-3.3 Overweight/obesity As related to excess calories and inadequate physical activity, hypothyroidism.  As evidenced by patient with current BMI of 60.5, following low-calorie eating pattern for ongoing gradual weight loss.  Intervention:  . Instruction and discussion as noted above. . Weight loss has continued at a gradual rate since 12/2018, when her weight was 351lbs.  . Established goals for resuming more structured eating pattern, and improved nutritional intake and balance after some recent life changes and events.   . No further follow-up scheduled due to expiration of current referral; patient will work on goals and call with any questions or concerns.   Education Materials given:  Marland Kitchen Goals/ instructions   Learner/ who was taught:  . Patient  . Spouse/ partner  Level of understanding: Marland Kitchen Verbalizes/ demonstrates competency   Demonstrated degree of understanding via:   Teach back Learning barriers: . None  Willingness to learn/ readiness for change: . Eager, change in progress  Monitoring and Evaluation:  Dietary intake, exercise, and body weight      follow up: prn

## 2019-11-15 NOTE — Patient Instructions (Addendum)
   Try having a protein drink like Premier Protein, Fairlife protein, or muscle milk along with crackers or banana chips for a snack. This is a better replacement for a meal than just crackers or chips.   Can also have a cup of yogurt and some fruit and/or granola; or cheese or [place] sandwich.   Drink sprite zero or coke zero at work, and limit or avoid other drinks due to high sugar and calories. Think about bringing your own sugar free flavoring packet to add to water when at work.   Make sure to eat a vegetable with dinner, and any other meal you can -- salad, slice cucumber, baby carrots, cooked green beans, etc.

## 2019-11-22 ENCOUNTER — Other Ambulatory Visit: Payer: Self-pay

## 2019-11-22 ENCOUNTER — Ambulatory Visit: Payer: PRIVATE HEALTH INSURANCE | Admitting: Family Medicine

## 2019-11-22 ENCOUNTER — Ambulatory Visit: Payer: PRIVATE HEALTH INSURANCE | Admitting: Obstetrics and Gynecology

## 2019-11-22 DIAGNOSIS — Z91199 Patient's noncompliance with other medical treatment and regimen due to unspecified reason: Secondary | ICD-10-CM

## 2019-11-22 DIAGNOSIS — Z5329 Procedure and treatment not carried out because of patient's decision for other reasons: Secondary | ICD-10-CM

## 2019-11-22 NOTE — Progress Notes (Signed)
No Show

## 2019-11-25 ENCOUNTER — Ambulatory Visit: Payer: PRIVATE HEALTH INSURANCE | Admitting: Obstetrics and Gynecology

## 2019-12-03 ENCOUNTER — Ambulatory Visit: Payer: PRIVATE HEALTH INSURANCE | Admitting: Obstetrics and Gynecology

## 2019-12-14 ENCOUNTER — Other Ambulatory Visit: Payer: Self-pay

## 2019-12-14 ENCOUNTER — Other Ambulatory Visit (HOSPITAL_COMMUNITY)
Admission: RE | Admit: 2019-12-14 | Discharge: 2019-12-14 | Disposition: A | Discharge status: Home / Self Care | Payer: PRIVATE HEALTH INSURANCE | Source: Ambulatory Visit | Attending: Obstetrics and Gynecology | Admitting: Obstetrics and Gynecology

## 2019-12-14 ENCOUNTER — Encounter: Payer: Self-pay | Admitting: Obstetrics and Gynecology

## 2019-12-14 ENCOUNTER — Ambulatory Visit (INDEPENDENT_AMBULATORY_CARE_PROVIDER_SITE_OTHER): Payer: PRIVATE HEALTH INSURANCE | Admitting: Obstetrics and Gynecology

## 2019-12-14 VITALS — Ht 62.0 in | Wt 332.0 lb

## 2019-12-14 DIAGNOSIS — E669 Obesity, unspecified: Secondary | ICD-10-CM | POA: Diagnosis not present

## 2019-12-14 DIAGNOSIS — N912 Amenorrhea, unspecified: Secondary | ICD-10-CM

## 2019-12-14 DIAGNOSIS — Z113 Encounter for screening for infections with a predominantly sexual mode of transmission: Secondary | ICD-10-CM | POA: Insufficient documentation

## 2019-12-14 DIAGNOSIS — Z6841 Body Mass Index (BMI) 40.0 and over, adult: Secondary | ICD-10-CM

## 2019-12-14 NOTE — Progress Notes (Signed)
Patient ID: Leslie Allison, female   DOB: Dec 23, 1995, 24 y.o.   MRN: [id number]  Reason for Consult: Follow-up   Referred by Hubbard Hartshorn, FNP  Subjective:     HPI:  Leslie Allison is a 24 y.o. female   [name] presents today with complaints of amenorrhea.  She reports that her last period was in December 2020.  She and her husband strongly desire pregnancy and to have family.  She has a history of PCOS and irregular ovulation.  She had been using Xulane for birth control however she discontinued this medicine at some point in November or December.  She thinks that she may be pregnant.  Her home pregnancy tests have shown faint lines.  Her mother-in-law told her to add bleach to her urine and if it bubbles that she is pregnant.  She reports that she did this in her urine bubbled.  She reported that she took a specimen of her husband's urine and added bleach to it and it did not bubble.     Past Medical History:  Diagnosis Date  . Amenorrhea   . Asthma   . Diabetes mellitus without complication ([place])   . Hypothyroidism   . Thyroid disease    Family History  Problem Relation Age of Onset  . Breast cancer Mother   . Ovarian cancer Neg Hx   . Colon cancer Neg Hx   . Diabetes Neg Hx   . Heart disease Neg Hx    Past Surgical History:  Procedure Laterality Date  . NO PAST SURGERIES      Short Social History:  Social History   Tobacco Use  . Smoking status: Never Smoker  . Smokeless tobacco: Never Used  Substance Use Topics  . Alcohol use: Not Currently    Comment: occasional wine    Allergies  Allergen Reactions  . Amoxicillin     Has patient had a PCN reaction causing immediate rash, facial/tongue/throat swelling, SOB or lightheadedness with hypotension: No Has patient had a PCN reaction causing severe rash involving mucus membranes or skin necrosis: No Has patient had a PCN reaction that required hospitalization: No Has patient had a PCN reaction occurring within the  last 10 years: No If all of the above answers are "NO", then may proceed with Cephalosporin use.      Current Outpatient Medications  Medication Sig Dispense Refill  . albuterol (PROVENTIL HFA;VENTOLIN HFA) 108 (90 Base) MCG/ACT inhaler Inhale 2 puffs into the lungs every 6 (six) hours as needed for wheezing or shortness of breath. 1 Inhaler 2  . azithromycin (ZITHROMAX Z-PAK) 250 MG tablet 2 pills today then 1 pill a day for 4 days 6 each 0  . benzonatate (TESSALON) 200 MG capsule Take 1 capsule (200 mg total) by mouth 3 (three) times daily as needed for cough. 30 capsule 0  . blood glucose meter kit and supplies KIT Dispense based on patient and insurance preference. Use 1-2 times daily as needed. 1 each 0  . EUTHYROX 25 MCG tablet TAKE 1 & 1/2 (ONE & ONE-HALF) TABLETS BY MOUTH ONCE DAILY BEFORE BREAKFAST (MUST  COME  FOR  LABS) 7 tablet 0  . glucose blood test strip Use as instructed 100 each 12  . labetalol (NORMODYNE) 200 MG tablet Take 1 tablet by mouth twice daily 60 tablet 0  . lisinopril (ZESTRIL) 5 MG tablet Take 1 tablet (5 mg total) by mouth daily. 90 tablet 3  . metFORMIN (GLUCOPHAGE) 500 MG tablet Take  1 tablet (500 mg total) by mouth 2 (two) times daily with a meal. 180 tablet 1  . miconazole (MICOTIN) 2 % cream Apply 1 application topically 2 (two) times daily. 28.35 g 0  . norelgestromin-ethinyl estradiol Marilu Favre) 150-35 MCG/24HR transdermal patch Place 1 patch onto the skin once a week. 3 patch 12  . predniSONE (STERAPRED UNI-PAK 21 TAB) 10 MG (21) TBPK tablet Take 6 pills on day one then decrease by 1 pill each day 21 tablet 0  . rosuvastatin (CRESTOR) 10 MG tablet Take 1 tablet (10 mg total) by mouth daily. 90 tablet 3   No current facility-administered medications for this visit.    Review of Systems  Constitutional: Negative for chills, fatigue, fever and unexpected weight change.  HENT: Negative for trouble swallowing.  Eyes: Negative for loss of vision.    Respiratory: Negative for cough, shortness of breath and wheezing.  Cardiovascular: Negative for chest pain, leg swelling, palpitations and syncope.  GI: Negative for abdominal pain, blood in stool, diarrhea, nausea and vomiting.  GU: Negative for difficulty urinating, dysuria, frequency and hematuria.  Musculoskeletal: Negative for back pain, leg pain and joint pain.  Skin: Negative for rash.  Neurological: Negative for dizziness, headaches, light-headedness, numbness and seizures.  Psychiatric: Negative for behavioral problem, confusion, depressed mood and sleep disturbance.        Objective:  Objective   Vitals:   12/14/19 1351  Weight: (!) 332 lb (150.6 kg)  Height: 5' 2"  (1.575 m)   Body mass index is 60.72 kg/m.  Physical Exam Vitals and nursing note reviewed.  Constitutional:      Appearance: She is well-developed.  HENT:     Head: Normocephalic and atraumatic.  Eyes:     Pupils: Pupils are equal, round, and reactive to light.  Cardiovascular:     Rate and Rhythm: Normal rate and regular rhythm.  Pulmonary:     Effort: Pulmonary effort is normal. No respiratory distress.  Skin:    General: Skin is warm and dry.  Neurological:     Mental Status: She is alert and oriented to person, place, and time.  Psychiatric:        Behavior: Behavior normal.        Thought Content: Thought content normal.        Judgment: Judgment normal.         Assessment/Plan:     24 year old with complaints of amenorrhea desires pregnancy testing. Office urine pregnancy test is negative will obtain beta-hCG testing at the patient's request. Encourage the patient to actively pursue weight loss.  Discussed that weight loss will help her have a safer pregnancy as well as increase her chances of getting pregnant.  Weight loss also decreases her risk of miscarriage as well as neural tube defects with infant.  Given patient's multiple medical comorbidities she is not a good candidate at  this time for ovulation induction.  Encouraged her to try to obtain a goal weight of 280 pounds per before pursuing pregnancy.  Recommend taking a daily prenatal vitamin since she is pursuing pregnancy.  Concern for return of abnormal uterine bleeding given her prior problems and noncompliance with birth control pills.   More than 15 minutes were spent face to face with the patient in the room with more than 50% of the time spent providing counseling and discussing the plan of management.    Adrian Prows MD [facility], [facility] 12/14/2019 5:52 PM

## 2019-12-15 LAB — BETA HCG QUANT (REF LAB): hCG Quant: <1 m[IU]/mL

## 2019-12-15 NOTE — Progress Notes (Signed)
Please call patient with negative pregnancy test

## 2019-12-15 NOTE — Telephone Encounter (Signed)
Yes, please schedule with them

## 2019-12-16 LAB — URINE CYTOLOGY ANCILLARY ONLY
Chlamydia: NEGATIVE
Comment: NEGATIVE
Comment: NORMAL
Neisseria Gonorrhea: NEGATIVE

## 2019-12-18 ENCOUNTER — Encounter: Payer: Self-pay | Admitting: Family Medicine

## 2019-12-30 ENCOUNTER — Encounter: Payer: Self-pay | Admitting: Family Medicine

## 2020-01-03 NOTE — Progress Notes (Signed)
Patient ID: Leslie Allison, female    DOB: [birth year], 24 y.o.   MRN: [id number]  PCP: Hubbard Hartshorn, FNP  Chief Complaint  Patient presents with  . Follow-up  . Diabetes  . Hyperlipidemia  . Depression  . Hypothyroidism    Subjective:   Leslie Allison is a 24 y.o. female, presents to clinic with CC of the following:  Chief Complaint  Patient presents with  . Follow-up  . Diabetes  . Hyperlipidemia  . Depression  . Hypothyroidism    HPI: Husband with her this morning Patient is a 24 y.o. female patient of Raelyn Ensign, last visit with Raquel Sarna in November, 2020 and noted a no show for f/u in early Feb 2021. Presents today for follow-up. Needs medication refill and labs.  Of note, she did see Ob/gyn on December 14, 2019 with this assessment:  24 year old with complaints of amenorrhea desires pregnancy testing.  Office urine pregnancy test negative, obtained beta-hCG testing at the patient's request.  Encourage the patient to actively pursue weight loss.  Discussed that weight loss will help her have a safer pregnancy as well as increase her chances of getting pregnant.  Weight loss also decreases her risk of miscarriage as well as neural tube defects with infant.  Given patient's multiple medical comorbidities she is not a good candidate at this time for ovulation induction.  Encouraged her to try to obtain a goal weight of 280 pounds per before pursuing pregnancy.  Recommend taking a daily prenatal vitamin since she is pursuing pregnancy.  Concern for return of abnormal uterine bleeding given her prior problems and noncompliance with birth control pills.   Issues followed noted in November:  Diabetes mellitustype 2 Checking sugars?Yes - checking periodically, about once a week, 140's She had CCM team involved for education, but stopped following up with them. Diet - Has not been doing well with her diet , noted trying to watch Exercise: Not exercising currently, yoga  Last  eye exam:She had eye exam in early 2020 at [facility] Denies: polydipsia, polyphagia, polyuria, vision changes, or neuropathy. Most recent A1C: Lab Results  Component Value Date   HGBA1C 6.4 (H) 07/05/2019   Lab Results  Component Value Date   MICROALBUR 6.0 08/23/2019   LDLCALC 135 (H) 08/23/2019   CREATININE 0.77 07/05/2019  Microalb/cr ratio - 64 (increased)  Urine Micro UTD - yes, was + last visit and lisinopril added in Nov Diabetic Medications:Metformin 552m BID, sometimes forgets ACE/ARB - as above, low dose lisinopril added last visit  Statin:Crestor added in Nov with hyperlipidemia noted on check  Hyperlipidemia - statin prescribed, missed some doses at times Lab Results  Component Value Date   CHOL 214 (H) 08/23/2019   HDL 33 (L) 08/23/2019   LDLCALC 135 (H) 08/23/2019   TRIG 324 (H) 08/23/2019   CHOLHDL 6.5 (H) 08/23/2019    Obesity: She states her heaviest weight was over 400lbs.  She is not exercising and is not following her diabetic diet.  Wt Readings from Last 3 Encounters:  01/04/20 (!) 335 lb 12.8 oz (152.3 kg)  12/14/19 (!) 332 lb (150.6 kg)  11/15/19 (!) 330 lb 14.4 oz (150.1 kg)   On last note with nutrition in very early February, patient reported high stress level, recently moved homes in past, still high with sister just had baby.  No longer working. COVID test 3 days after starting new job, was home 2 weeks.   Hypothyroidism: History:hypothyroidism Current Medication Regimen:37.529m synthroid -  she reported no compliance with medicines for a couple months, not able to get refills.  Medication not changed last visit due to compliance concern.  Lab Results  Component Value Date   TSH 6.30 (H) 08/23/2019   Current Symptoms:denies sx's of concern  HTN:She has been taking labetalol that was rx'd by GYN, did discussed possibly changing her to ACEI or ARB pending urine results, and low dose ACE added in addition last visit Not check  BP at home -current regimen includes Labetalol 223m BID with no missed doses in a week and lisinopril- 526mdaily. States not missing doses.  -denies CP, SOB, swelling of ankles, severe HA's, vision changes - ptdoes notfollow a low sodium diet; DASH diet discussed and recommended prior   Allergic rhinitis/asthma Needs albuterol refill for her asthma, worsens this time of year, occas  Wheezy, no marked SOB. Tries tylenol.  Not tolerate nasal sprays, irritates nose  Tob - never smoker Alcohol - occas wine noted  Check A1C, ? TSH if taking med regularly, ? Lipid now on crestor, BMP (vs CMP to include liver tests now on crestor)  Patient Active Problem List   Diagnosis Date Noted  . Microalbuminuria due to type 2 diabetes mellitus ([facility]/23/2021  . Diabetes mellitus type 2 in obese ([facility]08/08/2019  . Essential hypertension 05/25/2019  . Acquired hypothyroidism 05/25/2019  . Elevated transaminase level 05/25/2019  . Irregular uterine bleeding 12/21/2018  . Abnormal thyroid blood test 12/21/2018  . PCO (polycystic ovaries) 12/10/2016  . Vaginal discharge 11/21/2016  . Amenorrhea 07/18/2016  . Morbid obesity with BMI of 60.0-69.9, adult ([facility]10/02/2016  . Mentally challenged 07/18/2016  . Family history of breast cancer in mother 07/18/2016      Current Outpatient Medications:  .  blood glucose meter kit and supplies KIT, Dispense based on patient and insurance preference. Use 1-2 times daily as needed., Disp: 1 each, Rfl: 0 .  glucose blood test strip, Use as instructed, Disp: 100 each, Rfl: 12 .  labetalol (NORMODYNE) 200 MG tablet, Take 1 tablet by mouth twice daily, Disp: 60 tablet, Rfl: 0 .  lisinopril (ZESTRIL) 5 MG tablet, Take 1 tablet (5 mg total) by mouth daily., Disp: 90 tablet, Rfl: 3 .  metFORMIN (GLUCOPHAGE) 500 MG tablet, Take 1 tablet (500 mg total) by mouth 2 (two) times daily with a meal., Disp: 180 tablet, Rfl: 1 .  predniSONE (STERAPRED UNI-PAK 21 TAB) 10 MG  (21) TBPK tablet, Take 6 pills on day one then decrease by 1 pill each day, Disp: 21 tablet, Rfl: 0 .  rosuvastatin (CRESTOR) 10 MG tablet, Take 1 tablet (10 mg total) by mouth daily., Disp: 90 tablet, Rfl: 3 .  albuterol (PROVENTIL HFA;VENTOLIN HFA) 108 (90 Base) MCG/ACT inhaler, Inhale 2 puffs into the lungs every 6 (six) hours as needed for wheezing or shortness of breath. (Patient not taking: Reported on 01/04/2020), Disp: 1 Inhaler, Rfl: 2 .  EUTHYROX 25 MCG tablet, TAKE 1 & 1/2 (ONE & ONE-HALF) TABLETS BY MOUTH ONCE DAILY BEFORE BREAKFAST (MUST  COME  FOR  LABS) (Patient not taking: Reported on 01/04/2020), Disp: 7 tablet, Rfl: 0 .  norelgestromin-ethinyl estradiol (XULANE) 150-35 MCG/24HR transdermal patch, Place 1 patch onto the skin once a week. (Patient not taking: Reported on 01/04/2020), Disp: 3 patch, Rfl: 12   Allergies  Allergen Reactions  . Amoxicillin     Has patient had a PCN reaction causing immediate rash, facial/tongue/throat swelling, SOB or lightheadedness with hypotension: No Has  patient had a PCN reaction causing severe rash involving mucus membranes or skin necrosis: No Has patient had a PCN reaction that required hospitalization: No Has patient had a PCN reaction occurring within the last 10 years: No If all of the above answers are "NO", then may proceed with Cephalosporin use.       Past Surgical History:  Procedure Laterality Date  . NO PAST SURGERIES       Family History  Problem Relation Age of Onset  . Breast cancer Mother   . Ovarian cancer Neg Hx   . Colon cancer Neg Hx   . Diabetes Neg Hx   . Heart disease Neg Hx      Social History   Tobacco Use  . Smoking status: Never Smoker  . Smokeless tobacco: Never Used  Substance Use Topics  . Alcohol use: Not Currently    Comment: occasional wine    With staff assistance, above reviewed with the patient today.  ROS: As per HPI, otherwise no specific complaints on a limited and focused system  review   No results found for this or any previous visit (from the past 72 hour(s)).   PHQ2/9: Depression screen [facility] 2/9 01/04/2020 11/15/2019 08/23/2019 07/05/2019 05/25/2019  Decreased Interest 0 0 0 0 0  Down, Depressed, Hopeless 3 1 0 0 0  PHQ - 2 Score 3 1 0 0 0  Altered sleeping 0 - 0 0 0  Tired, decreased energy 0 - 0 0 0  Change in appetite 0 - 0 0 0  Feeling bad or failure about yourself  1 - 0 0 0  Trouble concentrating 0 - 0 0 0  Moving slowly or fidgety/restless 0 - 0 0 0  Suicidal thoughts 0 - 0 0 0  PHQ-9 Score 4 - 0 0 0  Difficult doing work/chores Somewhat difficult - Not difficult at all Not difficult at all Not difficult at all   PHQ-2/9 Result reviewed, more depressed,  Has days when want to just give up, feels down, gets frustrated, but no thoughts of hurting self or others. Never went to counseling,    Fall Risk: Fall Risk  01/04/2020 11/15/2019 09/06/2019 08/23/2019 07/05/2019  Falls in the past year? 0 0 0 0 0  Number falls in past yr: 0 - - 0 0  Injury with Fall? 0 - - 0 0  Follow up - - - Falls evaluation completed -      Objective:   Vitals:   01/04/20 0745  Pulse: (!) 109  Resp: 20  Temp: (!) 97.1 F (36.2 C)  TempSrc: Temporal  SpO2: 98%  Weight: (!) 335 lb 12.8 oz (152.3 kg)  Height: 5' 2" (1.575 m)    Body mass index is 61.42 kg/m.  Physical Exam   NAD, masked, obese HEENT - [place]/AT, sclera anicteric, PERRL, EOMI, conj - non-inj'ed, + tongue piercing, pharynx clear Neck - supple, no adenopathy, no obvious TM, carotids 2+ and = without bruits bilat Car - RRR without m/g/r Pulm- RR and effort normal at rest, CTA without wheeze or rales Abd - soft, obese, NT,  Back - no CVA tenderness Ext - no LE edema, no active joints Neuro/psychiatric - affect was not flat, appropriate with conversation  Alert and oriented  Grossly non-focal -sensation intact to light touch in the distal extremities  Speech normal, not rapid A1C - 6.9 today  Results  for orders placed or performed in visit on 12/14/19  Beta hCG quant (ref lab)  Result Value Ref Range   hCG Quant <1 mIU/mL  Urine cytology ancillary only  Result Value Ref Range   Neisseria Gonorrhea Negative    Chlamydia Negative    Comment Normal Reference [place] Chlamydia - Negative    Comment      Normal Reference Range Neisseria Gonorrhea - Negative       Assessment & Plan:  1. Diabetes mellitus type 2 in obese (HCC) A1C - 6.9 She notes she takes the Metformin fairly regularly, although missed doses.  Emphasized the importance of taking this regularly. Also emphasized the importance of dietary modifications, trying to do her best to avoid high carbs, sugary drinks, and she notes she has been trying to do that to some extent.  She has had nutritionist involved in the fairly recent past. Would not change any dose today of her medication. Hyperlipidemia -emphasized the importance of continuing to take the Crestor regularly as well, also diet modifications and increasing activity as she is trying to do will be helpful. We will check labs again on her follow-up visit.  2. Essential hypertension Her blood pressure today was borderline high, higher than we like in someone with diabetes.  Educated her on our goals with blood pressure, closer to 130/85. We will increase her lisinopril to 10 mg daily presently. He does not have the ability to check at home presently, and await follow-up rechecks.  3. Microalbuminuria due to type 2 diabetes mellitus ([place]) He is on an ACE inhibitor and will continue. Continue to monitor.  4. Morbid obesity with BMI of 60.0-69.9, adult ([place]) She is working towards more weight loss, in hopes of getting pregnant.  She did just see OB/GYN in early March as well. Discussed the importance of her thyroid status with respect to weight gain, and really needs to get back on the thyroid supplement daily and take regularly.  That was refilled today, and continued the  dose she was taking prior 37.5 mcg, and noted it usually takes at least 8-12 weeks to get a feel of her thyroid status on this current dose.  5. Reactive depression Discussed the potential for counseling, and her husband in the room seem to downplay the counseling approach.  I did note that counseling may be helpful, and often is helpful in combination with a medication if needed.  Given her desire to get pregnant here presently, hesitant to add medications at this point, as which she. It is hoped she will pursue some form of counseling, although not optimistic given the interactions with the spouse noted in the office today. Do feel her depression is more reactive, with frustrations trying to get pregnant in addition to her difficulty losing weight as major contributors.  No concerns for hurting self or others.  6. Other specified hypothyroidism She has not been taking the medicine for at least a couple months now.  Given her last TSH, do feel she needs to resume the medication, and take regularly.  Will await to check her labs after she is on this dose regularly for the next 2 to 3 months, and refill that medicine for her today. - levothyroxine (EUTHYROX) 25 MCG tablet; TAKE 1 & 1/2 (ONE & ONE-HALF) TABLETS BY MOUTH ONCE DAILY BEFORE BREAKFAST (MUST  COME  FOR  LABS)  Dispense: 7 tablet; Refill: 0  7. Seasonal allergic rhinitis due to pollen She notes this year she gets a little more wheezy at times, and uses an albuterol inhaler as needed.  She does not  have one at home to use at this point.  Denies marked symptoms of shortness of breath or cough.  Also has some mild runny nose at times. Recommended she try a nonsedating antihistamine, like a generic Claritin or Allegra type product which are over-the-counter to take once daily as needed, as will be more helpful than Tylenol she is trying at times presently Also refilled the albuterol inhaler, use as needed and if using more frequently and with any  regularity, she is to follow-up.  8. Mild intermittent extrinsic asthma without complication As above  We will follow-up in 3 months time at the latest, sooner as needed, and labs to be checked at that time as well.       Towanda Malkin, MD 01/04/20 8:00 AM

## 2020-01-04 ENCOUNTER — Ambulatory Visit: Payer: Self-pay | Admitting: Internal Medicine

## 2020-01-04 ENCOUNTER — Encounter: Payer: Self-pay | Admitting: Internal Medicine

## 2020-01-04 ENCOUNTER — Other Ambulatory Visit: Payer: Self-pay

## 2020-01-04 VITALS — BP 140/90 | HR 109 | Temp 97.1°F | Resp 20 | Ht 62.0 in | Wt 335.8 lb

## 2020-01-04 DIAGNOSIS — Z30016 Encounter for initial prescription of transdermal patch hormonal contraceptive device: Secondary | ICD-10-CM

## 2020-01-04 DIAGNOSIS — Z6841 Body Mass Index (BMI) 40.0 and over, adult: Secondary | ICD-10-CM

## 2020-01-04 DIAGNOSIS — R809 Proteinuria, unspecified: Secondary | ICD-10-CM

## 2020-01-04 DIAGNOSIS — E1169 Type 2 diabetes mellitus with other specified complication: Secondary | ICD-10-CM

## 2020-01-04 DIAGNOSIS — I10 Essential (primary) hypertension: Secondary | ICD-10-CM

## 2020-01-04 DIAGNOSIS — E785 Hyperlipidemia, unspecified: Secondary | ICD-10-CM

## 2020-01-04 DIAGNOSIS — J301 Allergic rhinitis due to pollen: Secondary | ICD-10-CM

## 2020-01-04 DIAGNOSIS — J4520 Mild intermittent asthma, uncomplicated: Secondary | ICD-10-CM

## 2020-01-04 DIAGNOSIS — E038 Other specified hypothyroidism: Secondary | ICD-10-CM

## 2020-01-04 DIAGNOSIS — E669 Obesity, unspecified: Secondary | ICD-10-CM

## 2020-01-04 DIAGNOSIS — E1129 Type 2 diabetes mellitus with other diabetic kidney complication: Secondary | ICD-10-CM | POA: Insufficient documentation

## 2020-01-04 DIAGNOSIS — F329 Major depressive disorder, single episode, unspecified: Secondary | ICD-10-CM

## 2020-01-04 DIAGNOSIS — J45909 Unspecified asthma, uncomplicated: Secondary | ICD-10-CM | POA: Insufficient documentation

## 2020-01-04 MED ORDER — ALBUTEROL SULFATE HFA 108 (90 BASE) MCG/ACT IN AERS: 2.0000 | INHALATION_SPRAY | Freq: Four times a day (QID) | RESPIRATORY_TRACT | 1 refills | Status: AC | PRN

## 2020-01-04 MED ORDER — LEVOTHYROXINE SODIUM 25 MCG PO TABS: ORAL_TABLET | ORAL | 1 refills | Status: AC

## 2020-01-04 MED ORDER — LISINOPRIL 10 MG PO TABS: 10.0000 mg | ORAL_TABLET | Freq: Every day | ORAL | 3 refills | Status: AC

## 2020-01-04 NOTE — Patient Instructions (Signed)
Will increase lisinopril medicine to 10 mg daily.  You can take two 5 mg tablets until you run out, then the prescription at the pharmacy will be for 10 mg tablets, and you can take 1 daily after.  Please pick up the thyroid supplement medicine at the pharmacy to take daily as well.  Also, a medicine like a generic Claritin or Allegra type product once daily can be helpful with change of seasons allergy symptoms, and is much better than any Tylenol product to help.  I also refilled the albuterol inhaler to have as needed, and if your symptoms are increasing and the inhaler is needed on any kind of regular basis, follow-up is recommended.

## 2020-01-23 ENCOUNTER — Encounter: Payer: Self-pay | Admitting: Family Medicine

## 2020-01-26 NOTE — Progress Notes (Signed)
Patient

## 2020-01-27 ENCOUNTER — Ambulatory Visit: Payer: Medicaid Other | Admitting: Internal Medicine

## 2020-01-27 ENCOUNTER — Telehealth: Payer: Self-pay

## 2020-01-27 ENCOUNTER — Other Ambulatory Visit: Payer: Self-pay

## 2020-01-27 DIAGNOSIS — Z5329 Procedure and treatment not carried out because of patient's decision for other reasons: Secondary | ICD-10-CM

## 2020-01-27 DIAGNOSIS — Z91199 Patient's noncompliance with other medical treatment and regimen due to unspecified reason: Secondary | ICD-10-CM

## 2020-01-27 NOTE — Telephone Encounter (Signed)
I called the patient to initiate virtual visit at 2:00, patient did not answer, left 2 voicemails, one at 10:30 and one at 2:00. Patient has not returned phone calls.

## 2020-03-08 ENCOUNTER — Other Ambulatory Visit: Payer: Self-pay | Admitting: Family Medicine

## 2020-03-08 DIAGNOSIS — E038 Other specified hypothyroidism: Secondary | ICD-10-CM

## 2020-04-06 ENCOUNTER — Ambulatory Visit: Payer: Medicaid Other | Admitting: Family Medicine

## 2020-05-22 ENCOUNTER — Other Ambulatory Visit: Payer: Self-pay

## 2020-05-22 ENCOUNTER — Encounter: Payer: Self-pay | Admitting: Emergency Medicine

## 2020-05-22 ENCOUNTER — Emergency Department
Admission: EM | Admit: 2020-05-22 | Discharge: 2020-05-22 | Disposition: A | Discharge status: Home / Self Care | Payer: PRIVATE HEALTH INSURANCE | Attending: Emergency Medicine | Admitting: Emergency Medicine

## 2020-05-22 DIAGNOSIS — Z5321 Procedure and treatment not carried out due to patient leaving prior to being seen by health care provider: Secondary | ICD-10-CM | POA: Diagnosis not present

## 2020-05-22 DIAGNOSIS — R630 Anorexia: Secondary | ICD-10-CM | POA: Insufficient documentation

## 2020-05-22 DIAGNOSIS — R350 Frequency of micturition: Secondary | ICD-10-CM | POA: Insufficient documentation

## 2020-05-22 DIAGNOSIS — E119 Type 2 diabetes mellitus without complications: Secondary | ICD-10-CM | POA: Diagnosis present

## 2020-05-22 HISTORY — DX: Polycystic ovarian syndrome: E28.2

## 2020-05-22 LAB — CBC WITH DIFFERENTIAL/PLATELET
Abs Immature Granulocytes: 0.04 10*3/uL (ref 0.00–0.07)
Basophils Absolute: 0.1 10*3/uL (ref 0.0–0.1)
Basophils Relative: 1 %
Eosinophils Absolute: 0.5 10*3/uL (ref 0.0–0.5)
Eosinophils Relative: 5 %
HCT: 40.8 % (ref 36.0–46.0)
Hemoglobin: 13.6 g/dL (ref 12.0–15.0)
Immature Granulocytes: 0 %
Lymphocytes Relative: 42 %
Lymphs Abs: 4.1 10*3/uL — ABNORMAL HIGH (ref 0.7–4.0)
MCH: 28.3 pg (ref 26.0–34.0)
MCHC: 33.3 g/dL (ref 30.0–36.0)
MCV: 84.8 fL (ref 80.0–100.0)
Monocytes Absolute: 0.6 10*3/uL (ref 0.1–1.0)
Monocytes Relative: 6 %
Neutro Abs: 4.6 10*3/uL (ref 1.7–7.7)
Neutrophils Relative %: 46 %
Platelets: 324 10*3/uL (ref 150–400)
RBC: 4.81 MIL/uL (ref 3.87–5.11)
RDW: 12.2 % (ref 11.5–15.5)
WBC: 9.9 10*3/uL (ref 4.0–10.5)
nRBC: 0 % (ref 0.0–0.2)

## 2020-05-22 LAB — COMPREHENSIVE METABOLIC PANEL
ALT: 83 U/L — ABNORMAL HIGH (ref 0–44)
AST: 61 U/L — ABNORMAL HIGH (ref 15–41)
Albumin: 4.3 g/dL (ref 3.5–5.0)
Alkaline Phosphatase: 69 U/L (ref 38–126)
Anion gap: 11 (ref 5–15)
BUN: 14 mg/dL (ref 6–20)
CO2: 28 mmol/L (ref 22–32)
Calcium: 9.2 mg/dL (ref 8.9–10.3)
Chloride: 98 mmol/L (ref 98–111)
Creatinine, Ser: 0.8 mg/dL (ref 0.44–1.00)
GFR calc Af Amer: >60 mL/min (ref >60)
GFR calc non Af Amer: >60 mL/min (ref >60)
Glucose, Bld: 232 mg/dL — ABNORMAL HIGH (ref 70–99)
Potassium: 4.4 mmol/L (ref 3.5–5.1)
Sodium: 137 mmol/L (ref 135–145)
Total Bilirubin: 0.9 mg/dL (ref 0.3–1.2)
Total Protein: 7.5 g/dL (ref 6.5–8.1)

## 2020-05-22 LAB — URINALYSIS, COMPLETE (UACMP) WITH MICROSCOPIC
Bilirubin Urine: NEGATIVE
Glucose, UA: >=500 mg/dL — AB
Ketones, ur: NEGATIVE mg/dL
Nitrite: NEGATIVE
Protein, ur: 30 mg/dL — AB
RBC / HPF: >50 RBC/hpf — ABNORMAL HIGH (ref 0–5)
Specific Gravity, Urine: 1.016 (ref 1.005–1.030)
WBC, UA: >50 WBC/hpf — ABNORMAL HIGH (ref 0–5)
pH: 5 (ref 5.0–8.0)

## 2020-05-22 NOTE — ED Notes (Signed)
CBG 209 in triage

## 2020-05-22 NOTE — ED Triage Notes (Signed)
Pt presents to ED with elevated blood sugar >500 today. Pt states she has never had hyperglycemia before and reports her normal blood glucose is 170-230 since she was dx with diabetes a year ago. Urinating more often and decrease in appetite.

## 2020-05-23 ENCOUNTER — Emergency Department
Admission: EM | Admit: 2020-05-23 | Discharge: 2020-05-23 | Disposition: A | Discharge status: Home / Self Care | Payer: PRIVATE HEALTH INSURANCE | Attending: Emergency Medicine | Admitting: Emergency Medicine

## 2020-05-23 ENCOUNTER — Encounter: Payer: Self-pay | Admitting: Emergency Medicine

## 2020-05-23 DIAGNOSIS — Z5321 Procedure and treatment not carried out due to patient leaving prior to being seen by health care provider: Secondary | ICD-10-CM | POA: Diagnosis not present

## 2020-05-23 DIAGNOSIS — R7309 Other abnormal glucose: Secondary | ICD-10-CM | POA: Insufficient documentation

## 2020-05-23 LAB — CBC
HCT: 39.2 % (ref 36.0–46.0)
Hemoglobin: 13.1 g/dL (ref 12.0–15.0)
MCH: 28.3 pg (ref 26.0–34.0)
MCHC: 33.4 g/dL (ref 30.0–36.0)
MCV: 84.7 fL (ref 80.0–100.0)
Platelets: 299 10*3/uL (ref 150–400)
RBC: 4.63 MIL/uL (ref 3.87–5.11)
RDW: 12.3 % (ref 11.5–15.5)
WBC: 6.9 10*3/uL (ref 4.0–10.5)
nRBC: 0 % (ref 0.0–0.2)

## 2020-05-23 LAB — BASIC METABOLIC PANEL
Anion gap: 11 (ref 5–15)
BUN: 13 mg/dL (ref 6–20)
CO2: 27 mmol/L (ref 22–32)
Calcium: 8.8 mg/dL — ABNORMAL LOW (ref 8.9–10.3)
Chloride: 98 mmol/L (ref 98–111)
Creatinine, Ser: 0.7 mg/dL (ref 0.44–1.00)
GFR calc Af Amer: >60 mL/min (ref >60)
GFR calc non Af Amer: >60 mL/min (ref >60)
Glucose, Bld: 244 mg/dL — ABNORMAL HIGH (ref 70–99)
Potassium: 3.9 mmol/L (ref 3.5–5.1)
Sodium: 136 mmol/L (ref 135–145)

## 2020-05-23 LAB — POCT PREGNANCY, URINE: Preg Test, Ur: NEGATIVE

## 2020-05-23 LAB — GLUCOSE, CAPILLARY
Glucose-Capillary: 209 mg/dL — ABNORMAL HIGH (ref 70–99)
Glucose-Capillary: 241 mg/dL — ABNORMAL HIGH (ref 70–99)

## 2020-05-23 NOTE — ED Triage Notes (Signed)
Patient to ER for c/o fluctuating blood sugars. States yesterday her meter read >500, came to ER and had reading of 209. States this am her blood sugar was 75. Patient denies feeling bad.

## 2020-05-23 NOTE — ED Notes (Signed)
Pt has been called several times for blood sugar check and urine, pt not visualized outside or in lobby.

## 2020-12-23 ENCOUNTER — Emergency Department
Admission: EM | Admit: 2020-12-23 | Discharge: 2020-12-24 | Disposition: A | Discharge status: Home / Self Care | Payer: PRIVATE HEALTH INSURANCE | Attending: Emergency Medicine | Admitting: Emergency Medicine

## 2020-12-23 ENCOUNTER — Other Ambulatory Visit: Payer: Self-pay

## 2020-12-23 DIAGNOSIS — J45909 Unspecified asthma, uncomplicated: Secondary | ICD-10-CM | POA: Insufficient documentation

## 2020-12-23 DIAGNOSIS — Z7984 Long term (current) use of oral hypoglycemic drugs: Secondary | ICD-10-CM | POA: Diagnosis not present

## 2020-12-23 DIAGNOSIS — I10 Essential (primary) hypertension: Secondary | ICD-10-CM | POA: Insufficient documentation

## 2020-12-23 DIAGNOSIS — E1165 Type 2 diabetes mellitus with hyperglycemia: Secondary | ICD-10-CM | POA: Diagnosis not present

## 2020-12-23 DIAGNOSIS — N390 Urinary tract infection, site not specified: Secondary | ICD-10-CM | POA: Diagnosis not present

## 2020-12-23 DIAGNOSIS — R739 Hyperglycemia, unspecified: Secondary | ICD-10-CM

## 2020-12-23 DIAGNOSIS — E039 Hypothyroidism, unspecified: Secondary | ICD-10-CM | POA: Diagnosis not present

## 2020-12-23 DIAGNOSIS — Z79899 Other long term (current) drug therapy: Secondary | ICD-10-CM | POA: Diagnosis not present

## 2020-12-23 LAB — URINALYSIS, COMPLETE (UACMP) WITH MICROSCOPIC
Bilirubin Urine: NEGATIVE
Glucose, UA: >=500 mg/dL — AB
Ketones, ur: NEGATIVE mg/dL
Nitrite: NEGATIVE
Protein, ur: 100 mg/dL — AB
RBC / HPF: >50 RBC/hpf — ABNORMAL HIGH (ref 0–5)
Specific Gravity, Urine: 1.016 (ref 1.005–1.030)
Squamous Epithelial / HPF: NONE SEEN (ref 0–5)
WBC, UA: >50 WBC/hpf — ABNORMAL HIGH (ref 0–5)
pH: 6 (ref 5.0–8.0)

## 2020-12-23 LAB — CBC
HCT: 42.7 % (ref 36.0–46.0)
Hemoglobin: 14 g/dL (ref 12.0–15.0)
MCH: 27.6 pg (ref 26.0–34.0)
MCHC: 32.8 g/dL (ref 30.0–36.0)
MCV: 84.1 fL (ref 80.0–100.0)
Platelets: 336 10*3/uL (ref 150–400)
RBC: 5.08 MIL/uL (ref 3.87–5.11)
RDW: 12 % (ref 11.5–15.5)
WBC: 8.8 10*3/uL (ref 4.0–10.5)
nRBC: 0 % (ref 0.0–0.2)

## 2020-12-23 LAB — BASIC METABOLIC PANEL
Anion gap: 9 (ref 5–15)
BUN: 7 mg/dL (ref 6–20)
CO2: 26 mmol/L (ref 22–32)
Calcium: 9.1 mg/dL (ref 8.9–10.3)
Chloride: 101 mmol/L (ref 98–111)
Creatinine, Ser: 0.71 mg/dL (ref 0.44–1.00)
GFR, Estimated: >60 mL/min (ref >60)
Glucose, Bld: 250 mg/dL — ABNORMAL HIGH (ref 70–99)
Potassium: 3.4 mmol/L — ABNORMAL LOW (ref 3.5–5.1)
Sodium: 136 mmol/L (ref 135–145)

## 2020-12-23 LAB — PREGNANCY, URINE: Preg Test, Ur: NEGATIVE

## 2020-12-23 LAB — CBG MONITORING, ED: Glucose-Capillary: 238 mg/dL — ABNORMAL HIGH (ref 70–99)

## 2020-12-23 MED ORDER — CEPHALEXIN 500 MG PO CAPS: 500.0000 mg | ORAL_CAPSULE | Freq: Two times a day (BID) | ORAL | 0 refills | Status: DC

## 2020-12-23 MED ORDER — CEPHALEXIN 500 MG PO CAPS
500.0000 mg | ORAL_CAPSULE | Freq: Once | ORAL | Status: AC
  Administered 2020-12-23: 500 mg via ORAL
  Filled 2020-12-23: qty 1

## 2020-12-23 NOTE — ED Provider Notes (Signed)
[facility] Emergency Department Provider Note  ____________________________________________   Event Date/Time   First MD Initiated Contact with Patient 12/23/20 2324     (approximate)  I have reviewed the triage vital signs and the nursing notes.   HISTORY  Chief Complaint Hypertension and Hyperglycemia    HPI Leslie Allison is a 25 y.o. female with history of non-insulin-dependent diabetes, hypothyroidism, PCOS, obesity, asthma who presents to the emergency department with concerns for hypertension and hyperglycemia.  She states earlier today she felt her legs were very shaky and she felt weak all over.  She states she had to get her husband and mother-in-law all to help her get up from the couch.  She states that she "did not feel right" so she checked her blood pressures at home and they were in the 150s/100s and then in the 170s/100s.  She does not check her blood pressure regularly and does not remember the last time it was checked by a doctor.  She denies known history of hypertension.  She also checked her blood sugar at home and it was 388.  She is on Metformin and reports compliance.  Does not check her blood sugars regularly.    She denies having any chest pain, SOB, focal numbness or weakness, no V/D, no fever, no vision changes.  Did have a headache that has now resolved.  States she is feeling better.  She has had dysuria.  She is not currently on her menstrual cycle.  She did have a urinary tract infection about a month ago and finished antibiotics.  On review of her records, it appears patient has grown Proteus and E. coli on previous urine cultures.    Past Medical History:  Diagnosis Date  . Amenorrhea   . Asthma   . Diabetes mellitus without complication ([place])   . Hypothyroidism   . PCOS (polycystic ovarian syndrome)   . Thyroid disease     Patient Active Problem List   Diagnosis Date Noted  . Microalbuminuria due to type 2 diabetes  mellitus ([place]) 01/04/2020  . Extrinsic asthma 01/04/2020  . Seasonal allergic rhinitis due to pollen 01/04/2020  . Reactive depression 01/04/2020  . Hyperlipidemia associated with type 2 diabetes mellitus ([place]) 05/25/2019  . Essential hypertension 05/25/2019  . Acquired hypothyroidism 05/25/2019  . Elevated transaminase level 05/25/2019  . Irregular uterine bleeding 12/21/2018  . Abnormal thyroid blood test 12/21/2018  . PCO (polycystic ovaries) 12/10/2016  . Vaginal discharge 11/21/2016  . Amenorrhea 07/18/2016  . Morbid obesity with BMI of 60.0-69.9, adult ([place]) 07/18/2016  . Mentally challenged 07/18/2016  . Family history of breast cancer in mother 07/18/2016    Past Surgical History:  Procedure Laterality Date  . NO PAST SURGERIES      Prior to Admission medications   Medication Sig Start Date End Date Taking? Authorizing Provider  cephALEXin (KEFLEX) 500 MG capsule Take 1 capsule (500 mg total) by mouth 2 (two) times daily. 12/23/20  Yes Ward, Delice Bison, DO  albuterol (VENTOLIN HFA) 108 (90 Base) MCG/ACT inhaler Inhale 2 puffs into the lungs every 6 (six) hours as needed for wheezing or shortness of breath. 01/04/20   Towanda Malkin, MD  blood glucose meter kit and supplies KIT Dispense based on patient and insurance preference. Use 1-2 times daily as needed. 05/25/19   Hubbard Hartshorn, FNP  glucose blood test strip Use as instructed 06/16/19   Hubbard Hartshorn, FNP  labetalol (NORMODYNE) 200 MG tablet Take  1 tablet by mouth twice daily 09/29/19   Schuman, Stefanie Libel, MD  levothyroxine (EUTHYROX) 25 MCG tablet TAKE 1 & 1/2 (ONE & ONE-HALF) TABLETS BY MOUTH ONCE DAILY BEFORE BREAKFAST (MUST  COME  FOR  LABS) 01/04/20   Towanda Malkin, MD  lisinopril (ZESTRIL) 10 MG tablet Take 1 tablet (10 mg total) by mouth daily. 01/04/20   Towanda Malkin, MD  metFORMIN (GLUCOPHAGE) 500 MG tablet Take 1 tablet (500 mg total) by mouth 2 (two) times daily with a meal. 04/06/19    Hubbard Hartshorn, FNP  norelgestromin-ethinyl estradiol Marilu Favre) 150-35 MCG/24HR transdermal patch Place 1 patch onto the skin once a week. Patient not taking: Reported on 01/04/2020 02/11/19   Homero Fellers, MD  predniSONE (STERAPRED UNI-PAK 21 TAB) 10 MG (21) TBPK tablet Take 6 pills on day one then decrease by 1 pill each day 10/24/19   Versie Starks, PA-C  rosuvastatin (CRESTOR) 10 MG tablet Take 1 tablet (10 mg total) by mouth daily. 08/24/19   Hubbard Hartshorn, FNP    Allergies Amoxicillin  Family History  Problem Relation Age of Onset  . Breast cancer Mother   . Ovarian cancer Neg Hx   . Colon cancer Neg Hx   . Diabetes Neg Hx   . Heart disease Neg Hx     Social History Social History   Tobacco Use  . Smoking status: Never Smoker  . Smokeless tobacco: Never Used  Vaping Use  . Vaping Use: Never used  Substance Use Topics  . Alcohol use: Not Currently    Comment: occasional wine  . Drug use: No    Review of Systems Constitutional: No fever. Eyes: No visual changes. ENT: No sore throat. Cardiovascular: Denies chest pain. Respiratory: Denies shortness of breath. Gastrointestinal: No nausea, vomiting, diarrhea. Genitourinary: + dysuria. Musculoskeletal: Negative for back pain. Skin: Negative for rash. Neurological: Negative for focal weakness or numbness.  ____________________________________________   PHYSICAL EXAM:  VITAL SIGNS: ED Triage Vitals [12/23/20 2132]  Enc Vitals Group     BP (!) 153/108     Pulse Rate 89     Resp 17     Temp 98.2 F (36.8 C)     Temp src      SpO2 98 %     Weight (!) 317 lb (143.8 kg)     Height 5' 2"  (1.575 m)     Head Circumference      Peak Flow      Pain Score 7     Pain Loc      Pain Edu?      Excl. in [place]?    CONSTITUTIONAL: Alert and oriented and responds appropriately to questions. Well-appearing; well-nourished, obese HEAD: Normocephalic EYES: Conjunctivae clear, pupils appear equal, EOM appear  intact ENT: normal nose; moist mucous membranes NECK: Supple, normal ROM CARD: RRR; S1 and S2 appreciated; no murmurs, no clicks, no rubs, no gallops RESP: Normal chest excursion without splinting or tachypnea; breath sounds clear and equal bilaterally; no wheezes, no rhonchi, no rales, no hypoxia or respiratory distress, speaking full sentences ABD/GI: Normal bowel sounds; non-distended; soft, non-tender, no rebound, no guarding, no peritoneal signs, no hepatosplenomegaly BACK: The back appears normal EXT: Normal ROM in all joints; no deformity noted, no edema; no cyanosis, no calf tenderness or calf swelling SKIN: Normal color for age and race; warm; no rash on exposed skin NEURO: Moves all extremities equally, no facial asymmetry, normal speech, normal gait PSYCH: The patient's  mood and manner are appropriate.  ____________________________________________   LABS (all labs ordered are listed, but only abnormal results are displayed)  Labs Reviewed  BASIC METABOLIC PANEL - Abnormal; Notable for the following components:      Result Value   Potassium 3.4 (*)    Glucose, Bld 250 (*)    All other components within normal limits  URINALYSIS, COMPLETE (UACMP) WITH MICROSCOPIC - Abnormal; Notable for the following components:   Color, Urine YELLOW (*)    APPearance CLOUDY (*)    Glucose, UA >=500 (*)    Hgb urine dipstick LARGE (*)    Protein, ur 100 (*)    Leukocytes,Ua LARGE (*)    RBC / HPF >50 (*)    WBC, UA >50 (*)    Bacteria, UA RARE (*)    All other components within normal limits  CBG MONITORING, ED - Abnormal; Notable for the following components:   Glucose-Capillary 238 (*)    All other components within normal limits  URINE CULTURE  CBC  PREGNANCY, URINE  CBG MONITORING, ED   ____________________________________________  EKG  None ____________________________________________  RADIOLOGY I, Kristen Ward, personally viewed and evaluated these images (plain  radiographs) as part of my medical decision making, as well as reviewing the written report by the radiologist.  ED MD interpretation: None  Official radiology report(s): No results found.  ____________________________________________   PROCEDURES  Procedure(s) performed (including Critical Care):  Procedures  ____________________________________________   INITIAL IMPRESSION / ASSESSMENT AND PLAN / ED COURSE  As part of my medical decision making, I reviewed the following data within the [place] History obtained from family, Nursing notes reviewed and incorporated, Labs reviewed , Old chart reviewed and Notes from prior ED visits         Patient here with concerns for hypertension, hypoglycemia and generalized weakness that has now resolved.  Labs show glucose of 238 but no DKA.  Blood pressure was elevated but now has come down to 137/92.  Labs reassuring.  She does appear to have a urinary tract infection.  We will add on urine culture and discharged on Keflex.  Have recommended that she monitor her blood sugars and blood pressure at home closely and keep a log of this information to take to her primary care doctor in [place].  I do not feel that we need to adjust her Metformin from the ED or start her on blood pressure medications given these isolated elevated numbers especially since she does not monitor them daily.  She is now asymptomatic.  I feel she is safe to be discharged home.  At this time, I do not feel there is any life-threatening condition present. I have reviewed, interpreted and discussed all results (EKG, imaging, lab, urine as appropriate) and exam findings with patient/family. I have reviewed nursing notes and appropriate previous records.  I feel the patient is safe to be discharged home without further emergent workup and can continue workup as an outpatient as needed. Discussed usual and customary return precautions. Patient/family verbalize  understanding and are comfortable with this plan.  Outpatient follow-up has been provided as needed. All questions have been answered.  ____________________________________________   FINAL CLINICAL IMPRESSION(S) / ED DIAGNOSES  Final diagnoses:  Acute UTI  Hyperglycemia  Hypertension, unspecified type     ED Discharge Orders         Ordered    cephALEXin (KEFLEX) 500 MG capsule  2 times daily  12/23/20 2359          *Please note:  Kelicia Youtz was evaluated in Emergency Department on 12/24/2020 for the symptoms described in the history of present illness. She was evaluated in the context of the global COVID-19 pandemic, which necessitated consideration that the patient might be at risk for infection with the SARS-CoV-2 virus that causes COVID-19. Institutional protocols and algorithms that pertain to the evaluation of patients at risk for COVID-19 are in a state of rapid change based on information released by regulatory bodies including the CDC and federal and state organizations. These policies and algorithms were followed during the patient's care in the ED.  Some ED evaluations and interventions may be delayed as a result of limited staffing during and the pandemic.*   Note:  This document was prepared using Dragon voice recognition software and may include unintentional dictation errors.   Ward, Delice Bison, DO 12/24/20 0009

## 2020-12-23 NOTE — ED Triage Notes (Signed)
Pt states coming in for high blood sugar and high blood pressure. Pt denies chest pain, but states she did have an episode of her leg shaking and has a headache. Pt states phone nurse told her to come to the hospital.

## 2020-12-23 NOTE — Discharge Instructions (Addendum)
I recommend that you continue your Metformin as prescribed.  Please check your blood sugar every morning before eating and then 2 hours after every meal and keep a log of this.  I also recommend that you check your blood pressure every morning and every night and keep a log of this so that you can follow-up with your primary care doctor to determine if you need to be on any blood pressure medications.  Please take your antibiotics for your urinary tract infection until complete.

## 2020-12-26 LAB — URINE CULTURE: Culture: 50000 — AB

## 2021-03-27 ENCOUNTER — Other Ambulatory Visit: Payer: Self-pay

## 2021-03-27 ENCOUNTER — Encounter: Payer: Self-pay | Admitting: Emergency Medicine

## 2021-03-27 ENCOUNTER — Emergency Department
Admission: EM | Admit: 2021-03-27 | Discharge: 2021-03-27 | Disposition: A | Discharge status: Home / Self Care | Payer: PRIVATE HEALTH INSURANCE | Attending: Physician Assistant | Admitting: Physician Assistant

## 2021-03-27 DIAGNOSIS — E119 Type 2 diabetes mellitus without complications: Secondary | ICD-10-CM | POA: Insufficient documentation

## 2021-03-27 DIAGNOSIS — Z7984 Long term (current) use of oral hypoglycemic drugs: Secondary | ICD-10-CM | POA: Insufficient documentation

## 2021-03-27 DIAGNOSIS — Z9622 Myringotomy tube(s) status: Secondary | ICD-10-CM

## 2021-03-27 DIAGNOSIS — H9202 Otalgia, left ear: Secondary | ICD-10-CM

## 2021-03-27 DIAGNOSIS — Z79899 Other long term (current) drug therapy: Secondary | ICD-10-CM | POA: Insufficient documentation

## 2021-03-27 DIAGNOSIS — H66002 Acute suppurative otitis media without spontaneous rupture of ear drum, left ear: Secondary | ICD-10-CM | POA: Insufficient documentation

## 2021-03-27 DIAGNOSIS — J45909 Unspecified asthma, uncomplicated: Secondary | ICD-10-CM | POA: Insufficient documentation

## 2021-03-27 DIAGNOSIS — E039 Hypothyroidism, unspecified: Secondary | ICD-10-CM | POA: Insufficient documentation

## 2021-03-27 MED ORDER — AZITHROMYCIN 250 MG PO TABS: ORAL_TABLET | ORAL | 0 refills | Status: AC

## 2021-03-27 MED ORDER — CIPRO HC 0.2-1 % OT SUSP: 3.0000 [drp] | Freq: Two times a day (BID) | OTIC | 0 refills | Status: DC

## 2021-03-27 NOTE — Discharge Instructions (Addendum)
Take the antibiotic as directed. Use a cotton ball covered in Vaseline to create an ear plug, to avoid getting ANY water into the ear. See [facility] for evaluation for removal of your left ear tube.

## 2021-03-27 NOTE — ED Notes (Signed)
See triage note  Left ear pain  States fever for about 1 week low grade temp on arrival

## 2021-03-27 NOTE — ED Triage Notes (Signed)
Patient ambulatory to triage with steady gait, without difficulty or distress noted; pt reports x 2 days having left ear pain & fever (no meds taken this am); denies any accomp symptoms

## 2021-03-27 NOTE — ED Provider Notes (Signed)
[facility] Emergency Department Provider Note ____________________________________________  Time seen: 0800  I have reviewed the triage vital signs and the nursing notes.  HISTORY  Chief Complaint  Otalgia  HPI Leslie Allison is a 25 y.o. female presents to the ED for evaluation of left otalgia.  Patient gives a 2-day history of left ear pain with intermittent fevers.  She denies any meds taken this morning for fever or pain relief.  She gives remote history of problems with a left the ER including a tympanostomy tube placed as a child.  She has not had any interim follow-up with ENT as an adult.  She denies any vertigo, dizziness, hearing loss, or syncope.  She does note some slightly decreased hearing, as well as some purulent otorrhea  Past Medical History:  Diagnosis Date   Amenorrhea    Asthma    Diabetes mellitus without complication ([place])    Hypothyroidism    PCOS (polycystic ovarian syndrome)    Thyroid disease     Patient Active Problem List   Diagnosis Date Noted   Microalbuminuria due to type 2 diabetes mellitus ([place]) 01/04/2020   Extrinsic asthma 01/04/2020   Seasonal allergic rhinitis due to pollen 01/04/2020   Reactive depression 01/04/2020   Hyperlipidemia associated with type 2 diabetes mellitus ([place]) 05/25/2019   Essential hypertension 05/25/2019   Acquired hypothyroidism 05/25/2019   Elevated transaminase level 05/25/2019   Irregular uterine bleeding 12/21/2018   Abnormal thyroid blood test 12/21/2018   PCO (polycystic ovaries) 12/10/2016   Vaginal discharge 11/21/2016   Amenorrhea 07/18/2016   Morbid obesity with BMI of 60.0-69.9, adult ([place]) 07/18/2016   Mentally challenged 07/18/2016   Family history of breast cancer in mother 07/18/2016    Past Surgical History:  Procedure Laterality Date   NO PAST SURGERIES      Prior to Admission medications   Medication Sig Start Date End Date Taking? Authorizing Provider  azithromycin  (ZITHROMAX Z-PAK) 250 MG tablet Take 2 tablets (500 mg) on  Day 1,  followed by 1 tablet (250 mg) once daily on Days 2 through 5. 03/27/21 04/01/21 Yes Ledon Weihe, Dannielle Karvonen, PA-C  albuterol (VENTOLIN HFA) 108 (90 Base) MCG/ACT inhaler Inhale 2 puffs into the lungs every 6 (six) hours as needed for wheezing or shortness of breath. 01/04/20   Towanda Malkin, MD  blood glucose meter kit and supplies KIT Dispense based on patient and insurance preference. Use 1-2 times daily as needed. 05/25/19   Hubbard Hartshorn, FNP  glucose blood test strip Use as instructed 06/16/19   Hubbard Hartshorn, FNP  labetalol (NORMODYNE) 200 MG tablet Take 1 tablet by mouth twice daily 09/29/19   Schuman, Stefanie Libel, MD  levothyroxine (EUTHYROX) 25 MCG tablet TAKE 1 & 1/2 (ONE & ONE-HALF) TABLETS BY MOUTH ONCE DAILY BEFORE BREAKFAST (MUST  COME  FOR  LABS) 01/04/20   Towanda Malkin, MD  lisinopril (ZESTRIL) 10 MG tablet Take 1 tablet (10 mg total) by mouth daily. 01/04/20   Towanda Malkin, MD  metFORMIN (GLUCOPHAGE) 500 MG tablet Take 1 tablet (500 mg total) by mouth 2 (two) times daily with a meal. 04/06/19   Hubbard Hartshorn, FNP  norelgestromin-ethinyl estradiol Marilu Favre) 150-35 MCG/24HR transdermal patch Place 1 patch onto the skin once a week. Patient not taking: Reported on 01/04/2020 02/11/19   Homero Fellers, MD  predniSONE (STERAPRED UNI-PAK 21 TAB) 10 MG (21) TBPK tablet Take 6 pills on day one then decrease by  1 pill each day 10/24/19   Versie Starks, PA-C  rosuvastatin (CRESTOR) 10 MG tablet Take 1 tablet (10 mg total) by mouth daily. 08/24/19   Hubbard Hartshorn, FNP    Allergies Amoxicillin  Family History  Problem Relation Age of Onset   Breast cancer Mother    Ovarian cancer Neg Hx    Colon cancer Neg Hx    Diabetes Neg Hx    Heart disease Neg Hx     Social History Social History   Tobacco Use   Smoking status: Never   Smokeless tobacco: Never  Vaping Use   Vaping Use: Never  used  Substance Use Topics   Alcohol use: Not Currently    Comment: occasional wine   Drug use: No    Review of Systems  Constitutional: Negative for fever. Eyes: Negative for visual changes. ENT: Negative for sore throat.  Left otalgia and otorrhea as above. Cardiovascular: Negative for chest pain. Respiratory: Negative for shortness of breath. Gastrointestinal: Negative for abdominal pain, vomiting and diarrhea. Genitourinary: Negative for dysuria. Musculoskeletal: Negative for back pain. Skin: Negative for rash. Neurological: Negative for headaches, focal weakness or numbness. ____________________________________________  PHYSICAL EXAM:  VITAL SIGNS: ED Triage Vitals  Enc Vitals Group     BP 03/27/21 0646 129/85     Pulse Rate 03/27/21 0646 (!) 112     Resp 03/27/21 0646 (!) 22     Temp 03/27/21 0646 (!) 100.4 F (38 C)     Temp Source 03/27/21 0646 Oral     SpO2 03/27/21 0646 97 %     Weight 03/27/21 0645 (!) 323 lb (146.5 kg)     Height 03/27/21 0645 _0  (1.575 m)     Head Circumference --      Peak Flow --      Pain Score 03/27/21 0645 9     Pain Loc --      Pain Edu? --      Excl. in [place]? --     Constitutional: Alert and oriented. Well appearing and in no distress. Head: Normocephalic and atraumatic. Eyes: Conjunctivae are normal. PERRL. Normal extraocular movements Ears: Canals clear. TMs intact bilaterally.  Left TM is noted to have a TM tube in place.  There is purulent, malodorous drainage noted in the ear canal.  The canal is clear and without edema or maceration. Cardiovascular: Normal rate, regular rhythm. Normal distal pulses. Respiratory: Normal respiratory effort. No wheezes/rales/rhonchi. Gastrointestinal: Soft and nontender. No distention. Musculoskeletal: Nontender with normal range of motion in all extremities.  Neurologic:  Normal gait without ataxia. Normal speech and language. No gross focal neurologic deficits are appreciated. Skin:  Skin  is warm, dry and intact. No rash noted.  ____________________________________________  PROCEDURES   Procedures ____________________________________________   INITIAL IMPRESSION / ASSESSMENT AND PLAN / ED COURSE  As part of my medical decision making, I reviewed the following data within the [facility] chart reviewed and Notes from prior ED visits   DDX: AOM, otitis externa, TM rupture  Patient ED evaluation of left otalgia the last several days as well as some intermittent low-grade fevers.  Patient presents for evaluation of her symptoms, she was found to have an intact tympanostomy tube on the left, with purulent drainage from the TM.  Patient will be treated empirically for an acute AOM with azithromycin, given her significant amoxicillin allergy.  She is further advised to follow-up with [facility], for evaluation for  possible removal of her TM tube.  Patient is also advised to avoid getting any water into her ear use any Vaseline soaked cottonball.  Return precautions have been discussed.   Lilibeth Opie was evaluated in Emergency Department on 03/27/2021 for the symptoms described in the history of present illness. She was evaluated in the context of the global COVID-19 pandemic, which necessitated consideration that the patient might be at risk for infection with the SARS-CoV-2 virus that causes COVID-19. Institutional protocols and algorithms that pertain to the evaluation of patients at risk for COVID-19 are in a state of rapid change based on information released by regulatory bodies including the CDC and federal and state organizations. These policies and algorithms were followed during the patient's care in the ED. ____________________________________________  FINAL CLINICAL IMPRESSION(S) / ED DIAGNOSES  Final diagnoses:  Otalgia of left ear  S/P tympanotomy with insertion of tube  Non-recurrent acute suppurative otitis media of left ear  without spontaneous rupture of tympanic membrane      Delia Slatten, Dannielle Karvonen, PA-C 03/27/21 0911    Lavonia Drafts, MD 03/27/21 1007

## 2021-06-07 NOTE — Telephone Encounter (Signed)
Patient is scheduled for Nurse visit for 06/08/21 at 10am

## 2021-06-07 NOTE — Telephone Encounter (Signed)
This is a patient who should be scheduled for a nurse visit to drop off a urine sample for possible UTI/ yeast infection

## 2021-06-08 ENCOUNTER — Other Ambulatory Visit: Payer: Self-pay | Admitting: Obstetrics and Gynecology

## 2021-06-08 ENCOUNTER — Other Ambulatory Visit: Payer: Self-pay

## 2021-06-08 ENCOUNTER — Ambulatory Visit (INDEPENDENT_AMBULATORY_CARE_PROVIDER_SITE_OTHER): Payer: Medicaid Other

## 2021-06-08 DIAGNOSIS — B3731 Acute candidiasis of vulva and vagina: Secondary | ICD-10-CM

## 2021-06-08 DIAGNOSIS — N3000 Acute cystitis without hematuria: Secondary | ICD-10-CM

## 2021-06-08 DIAGNOSIS — R300 Dysuria: Secondary | ICD-10-CM

## 2021-06-08 LAB — POCT URINALYSIS DIPSTICK
Appearance: ABNORMAL
Bilirubin, UA: NEGATIVE
Glucose, UA: NEGATIVE
Ketones, UA: NEGATIVE
Nitrite, UA: NEGATIVE
Odor: NORMAL
Protein, UA: POSITIVE — AB
Spec Grav, UA: 1.02 (ref 1.010–1.025)
Urobilinogen, UA: 0.2 E.U./dL
pH, UA: 7.5 (ref 5.0–8.0)

## 2021-06-08 MED ORDER — FLUCONAZOLE 150 MG PO TABS: 150.0000 mg | ORAL_TABLET | ORAL | 0 refills | Status: AC

## 2021-06-08 MED ORDER — NITROFURANTOIN MONOHYD MACRO 100 MG PO CAPS: 100.0000 mg | ORAL_CAPSULE | Freq: Two times a day (BID) | ORAL | 0 refills | Status: AC

## 2021-06-08 NOTE — Progress Notes (Signed)
Patient presents today with complaint of burning with urination and a white vaginal discharge. Urinalysis done per protocol. Results recorded (Mod Leuks, Large Blood). Patient self collected an aptima per Dr. Ellie Lunch instructions.

## 2021-06-13 LAB — NUSWAB VAGINITIS PLUS (VG+)
Candida albicans, NAA: POSITIVE — AB
Candida glabrata, NAA: NEGATIVE
Chlamydia trachomatis, NAA: NEGATIVE
Neisseria gonorrhoeae, NAA: NEGATIVE
Trich vag by NAA: NEGATIVE

## 2021-06-13 LAB — URINE CULTURE

## 2021-06-14 ENCOUNTER — Other Ambulatory Visit: Payer: Self-pay | Admitting: Obstetrics and Gynecology

## 2021-06-14 DIAGNOSIS — B373 Candidiasis of vulva and vagina: Secondary | ICD-10-CM

## 2021-06-14 DIAGNOSIS — B3731 Acute candidiasis of vulva and vagina: Secondary | ICD-10-CM

## 2021-06-14 MED ORDER — FLUCONAZOLE 150 MG PO TABS: 150.0000 mg | ORAL_TABLET | ORAL | 0 refills | Status: AC

## 2021-06-15 ENCOUNTER — Other Ambulatory Visit: Payer: Self-pay

## 2021-06-15 ENCOUNTER — Ambulatory Visit (INDEPENDENT_AMBULATORY_CARE_PROVIDER_SITE_OTHER): Payer: Self-pay | Admitting: Obstetrics and Gynecology

## 2021-06-15 ENCOUNTER — Encounter: Payer: Self-pay | Admitting: Obstetrics and Gynecology

## 2021-06-15 VITALS — BP 150/100 | Ht 62.0 in | Wt 312.4 lb

## 2021-06-15 DIAGNOSIS — E282 Polycystic ovarian syndrome: Secondary | ICD-10-CM

## 2021-06-15 NOTE — Patient Instructions (Signed)
Exercising to Stay Healthy To become healthy and stay healthy, it is recommended that you do moderate-intensity and vigorous-intensity exercise. You can tell that you are exercising at a moderate intensity if your heart starts beating faster and you start breathing faster but can still hold a conversation. You can tell that you are exercising at a vigorous intensity if you are breathing much harder and faster and cannot hold a conversation while exercising. How can exercise benefit me? Exercising regularly is important. It has many health benefits, such as: Improving overall fitness, flexibility, and endurance. Increasing bone density. Helping with weight control. Decreasing body fat. Increasing muscle strength and endurance. Reducing stress and tension, anxiety, depression, or anger. Improving overall health. What guidelines should I follow while exercising? Before you start a new exercise program, talk with your health care provider. Do not exercise so much that you hurt yourself, feel dizzy, or get very short of breath. Wear comfortable clothes and wear shoes with good support. Drink plenty of water while you exercise to prevent dehydration or heat stroke. Work out until your breathing and your heartbeat get faster (moderate intensity). How often should I exercise? Choose an activity that you enjoy, and set realistic goals. Your health care provider can help you make an activity plan that is individually designed and works best for you. Exercise regularly as told by your health care provider. This may include: Doing strength training two times a week, such as: Lifting weights. Using resistance bands. Push-ups. Sit-ups. Yoga. Doing a certain intensity of exercise for a given amount of time. Choose from these options: A total of 150 minutes of moderate-intensity exercise every week. A total of 75 minutes of vigorous-intensity exercise every week. A mix of moderate-intensity and  vigorous-intensity exercise every week. Children, pregnant women, people who have not exercised regularly, people who are overweight, and older adults may need to talk with a health care provider about what activities are safe to perform. If you have a medical condition, be sure to talk with your health care provider before you start a new exercise program. What are some exercise ideas? Moderate-intensity exercise ideas include: Walking 1 mile (1.6 km) in about 15 minutes. Biking. Hiking. Golfing. Dancing. Water aerobics. Vigorous-intensity exercise ideas include: Walking 4.5 miles (7.2 km) or more in about 1 hour. Jogging or running 5 miles (8 km) in about 1 hour. Biking 10 miles (16.1 km) or more in about 1 hour. Lap swimming. Roller-skating or in-line skating. Cross-country skiing. Vigorous competitive sports, such as football, basketball, and soccer. Jumping rope. Aerobic dancing. What are some everyday activities that can help me get exercise? Yard work, such as: Child psychotherapist. Raking and bagging leaves. Washing your car. Pushing a stroller. Shoveling snow. Gardening. Washing windows or floors. How can I be more active in my day-to-day activities? Use stairs instead of an elevator. Take a walk during your lunch break. If you drive, park your car farther away from your work or school. If you take public transportation, get off one stop early and walk the rest of the way. Stand up or walk around during all of your indoor phone calls. Get up, stretch, and walk around every 30 minutes throughout the day. Enjoy exercise with a friend. Support to continue exercising will help you keep a regular routine of activity. Where to find more information You can find more information about exercising to stay healthy from: [place] Department of Health and Human Services: ThisPath.fi Centers for Disease Control  and Prevention (CDC): FootballExhibition.com.br Summary Exercising regularly is  important. It will improve your overall fitness, flexibility, and endurance. Regular exercise will also improve your overall health. It can help you control your weight, reduce stress, and improve your bone density. Do not exercise so much that you hurt yourself, feel dizzy, or get very short of breath. Before you start a new exercise program, talk with your health care provider. This information is not intended to replace advice given to you by your health care provider. Make sure you discuss any questions you have with your health care provider. Document Revised: 01/26/2021 Document Reviewed: 01/26/2021 Elsevier Patient Education  2022 Elsevier Inc. Budget-Friendly Healthy Eating There are many ways to save money at the grocery store and continue to eat healthy. You can be successful if you: Plan meals according to your budget. Make a grocery list and only purchase food according to your grocery list. Prepare food yourself at home. What are tips for following this plan? Reading food labels Compare food labels between brand name foods and the store brand. Often the nutritional value is the same, but the store brand is lower cost. Look for products that do not have added sugar, fat, or salt (sodium). These often cost the same but are healthier for you. Products may be labeled as: Sugar-free. Nonfat. Low-fat. Sodium-free. Low-sodium. Look for lean ground beef labeled as at least 92% lean and 8% fat. Shopping  Buy only the items on your grocery list and go only to the areas of the store that have the items on your list. Use coupons only for foods and brands you normally buy. Avoid buying items you wouldn't normally buy simply because they are on sale. Check online and in newspapers for weekly deals. Buy healthy items from the bulk bins when available, such as herbs, spices, flour, pasta, nuts, and dried fruit. Buy fruits and vegetables that are in season. Prices are usually lower on  in-season produce. Look at the unit price on the price tag. Use it to compare different brands and sizes to find out which item is the best deal. Choose healthy items that are often low-cost, such as carrots, potatoes, apples, bananas, and oranges. Dried or canned beans are a low-cost protein source. Buy in bulk and freeze extra food. Items you can buy in bulk include meats, fish, poultry, frozen fruits, and frozen vegetables. Avoid buying "ready-to-eat" foods, such as pre-cut fruits and vegetables and pre-made salads. If possible, shop around to discover where you can find the best prices. Consider other retailers such as dollar stores, larger [employer], local fruit and vegetable stands, and farmers markets. Do not shop when you are hungry. If you shop while hungry, it may be hard to stick to your list and budget. Resist impulse buying. Use your grocery list as your official plan for the week. Buy a variety of vegetables and fruits by purchasing fresh, frozen, and canned items. Look at the top and bottom shelves for deals. Foods at eye level (eye level of an adult or child) are usually more expensive. Be efficient with your time when shopping. The more time you spend at the store, the more money you are likely to spend. To save money when choosing more expensive foods like meats and dairy: Choose cheaper cuts of meat, such as bone-in chicken thighs and drumsticks instead of skinless and boneless chicken. When you are ready to prepare the chicken, you can remove the skin yourself to make it healthier. Choose lean  meats like chicken or [place] instead of beef. Choose canned seafood, such as tuna, salmon, or sardines. Buy eggs as a low-cost source of protein. Buy dried beans and peas, such as lentils, split peas, or kidney beans instead of meats. Dried beans and peas are a good alternative source of protein. Buy the larger tubs of yogurt instead of individual-sized containers. Choose water  instead of sodas and other sweetened beverages. Avoid buying chips, cookies, and other "junk food." These items are usually expensive and not healthy. Cooking Make extra food and freeze the extras in meal-sized containers or in individual portions for fast meals and snacks. Pre-cook on days when you have extra time to prepare meals in advance. You can keep these meals in the fridge or freezer and reheat for a quick meal. When you come home from the grocery store, wash, peel, and cut fruits and vegetables so they are ready to use and eat. This will help reduce food waste. Meal planning Do not eat out or get fast food. Prepare food at home. Make a grocery list and make sure to bring it with you to the store. If you have a smart phone, you could use your phone to create your shopping list. Plan meals and snacks according to a grocery list and budget you create. Use leftovers in your meal plan for the week. Look for recipes where you can cook once and make enough food for two meals. Prepare budget-friendly types of meals like stews, casseroles, and stir-fry dishes. Try some meatless meals or try "no cook" meals like salads. Make sure that half your plate is filled with fruits or vegetables. Choose from fresh, frozen, or canned fruits and vegetables. If eating canned, remember to rinse them before eating. This will remove any excess salt added for packaging. Summary Eating healthy on a budget is possible if you plan your meals according to your budget, purchase according to your budget and grocery list, and prepare food yourself. Tips for buying more food on a limited budget include buying generic brands, using coupons only for foods you normally buy, and buying healthy items from the bulk bins when available. Tips for buying cheaper food to replace expensive food include choosing cheaper, lean cuts of meat, and buying dried beans and peas. This information is not intended to replace advice given to  you by your health care provider. Make sure you discuss any questions you have with your health care provider. Document Revised: 07/13/2020 Document Reviewed: 07/13/2020 Elsevier Patient Education  2022 Elsevier Inc. Managing Anxiety, Adult After being diagnosed with an anxiety disorder, you may be relieved to know why you have felt or behaved a certain way. You may also feel overwhelmed about the treatment ahead and what it will mean for your life. With care and support, you can manage this condition and recover from it. How to manage lifestyle changes Managing stress and anxiety Stress is your body's reaction to life changes and events, both good and bad. Most stress will last just a few hours, but stress can be ongoing and can lead to more than just stress. Although stress can play a major role in anxiety, it is not the same as anxiety. Stress is usually caused by something external, such as a deadline, test, or competition. Stress normally passes after the triggering event has ended.  Anxiety is caused by something internal, such as imagining a terrible outcome or worrying that something will go wrong that will devastate you. Anxiety often does not  go away even after the triggering event is over, and it can become long-term (chronic) worry. It is important to understand the differences between stress and anxiety and to manage your stress effectively so that it does not lead to an anxious response. Talk with your health care provider or a counselor to learn more about reducing anxiety and stress. He or she may suggest tension reduction techniques, such as: Music therapy. This can include creating or listening to music that you enjoy and that inspires you. Mindfulness-based meditation. This involves being aware of your normal breaths while not trying to control your breathing. It can be done while sitting or walking. Centering prayer. This involves focusing on a word, phrase, or sacred image that  means something to you and brings you peace. Deep breathing. To do this, expand your stomach and inhale slowly through your nose. Hold your breath for 3-5 seconds. Then exhale slowly, letting your stomach muscles relax. Self-talk. This involves identifying thought patterns that lead to anxiety reactions and changing those patterns. Muscle relaxation. This involves tensing muscles and then relaxing them. Choose a tension reduction technique that suits your lifestyle and personality. These techniques take time and practice. Set aside 5-15 minutes a day to do them. Therapists can offer counseling and training in these techniques. The training to help with anxiety may be covered by some insurance plans. Other things you can do to manage stress and anxiety include: Keeping a stress/anxiety diary. This can help you learn what triggers your reaction and then learn ways to manage your response. Thinking about how you react to certain situations. You may not be able to control everything, but you can control your response. Making time for activities that help you relax and not feeling guilty about spending your time in this way. Visual imagery and yoga can help you stay calm and relax.  Medicines Medicines can help ease symptoms. Medicines for anxiety include: Anti-anxiety drugs. Antidepressants. Medicines are often used as a primary treatment for anxiety disorder. Medicines will be prescribed by a health care provider. When used together, medicines, psychotherapy, and tension reduction techniques may be the most effective treatment. Relationships Relationships can play a big part in helping you recover. Try to spend more time connecting with trusted friends and family members. Consider going to couples counseling, taking family education classes, or going to family therapy. Therapy can help you and others better understand your condition. How to recognize changes in your anxiety Everyone responds  differently to treatment for anxiety. Recovery from anxiety happens when symptoms decrease and stop interfering with your daily activities at home or work. This may mean that you will start to: Have better concentration and focus. Worry will interfere less in your daily thinking. Sleep better. Be less irritable. Have more energy. Have improved memory. It is important to recognize when your condition is getting worse. Contact your health care provider if your symptoms interfere with home or work and you feel like your condition is not improving. Follow these instructions at home: Activity Exercise. Most adults should do the following: Exercise for at least 150 minutes each week. The exercise should increase your heart rate and make you sweat (moderate-intensity exercise). Strengthening exercises at least twice a week. Get the right amount and quality of sleep. Most adults need 7-9 hours of sleep each night. Lifestyle  Eat a healthy diet that includes plenty of vegetables, fruits, whole grains, low-fat dairy products, and lean protein. Do not eat a lot of foods that are  high in solid fats, added sugars, or salt. Make choices that simplify your life. Do not use any products that contain nicotine or tobacco, such as cigarettes, e-cigarettes, and chewing tobacco. If you need help quitting, ask your health care provider. Avoid caffeine, alcohol, and certain over-the-counter cold medicines. These may make you feel worse. Ask your pharmacist which medicines to avoid. General instructions Take over-the-counter and prescription medicines only as told by your health care provider. Keep all follow-up visits as told by your health care provider. This is important. Where to find support You can get help and support from these sources: Self-help groups. Online and [employer]. A trusted spiritual leader. Couples counseling. Family education classes. Family therapy. Where to find more  information You may find that joining a support group helps you deal with your anxiety. The following sources can help you locate counselors or support groups near you: Mental Health America: www.mentalhealthamerica.net Anxiety and Depression Association of [place] (ADAA): ProgramCam.de [employer] on Mental Illness (NAMI): www.nami.org Contact a health care provider if you: Have a hard time staying focused or finishing daily tasks. Spend many hours a day feeling worried about everyday life. Become exhausted by worry. Start to have headaches, feel tense, or have nausea. Urinate more than normal. Have diarrhea. Get help right away if you have: A racing heart and shortness of breath. Thoughts of hurting yourself or others. If you ever feel like you may hurt yourself or others, or have thoughts about taking your own life, get help right away. You can go to your nearest emergency department or call: Your local emergency services (911 in the [place] A suicide crisis helpline, such as the National Suicide Prevention Lifeline at (323)116-1269. This is open 24 hours a day. Summary Taking steps to learn and use tension reduction techniques can help calm you and help prevent triggering an anxiety reaction. When used together, medicines, psychotherapy, and tension reduction techniques may be the most effective treatment. Family, friends, and partners can play a big part in helping you recover from an anxiety disorder. This information is not intended to replace advice given to you by your health care provider. Make sure you discuss any questions you have with your health care provider. Document Revised: 03/02/2019 Document Reviewed: 03/02/2019 Elsevier Patient Education  2022 Elsevier Inc. Managing Depression, Adult Depression is a mental health condition that affects your thoughts, feelings, and actions. Being diagnosed with depression can bring you relief if you did not know why you have felt  or behaved a certain way. It could also leave you feeling overwhelmed with uncertainty about your future. Preparing yourself to manage your symptoms can help you feel more positive about your future. How to manage lifestyle changes Managing stress Stress is your body's reaction to life changes and events, both good and bad. Stress can add to your feelings of depression. Learning to manage your stress can help lessen your feelings of depression. Try some of the following approaches to reducing your stress (stress reduction techniques): Listen to music that you enjoy and that inspires you. Try using a meditation app or take a meditation class. Develop a practice that helps you connect with your spiritual self. Walk in nature, pray, or go to a place of worship. Do some deep breathing. To do this, inhale slowly through your nose. Pause at the top of your inhale for a few seconds and then exhale slowly, letting your muscles relax. Practice yoga to help relax and work your muscles. Choose a stress  reduction technique that suits your lifestyle and personality. These techniques take time and practice to develop. Set aside 5-15 minutes a day to do them. Therapists can offer training in these techniques. Other things you can do to manage stress include: Keeping a stress diary. Knowing your limits and saying no when you think something is too much. Paying attention to how you react to certain situations. You may not be able to control everything, but you can change your reaction. Adding humor to your life by watching funny films or TV shows. Making time for activities that you enjoy and that relax you.  Medicines Medicines, such as antidepressants, are often a part of treatment for depression. Talk with your pharmacist or health care provider about all the medicines, supplements, and herbal products that you take, their possible side effects, and what medicines and other products are safe to take  together. Make sure to report any side effects you may have to your health care provider. Relationships Your health care provider may suggest family therapy, couples therapy, or individual therapy as part of your treatment. How to recognize changes Everyone responds differently to treatment for depression. As you recover from depression, you may start to: Have more interest in doing activities. Feel less hopeless. Have more energy. Overeat less often, or have a better appetite. Have better mental focus. It is important to recognize if your depression is not getting better or is getting worse. The symptoms you had in the beginning may return, such as: Tiredness (fatigue) or low energy. Eating too much or too little. Sleeping too much or too little. Feeling restless, agitated, or hopeless. Trouble focusing or making decisions. Unexplained physical complaints. Feeling irritable, angry, or aggressive. If you or your family members notice these symptoms coming back, let your health care provider know right away. Follow these instructions at home: Activity  Try to get some form of exercise each day, such as walking, biking, swimming, or lifting weights. Practice stress reduction techniques. Engage your mind by taking a class or doing some volunteer work. Lifestyle Get the right amount and quality of sleep. Cut down on using caffeine, tobacco, alcohol, and other potentially harmful substances. Eat a healthy diet that includes plenty of vegetables, fruits, whole grains, low-fat dairy products, and lean protein. Do not eat a lot of foods that are high in solid fats, added sugars, or salt (sodium). General instructions Take over-the-counter and prescription medicines only as told by your health care provider. Keep all follow-up visits as told by your health care provider. This is important. Where to find support Talking to others Friends and family members can be sources of support and  guidance. Talk to trusted friends or family members about your condition. Explain your symptoms to them, and let them know that you are working with a health care provider to treat your depression. Tell friends and family members how they also can be helpful. Finances Find appropriate mental health providers that fit with your financial situation. Talk with your health care provider about options to get reduced prices on your medicines. Where to find more information You can find support in your area from: Anxiety and Depression Association of America (ADAA): www.adaa.org Mental Health America: www.mentalhealthamerica.net The [employer] on Mental Illness: www.nami.org Contact a health care provider if: You stop taking your antidepressant medicines, and you have any of these symptoms: Nausea. Headache. Light-headedness. Chills and body aches. Not being able to sleep (insomnia). You or your friends and family think your depression is  getting worse. Get help right away if: You have thoughts of hurting yourself or others. If you ever feel like you may hurt yourself or others, or have thoughts about taking your own life, get help right away. Go to your nearest emergency department or: Call your local emergency services (911 in the [place] Call a suicide crisis helpline, such as the National Suicide Prevention Lifeline at 2622639100. This is open 24 hours a day in the [place] Text the Crisis Text Line at 956 726 4148 (in the [place] Summary If you are diagnosed with depression, preparing yourself to manage your symptoms is a good way to feel positive about your future. Work with your health care provider on a management plan that includes stress reduction techniques, medicines (if applicable), therapy, and healthy lifestyle habits. Keep talking with your health care provider about how your treatment is working. If you have thoughts about taking your own life, call a suicide crisis helpline or text a  crisis text line. This information is not intended to replace advice given to you by your health care provider. Make sure you discuss any questions you have with your health care provider. Document Revised: 08/11/2019 Document Reviewed: 08/11/2019 Elsevier Patient Education  2022 [employer].

## 2021-06-15 NOTE — Progress Notes (Signed)
Patient ID: Leslie Allison, female   DOB: November 10, 1995, 25 y.o.   MRN: [id number]  Reason for Consult: Gynecologic Exam   Referred by Hubbard Hartshorn, FNP  Subjective:     HPI:  Leslie Allison is a 25 y.o. female she is present today's visit with her husband to discuss her irregular menstrual cycle and her desires for pregnancy.  She has a history of PCOS but has not yet been successful with lifestyle changes.  Patient reports significant stressors with her relationship with her family.  Gynecological History  No LMP recorded. (Menstrual status: Irregular Periods).   Past Medical History:  Diagnosis Date   Amenorrhea    Asthma    Diabetes mellitus without complication (HCC)    Hypothyroidism    PCOS (polycystic ovarian syndrome)    Thyroid disease    Family History  Problem Relation Age of Onset   Breast cancer Mother    Ovarian cancer Neg Hx    Colon cancer Neg Hx    Diabetes Neg Hx    Heart disease Neg Hx    Past Surgical History:  Procedure Laterality Date   NO PAST SURGERIES      Short Social History:  Social History   Tobacco Use   Smoking status: Never   Smokeless tobacco: Never  Substance Use Topics   Alcohol use: Not Currently    Comment: occasional wine    Allergies  Allergen Reactions   Amoxicillin     Has patient had a PCN reaction causing immediate rash, facial/tongue/throat swelling, SOB or lightheadedness with hypotension: No Has patient had a PCN reaction causing severe rash involving mucus membranes or skin necrosis: No Has patient had a PCN reaction that required hospitalization: No Has patient had a PCN reaction occurring within the last 10 years: No If all of the above answers are "NO", then may proceed with Cephalosporin use.      Current Outpatient Medications  Medication Sig Dispense Refill   albuterol (VENTOLIN HFA) 108 (90 Base) MCG/ACT inhaler Inhale 2 puffs into the lungs every 6 (six) hours as needed for wheezing or shortness of  breath. 8 g 1   blood glucose meter kit and supplies KIT Dispense based on patient and insurance preference. Use 1-2 times daily as needed. 1 each 0   fluconazole (DIFLUCAN) 150 MG tablet Take 1 tablet (150 mg total) by mouth every 3 (three) days for 2 doses. 2 tablet 0   glucose blood test strip Use as instructed 100 each 12   labetalol (NORMODYNE) 200 MG tablet Take 1 tablet by mouth twice daily 60 tablet 0   levothyroxine (EUTHYROX) 25 MCG tablet TAKE 1 & 1/2 (ONE & ONE-HALF) TABLETS BY MOUTH ONCE DAILY BEFORE BREAKFAST (MUST  COME  FOR  LABS) 135 tablet 1   lisinopril (ZESTRIL) 10 MG tablet Take 1 tablet (10 mg total) by mouth daily. 90 tablet 3   metFORMIN (GLUCOPHAGE) 500 MG tablet Take 1 tablet (500 mg total) by mouth 2 (two) times daily with a meal. 180 tablet 1   norelgestromin-ethinyl estradiol (XULANE) 150-35 MCG/24HR transdermal patch Place 1 patch onto the skin once a week. 3 patch 12   predniSONE (STERAPRED UNI-PAK 21 TAB) 10 MG (21) TBPK tablet Take 6 pills on day one then decrease by 1 pill each day 21 tablet 0   rosuvastatin (CRESTOR) 10 MG tablet Take 1 tablet (10 mg total) by mouth daily. 90 tablet 3   No current facility-administered medications for this visit.  Review of Systems  Constitutional: Negative for chills, fatigue, fever and unexpected weight change.  HENT: Negative for trouble swallowing.  Eyes: Negative for loss of vision.  Respiratory: Negative for cough, shortness of breath and wheezing.  Cardiovascular: Negative for chest pain, leg swelling, palpitations and syncope.  GI: Negative for abdominal pain, blood in stool, diarrhea, nausea and vomiting.  GU: Negative for difficulty urinating, dysuria, frequency and hematuria.  Musculoskeletal: Negative for back pain, leg pain and joint pain.  Skin: Negative for rash.  Neurological: Negative for dizziness, headaches, light-headedness, numbness and seizures.  Psychiatric: Negative for behavioral problem,  confusion, depressed mood and sleep disturbance.       Objective:  Objective   Vitals:   06/15/21 1002  BP: (!) 150/100  Weight: (!) 312 lb 6.4 oz (141.7 kg)  Height: 5' 2" (1.575 m)   Body mass index is 57.14 kg/m.  Physical Exam Vitals and nursing note reviewed. Exam conducted with a chaperone present.  Constitutional:      Appearance: Normal appearance.  HENT:     Head: Normocephalic and atraumatic.  Eyes:     Extraocular Movements: Extraocular movements intact.     Pupils: Pupils are equal, round, and reactive to light.  Cardiovascular:     Rate and Rhythm: Normal rate and regular rhythm.  Pulmonary:     Effort: Pulmonary effort is normal.     Breath sounds: Normal breath sounds.  Abdominal:     General: Abdomen is flat.     Palpations: Abdomen is soft.  Musculoskeletal:     Cervical back: Normal range of motion.  Skin:    General: Skin is warm and dry.  Neurological:     General: No focal deficit present.     Mental Status: She is alert and oriented to person, place, and time.  Psychiatric:        Behavior: Behavior normal.        Thought Content: Thought content normal.        Judgment: Judgment normal.    Assessment/Plan:     25 year old with PCOS Provided lifestyle counseling.  Provided the patient with information from [employer] of lifestyle medicine about PCOS and lifestyle interventions.  More than 30 minutes were spent face to face with the patient in the room, reviewing the medical record, labs and images, and coordinating care for the patient. The plan of management was discussed in detail and counseling was provided.    Adrian Prows MD [facility], [facility] 06/15/2021 10:19 AM

## 2021-07-24 ENCOUNTER — Emergency Department
Admission: EM | Admit: 2021-07-24 | Discharge: 2021-07-24 | Disposition: A | Discharge status: Home / Self Care | Payer: Medicaid Other | Attending: Emergency Medicine | Admitting: Emergency Medicine

## 2021-07-24 ENCOUNTER — Other Ambulatory Visit: Payer: Self-pay

## 2021-07-24 ENCOUNTER — Encounter: Payer: Self-pay | Admitting: Emergency Medicine

## 2021-07-24 DIAGNOSIS — J45909 Unspecified asthma, uncomplicated: Secondary | ICD-10-CM | POA: Insufficient documentation

## 2021-07-24 DIAGNOSIS — E119 Type 2 diabetes mellitus without complications: Secondary | ICD-10-CM | POA: Insufficient documentation

## 2021-07-24 DIAGNOSIS — Z7984 Long term (current) use of oral hypoglycemic drugs: Secondary | ICD-10-CM | POA: Insufficient documentation

## 2021-07-24 DIAGNOSIS — H66002 Acute suppurative otitis media without spontaneous rupture of ear drum, left ear: Secondary | ICD-10-CM | POA: Insufficient documentation

## 2021-07-24 DIAGNOSIS — Z79899 Other long term (current) drug therapy: Secondary | ICD-10-CM | POA: Insufficient documentation

## 2021-07-24 DIAGNOSIS — E039 Hypothyroidism, unspecified: Secondary | ICD-10-CM | POA: Insufficient documentation

## 2021-07-24 DIAGNOSIS — I10 Essential (primary) hypertension: Secondary | ICD-10-CM | POA: Insufficient documentation

## 2021-07-24 MED ORDER — CEFDINIR 300 MG PO CAPS: 300.0000 mg | ORAL_CAPSULE | Freq: Two times a day (BID) | ORAL | 0 refills | Status: AC

## 2021-07-24 NOTE — Discharge Instructions (Signed)
Take the antibiotic as prescribed.  Follow-up with your primary provider or ear nose and throat specialist as needed.

## 2021-07-24 NOTE — ED Provider Notes (Signed)
[facility] Emergency Department Provider Note ____________________________________________  Time seen: 1708  I have reviewed the triage vital signs and the nursing notes.  HISTORY  Chief Complaint  Otalgia   HPI Leslie Allison is a 25 y.o. female presents to the ED with complaints of some intermittent left ear pain for the last several weeks.  She presented today after she noted some blood on her pillow as well as some blood at the introitus of her ear.  She reports an intact tympanostomy tube on the left TM, she has been unable to have it removed due to insurance issues.  She denies any purulent drainage from the ear but does endorse some pain along the eustachian tube.  Past Medical History:  Diagnosis Date   Amenorrhea    Asthma    Diabetes mellitus without complication ([place])    Hypothyroidism    PCOS (polycystic ovarian syndrome)    Thyroid disease     Patient Active Problem List   Diagnosis Date Noted   Microalbuminuria due to type 2 diabetes mellitus ([place]) 01/04/2020   Extrinsic asthma 01/04/2020   Seasonal allergic rhinitis due to pollen 01/04/2020   Reactive depression 01/04/2020   Hyperlipidemia associated with type 2 diabetes mellitus ([place]) 05/25/2019   Essential hypertension 05/25/2019   Acquired hypothyroidism 05/25/2019   Elevated transaminase level 05/25/2019   Irregular uterine bleeding 12/21/2018   Abnormal thyroid blood test 12/21/2018   PCO (polycystic ovaries) 12/10/2016   Vaginal discharge 11/21/2016   Amenorrhea 07/18/2016   Morbid obesity with BMI of 60.0-69.9, adult ([place]) 07/18/2016   Mentally challenged 07/18/2016   Family history of breast cancer in mother 07/18/2016    Past Surgical History:  Procedure Laterality Date   NO PAST SURGERIES      Prior to Admission medications   Medication Sig Start Date End Date Taking? Authorizing Provider  cefdinir (OMNICEF) 300 MG capsule Take 1 capsule (300 mg total) by mouth 2  (two) times daily for 7 days. 07/24/21 07/31/21 Yes Edrian Melucci, Dannielle Karvonen, PA-C  albuterol (VENTOLIN HFA) 108 (90 Base) MCG/ACT inhaler Inhale 2 puffs into the lungs every 6 (six) hours as needed for wheezing or shortness of breath. 01/04/20   Towanda Malkin, MD  blood glucose meter kit and supplies KIT Dispense based on patient and insurance preference. Use 1-2 times daily as needed. 05/25/19   Hubbard Hartshorn, FNP  glucose blood test strip Use as instructed 06/16/19   Hubbard Hartshorn, FNP  labetalol (NORMODYNE) 200 MG tablet Take 1 tablet by mouth twice daily 09/29/19   Schuman, Stefanie Libel, MD  levothyroxine (EUTHYROX) 25 MCG tablet TAKE 1 & 1/2 (ONE & ONE-HALF) TABLETS BY MOUTH ONCE DAILY BEFORE BREAKFAST (MUST  COME  FOR  LABS) 01/04/20   Towanda Malkin, MD  lisinopril (ZESTRIL) 10 MG tablet Take 1 tablet (10 mg total) by mouth daily. 01/04/20   Towanda Malkin, MD  metFORMIN (GLUCOPHAGE) 500 MG tablet Take 1 tablet (500 mg total) by mouth 2 (two) times daily with a meal. 04/06/19   Hubbard Hartshorn, FNP  norelgestromin-ethinyl estradiol Marilu Favre) 150-35 MCG/24HR transdermal patch Place 1 patch onto the skin once a week. 02/11/19   Schuman, Stefanie Libel, MD  rosuvastatin (CRESTOR) 10 MG tablet Take 1 tablet (10 mg total) by mouth daily. 08/24/19   Hubbard Hartshorn, FNP    Allergies Amoxicillin  Family History  Problem Relation Age of Onset   Breast cancer Mother    Ovarian  cancer Neg Hx    Colon cancer Neg Hx    Diabetes Neg Hx    Heart disease Neg Hx     Social History Social History   Tobacco Use   Smoking status: Never   Smokeless tobacco: Never  Vaping Use   Vaping Use: Never used  Substance Use Topics   Alcohol use: Not Currently    Comment: occasional wine   Drug use: No    Review of Systems  Constitutional: Negative for fever. Eyes: Negative for visual changes. ENT: Negative for sore throat.  Left otalgia as above Cardiovascular: Negative for chest  pain. Respiratory: Negative for shortness of breath. Gastrointestinal: Negative for abdominal pain, vomiting and diarrhea. Genitourinary: Negative for dysuria. Musculoskeletal: Negative for back pain. Skin: Negative for rash. Neurological: Negative for headaches, focal weakness or numbness. ____________________________________________  PHYSICAL EXAM:  VITAL SIGNS: ED Triage Vitals  Enc Vitals Group     BP 07/24/21 1527 (!) 146/89     Pulse Rate 07/24/21 1527 99     Resp 07/24/21 1527 18     Temp 07/24/21 1527 98.5 F (36.9 C)     Temp Source 07/24/21 1527 Oral     SpO2 07/24/21 1527 96 %     Weight 07/24/21 1534 300 lb (136.1 kg)     Height 07/24/21 1534 5' 2"  (1.575 m)     Head Circumference --      Peak Flow --      Pain Score 07/24/21 1534 3     Pain Loc --      Pain Edu? --      Excl. in [place]? --     Constitutional: Alert and oriented. Well appearing and in no distress. Head: Normocephalic and atraumatic. Eyes: Conjunctivae are normal. PERRL. Normal extraocular movements Ears: Canals clear. TMs intact bilaterally.  Left TM is cloudy with a purulent effusion noted.  There is a tympanostomy tube in place on the left ear.  The canal is clear without maceration or fluid.  No active bleeding or evidence of prior bleeding is noted. Nose: No congestion/rhinorrhea/epistaxis. Mouth/Throat: Mucous membranes are moist. Neck: Supple. No thyromegaly. Hematological/Lymphatic/Immunological: No cervical lymphadenopathy. Cardiovascular: Normal rate, regular rhythm. Normal distal pulses. Respiratory: Normal respiratory effort. No wheezes/rales/rhonchi. Musculoskeletal: Nontender with normal range of motion in all extremities.  Neurologic:  Normal gait without ataxia. Normal speech and language. No gross focal neurologic deficits are appreciated. Skin:  Skin is warm, dry and intact. No rash noted. Psychiatric: Mood and affect are normal. Patient exhibits appropriate insight and  judgment. ____________________________________________    {LABS (pertinent positives/negatives)  ____________________________________________  {EKG  ____________________________________________   RADIOLOGY Official radiology report(s): No results found. ____________________________________________  PROCEDURES   Procedures ____________________________________________   INITIAL IMPRESSION / ASSESSMENT AND PLAN / ED COURSE  As part of my medical decision making, I reviewed the following data within the electronic MEDICAL RECORD NUMBER Notes from prior ED visits   DDX: AOM, otitis externa, TM rupture  Patient ED evaluation of left ear pain and some bleeding noted on the pillow.  Patient evaluated for complaints in ED, found to have an intact tympanostomy tube on the left.  She does have some evidence of a purulent effusion to the left TM.  She be charted empirically on antibiotics at this time.  She will follow-up with [facility] her primary provider for ongoing symptoms peer return precautions of been reviewed.  Leslie Allison was evaluated in Emergency Department on 07/25/2021 for the symptoms described in  the history of present illness. She was evaluated in the context of the global COVID-19 pandemic, which necessitated consideration that the patient might be at risk for infection with the SARS-CoV-2 virus that causes COVID-19. Institutional protocols and algorithms that pertain to the evaluation of patients at risk for COVID-19 are in a state of rapid change based on information released by regulatory bodies including the CDC and federal and state organizations. These policies and algorithms were followed during the patient's care in the ED. ____________________________________________  FINAL CLINICAL IMPRESSION(S) / ED DIAGNOSES  Final diagnoses:  Non-recurrent acute suppurative otitis media of left ear without spontaneous rupture of tympanic membrane      Leslie Allison, Dannielle Karvonen, PA-C 07/25/21 0000    Duffy Bruce, MD 07/26/21 [phone]

## 2021-07-24 NOTE — ED Triage Notes (Signed)
Pt via POV from home. Pt states she woke up from a nap around 02:30pm and saw blood coming out of her L ear. Also c/o ear pain. Pt is A&OX4 and NAD. Denies foreign body use in her L ear. Pt has a hx of ear infection.

## 2022-03-25 ENCOUNTER — Emergency Department: Payer: Medicaid Other

## 2022-03-25 ENCOUNTER — Other Ambulatory Visit: Payer: Self-pay

## 2022-03-25 ENCOUNTER — Emergency Department
Admission: EM | Admit: 2022-03-25 | Discharge: 2022-03-25 | Disposition: A | Discharge status: Home / Self Care | Payer: Medicaid Other | Attending: Emergency Medicine | Admitting: Emergency Medicine

## 2022-03-25 DIAGNOSIS — J45909 Unspecified asthma, uncomplicated: Secondary | ICD-10-CM | POA: Insufficient documentation

## 2022-03-25 DIAGNOSIS — N1330 Unspecified hydronephrosis: Secondary | ICD-10-CM

## 2022-03-25 DIAGNOSIS — N12 Tubulo-interstitial nephritis, not specified as acute or chronic: Secondary | ICD-10-CM

## 2022-03-25 DIAGNOSIS — N132 Hydronephrosis with renal and ureteral calculous obstruction: Secondary | ICD-10-CM | POA: Insufficient documentation

## 2022-03-25 DIAGNOSIS — N200 Calculus of kidney: Secondary | ICD-10-CM

## 2022-03-25 LAB — HEPATIC FUNCTION PANEL
ALT: 38 U/L (ref 0–44)
AST: 26 U/L (ref 15–41)
Albumin: 3.7 g/dL (ref 3.5–5.0)
Alkaline Phosphatase: 62 U/L (ref 38–126)
Bilirubin, Direct: <0.1 mg/dL (ref 0.0–0.2)
Total Bilirubin: 0.7 mg/dL (ref 0.3–1.2)
Total Protein: 7 g/dL (ref 6.5–8.1)

## 2022-03-25 LAB — CBC
HCT: 38.6 % (ref 36.0–46.0)
Hemoglobin: 12.2 g/dL (ref 12.0–15.0)
MCH: 26.9 pg (ref 26.0–34.0)
MCHC: 31.6 g/dL (ref 30.0–36.0)
MCV: 85.2 fL (ref 80.0–100.0)
Platelets: 415 10*3/uL — ABNORMAL HIGH (ref 150–400)
RBC: 4.53 MIL/uL (ref 3.87–5.11)
RDW: 12.7 % (ref 11.5–15.5)
WBC: 9.4 10*3/uL (ref 4.0–10.5)
nRBC: 0 % (ref 0.0–0.2)

## 2022-03-25 LAB — BASIC METABOLIC PANEL
Anion gap: 5 (ref 5–15)
BUN: 11 mg/dL (ref 6–20)
CO2: 30 mmol/L (ref 22–32)
Calcium: 8.6 mg/dL — ABNORMAL LOW (ref 8.9–10.3)
Chloride: 105 mmol/L (ref 98–111)
Creatinine, Ser: 0.78 mg/dL (ref 0.44–1.00)
GFR, Estimated: >60 mL/min (ref >60)
Glucose, Bld: 113 mg/dL — ABNORMAL HIGH (ref 70–99)
Potassium: 3.6 mmol/L (ref 3.5–5.1)
Sodium: 140 mmol/L (ref 135–145)

## 2022-03-25 LAB — URINALYSIS, ROUTINE W REFLEX MICROSCOPIC
Bilirubin Urine: NEGATIVE
Glucose, UA: NEGATIVE mg/dL
Ketones, ur: NEGATIVE mg/dL
Nitrite: NEGATIVE
Protein, ur: 100 mg/dL — AB
RBC / HPF: >50 RBC/hpf — ABNORMAL HIGH (ref 0–5)
Specific Gravity, Urine: 1.014 (ref 1.005–1.030)
Squamous Epithelial / HPF: NONE SEEN (ref 0–5)
WBC, UA: >50 WBC/hpf — ABNORMAL HIGH (ref 0–5)
pH: 7 (ref 5.0–8.0)

## 2022-03-25 LAB — LIPASE, BLOOD: Lipase: 29 U/L (ref 11–51)

## 2022-03-25 LAB — POC URINE PREG, ED: Preg Test, Ur: NEGATIVE

## 2022-03-25 MED ORDER — ONDANSETRON HCL 4 MG/2ML IJ SOLN
4.0000 mg | INTRAMUSCULAR | Status: AC
  Administered 2022-03-25: 4 mg via INTRAVENOUS
  Filled 2022-03-25: qty 2

## 2022-03-25 MED ORDER — LEVOFLOXACIN 750 MG PO TABS: 750.0000 mg | ORAL_TABLET | Freq: Every day | ORAL | 0 refills | Status: DC

## 2022-03-25 MED ORDER — ONDANSETRON 4 MG PO TBDP: 4.0000 mg | ORAL_TABLET | Freq: Four times a day (QID) | ORAL | 0 refills | Status: DC | PRN

## 2022-03-25 MED ORDER — IOHEXOL 300 MG/ML  SOLN
100.0000 mL | Freq: Once | INTRAMUSCULAR | Status: AC | PRN
  Administered 2022-03-25: 100 mL via INTRAVENOUS

## 2022-03-25 MED ORDER — KETOROLAC TROMETHAMINE 30 MG/ML IJ SOLN
15.0000 mg | Freq: Once | INTRAMUSCULAR | Status: AC
  Administered 2022-03-25: 15 mg via INTRAVENOUS
  Filled 2022-03-25: qty 1

## 2022-03-25 MED ORDER — SODIUM CHLORIDE 0.9 % IV BOLUS
1000.0000 mL | Freq: Once | INTRAVENOUS | Status: AC
  Administered 2022-03-25: 1000 mL via INTRAVENOUS

## 2022-03-25 MED ORDER — LEVOFLOXACIN IN D5W 750 MG/150ML IV SOLN
750.0000 mg | Freq: Once | INTRAVENOUS | Status: AC
  Administered 2022-03-25: 750 mg via INTRAVENOUS
  Filled 2022-03-25: qty 150

## 2022-03-25 NOTE — ED Provider Triage Note (Signed)
Emergency Medicine Provider Triage Evaluation Note  Leslie Allison, a 26 y.o. female  was evaluated in triage.  Pt complains of NVD since Friday.  Presents from work today, after she reported weakness.  She has not had much to eat or drink, citing persistent nausea and vomiting.  She presents with stomach pain as well as back pain but denies any fevers, chills, sweats patient denies any dysuria or hematuria.  Review of Systems  Positive: NVD, stomach  Negative: FCS  Physical Exam  BP (!) 150/86 (BP Location: Left Arm)   Pulse 79   Temp 98.9 F (37.2 C) (Oral)   Resp 20   Ht 5\' 2"  (1.575 m)   Wt 108.9 kg   SpO2 97%   BMI 43.90 kg/m  Gen:   Awake, no distress  NAD Resp:  Normal effort CTA MSK:   Moves extremities without difficulty  Other:    Medical Decision Making  Medically screening exam initiated at 3:55 PM.  Appropriate orders placed.  Leslie Allison was informed that the remainder of the evaluation will be completed by another provider, this initial triage assessment does not replace that evaluation, and the importance of remaining in the ED until their evaluation is complete.  Patient to the ED for evaluation of nausea, vomiting, diarrhea, and abdominal pain since Friday.  She denies any fevers.   Leslie Needles, PA-C 03/25/22 1600

## 2022-03-25 NOTE — ED Notes (Signed)
Patient is resting comfortably. 

## 2022-03-25 NOTE — Discharge Instructions (Addendum)
You were seen in the emergency room for abdominal pain. It is important that you follow up closely with her urologist, call Dr. [facility] to schedule an appointment for Thursday.  Please return to the emergency room right away if you are to develop a fever, severe nausea, your pain becomes severe or worsens, you are unable to keep food down, begin vomiting any dark or bloody fluid, you develop any dark or bloody stools, feel dehydrated, or other new concerns or symptoms arise.

## 2022-03-25 NOTE — ED Provider Notes (Signed)
[facility] Provider Note    Event Date/Time   First MD Initiated Contact with Patient 03/25/22 1801     (approximate)   History   Abdominal Pain   HPI  Leslie Allison is a 26 y.o. female   who has a history of asthma diabetes polycystic ovary disease  For about 3 weeks to a month now she has had loose stools up to 4-5 daily, believes it is dietary.  Reports that to be somewhat intermittent longstanding issue for her.  Over the last 2 days though she started developing crampy abdominal pain and its located in her lower abdomen and she has noticed pain and discomfort when she urinates  Today she was out of work and she felt a little bit lightheaded crampy and had a loose stool.  No black or bloody.  Mild nausea no vomiting.  Still eating and drinking mostly fruit and waters and Gatorade at home  No vaginal discharge except she is on her normal menstrual cycle.  Denies pregnancy.  No abnormal vaginal odor or discharge.  Reports when she urinates for the last day or 2 she is also had a burning sensation but also having some pain located in her left lower abdomen that seems to get worse when she has a bowel movement  Denies history of diverticulitis.  Does have a history of frequent urinary tract infections.  No back pain  No fevers      Physical Exam   Triage Vital Signs: ED Triage Vitals  Enc Vitals Group     BP 03/25/22 1544 (!) 150/86     Pulse Rate 03/25/22 1544 79     Resp 03/25/22 1544 20     Temp 03/25/22 1544 98.9 F (37.2 C)     Temp Source 03/25/22 1544 Oral     SpO2 03/25/22 1544 97 %     Weight 03/25/22 1544 240 lb (108.9 kg)     Height 03/25/22 1544  (1.575 m)     Head Circumference --      Peak Flow --      Pain Score 03/25/22 1600 7     Pain Loc --      Pain Edu? --      Excl. in GC? --     Most recent vital signs: Vitals:   03/25/22 1544 03/25/22 2045  BP: (!) 150/86 (!) 149/94  Pulse: 79 72  Resp: 20 20  Temp: 98.9  F (37.2 C)   SpO2: 97% 100%     General: Awake, no distress.  Very pleasant.  Husband also at bedside. CV:  Good peripheral perfusion.  Normal heart tones Resp:  Normal effort.  Clear bilateral Abd:  No distention.  No tenderness noted in the upper abdomen bilateral or right lower quadrant.  Negative for pain McBurney's point.  She reports mild tenderness to palpation in suprapubic region and also a "7 out of 10" discomfort when palpating in the left lower quadrant.  No rebound or guarding.  No evidence of acute peritonitis Other:  Warm well-perfused extremities.  Alert well oriented very amicable.  Mucous membranes slightly dry   ED Results / Procedures / Treatments   Labs (all labs ordered are listed, but only abnormal results are displayed) Labs Reviewed  BASIC METABOLIC PANEL - Abnormal; Notable for the following components:      Result Value   Glucose, Bld 113 (*)    Calcium 8.6 (*)    All other components  within normal limits  URINALYSIS, ROUTINE W REFLEX MICROSCOPIC - Abnormal; Notable for the following components:   Color, Urine PINK (*)    APPearance CLOUDY (*)    Hgb urine dipstick LARGE (*)    Protein, ur 100 (*)    Leukocytes,Ua LARGE (*)    RBC / HPF >50 (*)    WBC, UA >50 (*)    Bacteria, UA RARE (*)    All other components within normal limits  CBC - Abnormal; Notable for the following components:   Platelets 415 (*)    All other components within normal limits  URINE CULTURE  LIPASE, BLOOD  HEPATIC FUNCTION PANEL  POC URINE PREG, ED     EKG     RADIOLOGY   I personally interpreted the patient's CT imaging for gross pathology, and did note what appears to be a large left-sided kidney stone and hydronephrosis.  Defer to radiologist and reviewed more detailed read by radiologist.  I also discussed imaging including CT imaging results with Dr. Apolinar Junes of urology   US PELVIC COMPLETE W TRANSVAGINAL AND TORSION R/O  Result Date: 03/25/2022 CLINICAL  DATA:  Follow-up from today's earlier CT. Complains of left lower quadrant pain, left-sided back pain radiating to the pelvis and painful urination. CT showing dense material at the left UPJ and asymmetric prominence of the left ovary. EXAM: TRANSABDOMINAL AND TRANSVAGINAL ULTRASOUND OF PELVIS DOPPLER ULTRASOUND OF OVARIES TECHNIQUE: Both transabdominal and transvaginal ultrasound examinations of the pelvis were performed. Transabdominal technique was performed for global imaging of the pelvis including uterus, ovaries, adnexal regions, and pelvic cul-de-sac. It was necessary to proceed with endovaginal exam following the transabdominal exam to visualize the ovaries and adnexal spaces. Color and duplex Doppler ultrasound was utilized to evaluate blood flow to the ovaries. COMPARISON:  CT with IV contrast earlier today. FINDINGS: Uterus Measurements: 8.1 x 3.6 x 5.5 cm = volume: 84 mL. No fibroids or other mass visualized. Endometrium Thickness: 5 mm.  No focal abnormality visualized. Right ovary Measurements: 3.9 x 2.0 x 2.7 cm = volume: 11.3 mL. Normal appearance/no adnexal mass. Left ovary Measurements: 2.9 x 2.3 x 2.0 cm = volume: 10 mL. Normal appearance/no adnexal mass. Pulsed Doppler evaluation of both ovaries demonstrates normal low-resistance arterial and venous waveforms. Other findings No abnormal free fluid.  No adnexal mass is seen. IMPRESSION: Negative pelvic ultrasound. Electronically Signed   By: Almira Bar M.D.   On: 03/25/2022 22:02   CT ABDOMEN PELVIS W CONTRAST  Result Date: 03/25/2022 CLINICAL DATA:  Left lower quadrant pain EXAM: CT ABDOMEN AND PELVIS WITH CONTRAST TECHNIQUE: Multidetector CT imaging of the abdomen and pelvis was performed using the standard protocol following bolus administration of intravenous contrast. RADIATION DOSE REDUCTION: This exam was performed according to the departmental dose-optimization program which includes automated exposure control, adjustment of the  mA and/or kV according to patient size and/or use of iterative reconstruction technique. CONTRAST:  OMNIPAQUE IOHEXOL 300 MG/ML  SOLN COMPARISON:  None Available. FINDINGS: Lower chest: Lung bases demonstrate no acute consolidation or effusion. Hepatobiliary: No focal liver abnormality is seen. No gallstones, gallbladder wall thickening, or biliary dilatation. Pancreas: Unremarkable. No pancreatic ductal dilatation or surrounding inflammatory changes. Spleen: Normal in size without focal abnormality. Adrenals/Urinary Tract: Adrenal glands are within normal limits. Large stone in the left renal collecting system measuring 17 by 30 mm. Moderate left hydronephrosis with mild urothelial enhancement. Mild left perinephric fat stranding. Left ureter does not appear to dilated. There is mild  hyperdensity within the proximal left ureter at the level of the UPJ, coronal series 5, image 56. The distal ureter is decompressed. The bladder is normal Stomach/Bowel: Stomach is within normal limits. Appendix appears normal. No evidence of bowel wall thickening, distention, or inflammatory changes. Vascular/Lymphatic: No significant vascular findings are present. Multiple subcentimeter retroperitoneal lymph nodes. Reproductive: Uterus unremarkable. The left ovary is mildly prominent in size and asymmetric to the right. Other: Negative for pelvic effusion or free air. Musculoskeletal: No acute osseous abnormality IMPRESSION: 1. Moderate left hydronephrosis with mild left perinephric stranding and urothelial enhancement of left renal pelvis. Point of obstruction appears to be the UPJ where there is slightly dense soft tissue thickening of the proximal left ureter over a 2.5 cm craniocaudal length, question hemorrhagic material or inflammatory stricture. There is a large 30 mm left kidney stone. Urothelial enhancement at left renal pelvis raises possibility of upper urinary tract infection. 2. Asymmetrically prominent left ovary,  suggest correlation with pelvic ultrasound given history of left lower quadrant pain. Electronically Signed   By: Jasmine PangKim  Fujinaga M.D.   On: 03/25/2022 20:20      CT imaging ordered by me to evaluate for etiology such as diverticulitis, acute colitis, pyelonephritis, etc.  PROCEDURES:  Critical Care performed: No  Procedures   MEDICATIONS ORDERED IN ED: Medications  sodium chloride 0.9 % bolus 1,000 mL (0 mLs Intravenous Stopped 03/25/22 2220)  ondansetron (ZOFRAN) injection 4 mg (4 mg Intravenous Given 03/25/22 1848)  ketorolac (TORADOL) 30 MG/ML injection 15 mg (15 mg Intravenous Given 03/25/22 1848)  iohexol (OMNIPAQUE) 300 MG/ML solution 100 mL (100 mLs Intravenous Contrast Given 03/25/22 2001)  levofloxacin (LEVAQUIN) IVPB 750 mg (0 mg Intravenous Stopped 03/25/22 2220)     IMPRESSION / MDM / ASSESSMENT AND PLAN / ED COURSE  I reviewed the triage vital signs and the nursing notes.                              Differential diagnosis includes but is not limited to, abdominal perforation, aortic dissection, cholecystitis, appendicitis, diverticulitis, colitis, esophagitis/gastritis, kidney stone, pyelonephritis, urinary tract infection, aortic aneurysm. All are considered in decision and treatment plan. Based upon the patient's presentation and risk factors, some suspicion of potential acute colitis or potentially acute diverticulitis, or cystitis.  She does have symptoms of dysuria and urinalysis concerning for UTI.  No clinical signs or symptoms suggestive of acute perforation or obvious acute abdomen but given the location and focality the left lower quadrant pain do wish to rule out diverticulitis.  She denies any obvious gynecologic radiosurgical symptoms other than being on her normal menstrual cycle.  Pain and clinical history does not seem consistent with acute adnexal process or ovarian torsion.  Pregnancy test negative.  On CT imaging there is notable moderate left-sided  hydronephrosis as well as a large left kidney stone in the kidney,, and no noted acute pathology on ultrasound imaging in adnexal region.  No evidence of ovarian torsion     Patient's presentation is most consistent with acute complicated illness / injury requiring diagnostic workup.  The patient is on the cardiac monitor to evaluate for evidence of arrhythmia and/or significant heart rate changes.  Clinical Course as of 03/25/22 2222  Mon Mar 25, 2022  1904 Labs interpreted as normal CBC except for slightly elevated platelet count.  Normal metabolic panel. [MQ]  1904 UTI interpreted as evidence of urinary tract infection. [MQ]  1904 Reports  on her menstrual cycle also some blood noted but patient [MQ]    Clinical Course User Index [MQ] Sharyn Creamer, MD   ----------------------------------------- 10:22 PM on 03/25/2022 ----------------------------------------- The patient's case and clinical history imaging and presentation discussed with our on-call urologist Dr. Apolinar Junes.  Upon review of imaging and work-up, Dr. Apolinar Junes advised that treatment with antibiotic and close urology follow-up would be appropriate.  Dr. Apolinar Junes will have patient scheduled in clinic will call her to set up an appointment on Thursday.  I think this is quite reasonable, patient feels well vital signs normal and has been treated with antibiotic and will continue Levaquin and Zofran.  Levaquin selected as she reports a severe penicillin allergy.  Patient reports to me that amoxicillin causes severe swelling  Urine sent for culture.  Patient without evidence of sepsis.  Will be placed on antibiotic, no hydronephrosis on the right and no evidence of AKI  ----------------------------------------- 10:25 PM on 03/25/2022 ----------------------------------------- Careful return precautions reviewed with the patient who is agreeable with plan for discharge and close follow-up.  Understands careful return precautions which  have been discussed and need for follow-up anticipated Thursday with urology.  Pain, nausea under good control at this time.  Patient comfortable with plan for discharge.  Return precautions and treatment recommendations and follow-up discussed with the patient who is agreeable with the plan.   FINAL CLINICAL IMPRESSION(S) / ED DIAGNOSES   Final diagnoses:  Pyelonephritis of left kidney  Hydronephrosis of left kidney  Kidney stone on left side     Rx / DC Orders   ED Discharge Orders          Ordered    levofloxacin (LEVAQUIN) 750 MG tablet  Daily        03/25/22 2213    ondansetron (ZOFRAN-ODT) 4 MG disintegrating tablet  Every 6 hours PRN        03/25/22 2213    Ambulatory referral to Urology       Comments: Needs appointment for this [facility]   03/25/22 2217             Note:  This document was prepared using Dragon voice recognition software and may include unintentional dictation errors.   Sharyn Creamer, MD 03/25/22 2225

## 2022-03-25 NOTE — ED Triage Notes (Signed)
Patient to ER via [facility] with complaints of generalized abdominal pain, back pain, and diarrhea. Reports symptoms started on Friday. 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 Reports she also has some dysuria and urinary frequency.

## 2022-03-26 LAB — URINE CULTURE: Culture: 50000 — AB

## 2022-03-28 ENCOUNTER — Other Ambulatory Visit: Payer: Self-pay | Admitting: Urology

## 2022-03-28 ENCOUNTER — Ambulatory Visit (INDEPENDENT_AMBULATORY_CARE_PROVIDER_SITE_OTHER): Payer: Self-pay | Admitting: Urology

## 2022-03-28 ENCOUNTER — Encounter: Payer: Self-pay | Admitting: Urology

## 2022-03-28 VITALS — BP 135/94 | HR 85 | Ht 62.0 in | Wt 240.0 lb

## 2022-03-28 DIAGNOSIS — N132 Hydronephrosis with renal and ureteral calculous obstruction: Secondary | ICD-10-CM

## 2022-03-28 DIAGNOSIS — N200 Calculus of kidney: Secondary | ICD-10-CM

## 2022-03-28 DIAGNOSIS — N12 Tubulo-interstitial nephritis, not specified as acute or chronic: Secondary | ICD-10-CM

## 2022-03-28 LAB — URINALYSIS, COMPLETE
Bilirubin, UA: NEGATIVE
Glucose, UA: NEGATIVE
Ketones, UA: NEGATIVE
Nitrite, UA: NEGATIVE
Protein,UA: NEGATIVE
Specific Gravity, UA: 1.025 (ref 1.005–1.030)
Urobilinogen, Ur: 0.2 mg/dL (ref 0.2–1.0)
pH, UA: 6 (ref 5.0–7.5)

## 2022-03-28 LAB — MICROSCOPIC EXAMINATION: WBC, UA: >30 /hpf — AB (ref 0–5)

## 2022-03-28 NOTE — H&P (View-Only) (Signed)
 03/28/22 2:54 PM   Leslie Allison 09/07/1996 [id number]  Referring provider:  Boyce, Emily E, FNP [address] [place],   [address] Chief Complaint  Patient presents with   Nephrolithiasis      HPI: Leslie Allison is a 25 y.o.female who presents today for further evaluation of pyelonephritis of left kidney.   She was seen in the ED on 03/25/2022 she presented with lose stools and crampy abdominal pain that is located in her lower abdomen. Urinalysis showed large Hgb, large leukocytes, >50 RBCs, and >50 WBCs, and rare bacteria. Culture grew S. Agalactiae.   CT abdomen and pelvis with contrast visualized large stone in the left renal collecting system measuring 17 by 30 mm. Moderate left hydronephrosis with mild urothelial enhancement. Mild left perinephric fat stranding. Left ureter does not appear to dilated. There is mild hyperdensity within the proximal left ureter at the level of the UPJ.   She is accompanied by her husband today. She reports that she had abdominal pain and nausea.  He has no lower urinary tract symptoms including no dysuria or gross hematuria.  No fevers or chills.  She is currently on Levaquin.  No previous history of kidney stones.  Her husband mentions today that she drinks primarily Coke and Pepsi.  She knows that this is probably contributing factor.    PMH: Past Medical History:  Diagnosis Date   Amenorrhea    Asthma    Diabetes mellitus without complication (HCC)    Hypothyroidism    PCOS (polycystic ovarian syndrome)    Thyroid disease     Surgical History: Past Surgical History:  Procedure Laterality Date   NO PAST SURGERIES      Home Medications:  Allergies as of 03/28/2022       Reactions   Amoxicillin    Has patient had a PCN reaction causing immediate rash, facial/tongue/throat swelling, SOB or lightheadedness with hypotension: YES Has patient had a PCN reaction causing severe rash involving mucus membranes  or skin necrosis: No Has patient had a PCN reaction that required hospitalization: No Has patient had a PCN reaction occurring within the last 10 years: No If all of the above answers are "NO", then may proceed with Cephalosporin use.        Medication List        Accurate as of March 28, 2022  2:54 PM. If you have any questions, ask your nurse or doctor.          albuterol 108 (90 Base) MCG/ACT inhaler Commonly known as: VENTOLIN HFA Inhale 2 puffs into the lungs every 6 (six) hours as needed for wheezing or shortness of breath.   blood glucose meter kit and supplies Kit Dispense based on patient and insurance preference. Use 1-2 times daily as needed.   glucose blood test strip Use as instructed   labetalol 200 MG tablet Commonly known as: NORMODYNE Take 1 tablet by mouth twice daily   levofloxacin 750 MG tablet Commonly known as: Levaquin Take 1 tablet (750 mg total) by mouth daily.   levothyroxine 25 MCG tablet Commonly known as: Euthyrox TAKE 1 & 1/2 (ONE & ONE-HALF) TABLETS BY MOUTH ONCE DAILY BEFORE BREAKFAST (MUST  COME  FOR  LABS)   lisinopril 10 MG tablet Commonly known as: ZESTRIL Take 1 tablet (10 mg total) by mouth daily.   metFORMIN 500 MG tablet Commonly known as: GLUCOPHAGE Take 1 tablet (500 mg total) by mouth 2 (two) times daily with a meal.     norelgestromin-ethinyl estradiol 150-35 MCG/24HR transdermal patch Commonly known as: Xulane Place 1 patch onto the skin once a week.   ondansetron 4 MG disintegrating tablet Commonly known as: ZOFRAN-ODT Take 1 tablet (4 mg total) by mouth every 6 (six) hours as needed for nausea or vomiting.   rosuvastatin 10 MG tablet Commonly known as: Crestor Take 1 tablet (10 mg total) by mouth daily.        Allergies:  Allergies  Allergen Reactions   Amoxicillin     Has patient had a PCN reaction causing immediate rash, facial/tongue/throat swelling, SOB or lightheadedness with hypotension: YES Has  patient had a PCN reaction causing severe rash involving mucus membranes or skin necrosis: No Has patient had a PCN reaction that required hospitalization: No Has patient had a PCN reaction occurring within the last 10 years: No If all of the above answers are "NO", then may proceed with Cephalosporin use.      Family History: Family History  Problem Relation Age of Onset   Breast cancer Mother    Ovarian cancer Neg Hx    Colon cancer Neg Hx    Diabetes Neg Hx    Heart disease Neg Hx     Social History:  reports that she has never smoked. She has never used smokeless tobacco. She reports that she does not currently use alcohol. She reports that she does not use drugs.   Physical Exam: BP (!) 135/94   Pulse 85   Ht 5' 2" (1.575 m)   Wt 240 lb (108.9 kg)   LMP 03/24/2022   BMI 43.90 kg/m   Constitutional:  Alert and oriented, No acute distress. HEENT: [place] AT, moist mucus membranes.  Trachea midline, no masses. Cardiovascular: No clubbing, cyanosis, or edema. Respiratory: Normal respiratory effort, no increased work of breathing. Skin: No rashes, bruises or suspicious lesions. Neurologic: Grossly intact, no focal deficits, moving all 4 extremities. Psychiatric: Normal mood and affect.  Laboratory Data:  Lab Results  Component Value Date   CREATININE 0.78 03/25/2022   Lab Results  Component Value Date   HGBA1C 6.4 (H) 07/05/2019    Urinalysis Results for orders placed or performed in visit on 03/28/22  Microscopic Examination   Urine  Result Value Ref Range   WBC, UA >30 (A) 0 - 5 /hpf   RBC 11-30 (A) 0 - 2 /hpf   Epithelial Cells (non renal) 0-10 0 - 10 /hpf   Bacteria, UA Many (A) None seen/Few  Urinalysis, Complete  Result Value Ref Range   Specific Gravity, UA 1.025 1.005 - 1.030   pH, UA 6.0 5.0 - 7.5   Color, UA Yellow Yellow   Appearance Ur Cloudy (A) Clear   Leukocytes,UA 2+ (A) Negative   Protein,UA Negative Negative/Trace   Glucose, UA Negative  Negative   Ketones, UA Negative Negative   RBC, UA 3+ (A) Negative   Bilirubin, UA Negative Negative   Urobilinogen, Ur 0.2 0.2 - 1.0 mg/dL   Nitrite, UA Negative Negative   Microscopic Examination See below:      Pertinent Imaging: IMPRESSION: 1. Moderate left hydronephrosis with mild left perinephric stranding and urothelial enhancement of left renal pelvis. Point of obstruction appears to be the UPJ where there is slightly dense soft tissue thickening of the proximal left ureter over a 2.5 cm craniocaudal length, question hemorrhagic material or inflammatory stricture. There is a large 30 mm left kidney stone. Urothelial enhancement at left renal pelvis raises possibility of upper urinary tract infection.   2. Asymmetrically prominent left ovary, suggest correlation with pelvic ultrasound given history of left lower quadrant pain.     Electronically Signed   By: Donavan Foil M.D.   On: 03/25/2022 20:20  The above CT scan was personally reviewed today, agree with radiologic interpretation.  Suspect left renal pelvic enhancement secondary to edema/infection/inflammatory.   Assessment & Plan:    1.  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   We discussed that management is based on stone size, location, density, patient co-morbidities, and patient preference.    SWL has a lower stone free rate in a single procedure, but also a lower complication rate compared to ureteroscopy and avoids a stent and associated stent related symptoms. Possible complications include renal hematoma, steinstrasse, and need for additional treatment. We discussed the role of his increased skin to stone distance can lead to decreased  efficacy with shockwave lithotripsy.   Ureteroscopy with laser lithotripsy and stent placement has a higher stone free rate than SWL in a single procedure, however increased complication rate including possible infection, ureteral injury, bleeding, and stent related morbidity. Common stent related symptoms include dysuria, urgency/frequency, and flank pain.  On the size of the stone, the possibility of needing staged procedure was also reviewed.  Based on her habitus, prefer to avoid PCNL in favor of staged ureteroscopy.   After an extensive discussion of the risks and benefits of the above treatment options, the patient would like to proceed with Ureteroscopy   - Discussed with patient that if they expereince symptoms such as severe pain , nausea and vomiting in the interim  advise to go to the emergency room  - Urine sent for pre-op culture   Conley Rolls as a scribe for Hollice Espy, MD.,have documented all relevant documentation on the behalf of Hollice Espy, MD,as directed by  Hollice Espy, MD while in the presence of Hollice Espy, MD.  I have reviewed the above documentation for accuracy and completeness, and I agree with the above.   Hollice Espy, MD    [facility] [address] [place], [place] [address] [phone]

## 2022-03-28 NOTE — Patient Instructions (Signed)
Ureteroscopy Ureteroscopy is a procedure to check for and treat problems inside part of the urinary tract. In this procedure, a thin, flexible tube with a light at the end (ureteroscope) is used to look at the inside of the kidneys and the ureters. The ureters are the tubes that carry urine from the kidneys to the bladder. The ureteroscope is inserted into one or both of the ureters. You may need this procedure if you have frequent urinary tract infections (UTIs), blood in your urine, or a stone in one of your ureters. A ureteroscopy can be done: To find the cause of urine blockage in a ureter and to evaluate other abnormalities inside the ureters or kidneys. To remove stones. To remove or treat growths of tissue (polyps), abnormal tissue, and some types of tumors. To remove a tissue sample and check it for disease under a microscope (biopsy). Tell a health care provider about: Any allergies you have. All medicines you are taking, including vitamins, herbs, eye drops, creams, and over-the-counter medicines. Any problems you or family members have had with anesthetic medicines. Any blood disorders you have. Any surgeries you have had. Any medical conditions you have. Whether you are pregnant or may be pregnant. What are the risks? Generally, this is a safe procedure. However, problems may occur, including: Bleeding. Infection. Allergic reactions to medicines. Scarring that narrows the ureter (stricture). Creating a hole in the ureter (perforation). What happens before the procedure? Staying hydrated Follow instructions from your health care provider about hydration, which may include: Up to 2 hours before the procedure - you may continue to drink clear liquids, such as water, clear fruit juice, black coffee, and plain tea.  Eating and drinking restrictions Follow instructions from your health care provider about eating and drinking, which may include: 8 hours before the procedure - stop  eating heavy meals or foods, such as meat, fried foods, or fatty foods. 6 hours before the procedure - stop eating light meals or foods, such as toast or cereal. 6 hours before the procedure - stop drinking milk or drinks that contain milk. 2 hours before the procedure - stop drinking clear liquids. Medicines Ask your health care provider about: Changing or stopping your regular medicines. This is especially important if you are taking diabetes medicines or blood thinners. Taking medicines such as aspirin and ibuprofen. These medicines can thin your blood. Do not take these medicines unless your health care provider tells you to take them. Taking over-the-counter medicines, vitamins, herbs, and supplements. General instructions Do not use any products that contain nicotine or tobacco for at least 4 weeks before the procedure. These products include cigarettes, e-cigarettes, and chewing tobacco. If you need help quitting, ask your health care provider. You may have a urine sample taken to check for infection. Plan to have someone take you home from the hospital or clinic. If you will be going home right after the procedure, plan to have someone with you for 24 hours. Ask your health care provider what steps will be taken to help prevent infection. These may include: Washing skin with a germ-killing soap. Receiving antibiotic medicine. What happens during the procedure?  An IV will be inserted into one of your veins. You will be given one or more of the following: A medicine to help you relax (sedative). A medicine to make you fall asleep (general anesthetic). A medicine that is injected into your spine to numb the area below and slightly above the injection site (spinal anesthetic). The   part of your body that drains urine from your bladder (urethra) will be cleaned with a germ-killing solution. The ureteroscope will be passed through your urethra into your bladder. A salt-water solution will  be sent through the ureteroscope to fill your bladder. This will help the health care provider see the openings of your ureters more clearly. The ureteroscope will be passed into your ureter. If a growth is found, a biopsy may be done. If a stone is found, it may be removed through the ureteroscope, or the stone may be broken up using a laser, shock waves, or electrical energy. In some cases, if the ureter is too small, a tube may be inserted that keeps the ureter open (ureteral stent). The stent may be left in place for 1 or 2 weeks to keep the ureter open, and then the ureteroscopy procedure will be done. The scope will be removed, and your bladder will be emptied. The procedure may vary among health care providers and hospitals. What can I expect after the procedure? After your procedure, it is common to have: Your blood pressure, heart rate, breathing rate, and blood oxygen level monitored until you leave the hospital or clinic. A burning sensation when you urinate. You may be asked to urinate. Blood in your urine. Mild discomfort in your bladder area or kidney area when urinating. A need to urinate more often or urgently. Follow these instructions at home: Medicines Take over-the-counter and prescription medicines only as told by your health care provider. If you were prescribed an antibiotic medicine, take it as told by your health care provider. Do not stop taking the antibiotic even if you start to feel better. General instructions  If you were given a sedative during the procedure, it can affect you for several hours. Do not drive or operate machinery until your health care provider says that it is safe. To relieve burning, take a warm bath or hold a warm washcloth over your groin. Drink enough fluid to keep your urine pale yellow. Drink two 8-ounce (237 mL) glasses of water every hour for the first 2 hours after you get home. Continue to drink water often at home. You can eat what  you normally do. Keep all follow-up visits as told by your health care provider. This is important. If you had a ureteral stent placed, ask your health care provider when you need to return to have it removed. Contact a health care provider if you have: Chills or a fever. Burning pain for longer than 24 hours after the procedure. Blood in your urine for longer than 24 hours after the procedure. Get help right away if you have: Large amounts of blood in your urine. Blood clots in your urine. Severe pain. Chest pain or trouble breathing. The feeling of a full bladder and you are unable to urinate. These symptoms may represent a serious problem that is an emergency. Do not wait to see if the symptoms will go away. Get medical help right away. Call your local emergency services (911 in the [place] Summary Ureteroscopy is a procedure to check for and treat problems inside part of the urinary tract. In this procedure, a thin, flexible tube with a light at the end (ureteroscope) is used to look at the inside of the kidneys and the ureters. You may need this procedure if you have frequent urinary tract infections (UTIs), blood in your urine, or a stone in a ureter. This information is not intended to replace advice given to   you by your health care provider. Make sure you discuss any questions you have with your health care provider. Document Revised: 08/30/2021 Document Reviewed: 07/07/2019 Elsevier Patient Education  2023 Elsevier Inc.  

## 2022-03-28 NOTE — Progress Notes (Signed)
03/28/22 2:54 PM   Lowell Bouton 1996-05-14 [id number]  Referring provider:  Hubbard Hartshorn, [place],  [place] [address] Chief Complaint  Patient presents with   Nephrolithiasis      HPI: Leslie Allison is a 26 y.o.female who presents today for further evaluation of pyelonephritis of left kidney.   She was seen in the ED on 03/25/2022 she presented with lose stools and crampy abdominal pain that is located in her lower abdomen. Urinalysis showed large Hgb, large leukocytes, >50 RBCs, and >50 WBCs, and rare bacteria. Culture grew S. Agalactiae.   CT abdomen and pelvis with contrast visualized large stone in the left renal collecting system measuring 17 by 30 mm. Moderate left hydronephrosis with mild urothelial enhancement. Mild left perinephric fat stranding. Left ureter does not appear to dilated. There is mild hyperdensity within the proximal left ureter at the level of the UPJ.   She is accompanied by her husband today. She reports that she had abdominal pain and nausea.  He has no lower urinary tract symptoms including no dysuria or gross hematuria.  No fevers or chills.  She is currently on Levaquin.  No previous history of kidney stones.  Her husband mentions today that she drinks primarily Coke and Pepsi.  She knows that this is probably contributing factor.    PMH: Past Medical History:  Diagnosis Date   Amenorrhea    Asthma    Diabetes mellitus without complication ([place])    Hypothyroidism    PCOS (polycystic ovarian syndrome)    Thyroid disease     Surgical History: Past Surgical History:  Procedure Laterality Date   NO PAST SURGERIES      Home Medications:  Allergies as of 03/28/2022       Reactions   Amoxicillin    Has patient had a PCN reaction causing immediate rash, facial/tongue/throat swelling, SOB or lightheadedness with hypotension: YES Has patient had a PCN reaction causing severe rash involving mucus membranes  or skin necrosis: No Has patient had a PCN reaction that required hospitalization: No Has patient had a PCN reaction occurring within the last 10 years: No If all of the above answers are "NO", then may proceed with Cephalosporin use.        Medication List        Accurate as of March 28, 2022  2:54 PM. If you have any questions, ask your nurse or doctor.          albuterol 108 (90 Base) MCG/ACT inhaler Commonly known as: VENTOLIN HFA Inhale 2 puffs into the lungs every 6 (six) hours as needed for wheezing or shortness of breath.   blood glucose meter kit and supplies Kit Dispense based on patient and insurance preference. Use 1-2 times daily as needed.   glucose blood test strip Use as instructed   labetalol 200 MG tablet Commonly known as: NORMODYNE Take 1 tablet by mouth twice daily   levofloxacin 750 MG tablet Commonly known as: Levaquin Take 1 tablet (750 mg total) by mouth daily.   levothyroxine 25 MCG tablet Commonly known as: Euthyrox TAKE 1 & 1/2 (ONE & ONE-HALF) TABLETS BY MOUTH ONCE DAILY BEFORE BREAKFAST (MUST  COME  FOR  LABS)   lisinopril 10 MG tablet Commonly known as: ZESTRIL Take 1 tablet (10 mg total) by mouth daily.   metFORMIN 500 MG tablet Commonly known as: GLUCOPHAGE Take 1 tablet (500 mg total) by mouth 2 (two) times daily with a meal.  norelgestromin-ethinyl estradiol 150-35 MCG/24HR transdermal patch Commonly known as: Xulane Place 1 patch onto the skin once a week.   ondansetron 4 MG disintegrating tablet Commonly known as: ZOFRAN-ODT Take 1 tablet (4 mg total) by mouth every 6 (six) hours as needed for nausea or vomiting.   rosuvastatin 10 MG tablet Commonly known as: Crestor Take 1 tablet (10 mg total) by mouth daily.        Allergies:  Allergies  Allergen Reactions   Amoxicillin     Has patient had a PCN reaction causing immediate rash, facial/tongue/throat swelling, SOB or lightheadedness with hypotension: YES Has  patient had a PCN reaction causing severe rash involving mucus membranes or skin necrosis: No Has patient had a PCN reaction that required hospitalization: No Has patient had a PCN reaction occurring within the last 10 years: No If all of the above answers are "NO", then may proceed with Cephalosporin use.      Family History: Family History  Problem Relation Age of Onset   Breast cancer Mother    Ovarian cancer Neg Hx    Colon cancer Neg Hx    Diabetes Neg Hx    Heart disease Neg Hx     Social History:  reports that she has never smoked. She has never used smokeless tobacco. She reports that she does not currently use alcohol. She reports that she does not use drugs.   Physical Exam: BP (!) 135/94   Pulse 85   Ht 5' 2" (1.575 m)   Wt 240 lb (108.9 kg)   LMP 03/24/2022   BMI 43.90 kg/m   Constitutional:  Alert and oriented, No acute distress. HEENT: [place] AT, moist mucus membranes.  Trachea midline, no masses. Cardiovascular: No clubbing, cyanosis, or edema. Respiratory: Normal respiratory effort, no increased work of breathing. Skin: No rashes, bruises or suspicious lesions. Neurologic: Grossly intact, no focal deficits, moving all 4 extremities. Psychiatric: Normal mood and affect.  Laboratory Data:  Lab Results  Component Value Date   CREATININE 0.78 03/25/2022   Lab Results  Component Value Date   HGBA1C 6.4 (H) 07/05/2019    Urinalysis Results for orders placed or performed in visit on 03/28/22  Microscopic Examination   Urine  Result Value Ref Range   WBC, UA >30 (A) 0 - 5 /hpf   RBC 11-30 (A) 0 - 2 /hpf   Epithelial Cells (non renal) 0-10 0 - 10 /hpf   Bacteria, UA Many (A) None seen/Few  Urinalysis, Complete  Result Value Ref Range   Specific Gravity, UA 1.025 1.005 - 1.030   pH, UA 6.0 5.0 - 7.5   Color, UA Yellow Yellow   Appearance Ur Cloudy (A) Clear   Leukocytes,UA 2+ (A) Negative   Protein,UA Negative Negative/Trace   Glucose, UA Negative  Negative   Ketones, UA Negative Negative   RBC, UA 3+ (A) Negative   Bilirubin, UA Negative Negative   Urobilinogen, Ur 0.2 0.2 - 1.0 mg/dL   Nitrite, UA Negative Negative   Microscopic Examination See below:      Pertinent Imaging: IMPRESSION: 1. Moderate left hydronephrosis with mild left perinephric stranding and urothelial enhancement of left renal pelvis. Point of obstruction appears to be the UPJ where there is slightly dense soft tissue thickening of the proximal left ureter over a 2.5 cm craniocaudal length, question hemorrhagic material or inflammatory stricture. There is a large 30 mm left kidney stone. Urothelial enhancement at left renal pelvis raises possibility of upper urinary tract infection.  2. Asymmetrically prominent left ovary, suggest correlation with pelvic ultrasound given history of left lower quadrant pain.     Electronically Signed   By: Donavan Foil M.D.   On: 03/25/2022 20:20  The above CT scan was personally reviewed today, agree with radiologic interpretation.  Suspect left renal pelvic enhancement secondary to edema/infection/inflammatory.   Assessment & Plan:    1.  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   We discussed that management is based on stone size, location, density, patient co-morbidities, and patient preference.    SWL has a lower stone free rate in a single procedure, but also a lower complication rate compared to ureteroscopy and avoids a stent and associated stent related symptoms. Possible complications include renal hematoma, steinstrasse, and need for additional treatment. We discussed the role of his increased skin to stone distance can lead to decreased  efficacy with shockwave lithotripsy.   Ureteroscopy with laser lithotripsy and stent placement has a higher stone free rate than SWL in a single procedure, however increased complication rate including possible infection, ureteral injury, bleeding, and stent related morbidity. Common stent related symptoms include dysuria, urgency/frequency, and flank pain.  On the size of the stone, the possibility of needing staged procedure was also reviewed.  Based on her habitus, prefer to avoid PCNL in favor of staged ureteroscopy.   After an extensive discussion of the risks and benefits of the above treatment options, the patient would like to proceed with Ureteroscopy   - Discussed with patient that if they expereince symptoms such as severe pain , nausea and vomiting in the interim  advise to go to the emergency room  - Urine sent for pre-op culture   Conley Rolls as a scribe for Hollice Espy, MD.,have documented all relevant documentation on the behalf of Hollice Espy, MD,as directed by  Hollice Espy, MD while in the presence of Hollice Espy, MD.  I have reviewed the above documentation for accuracy and completeness, and I agree with the above.   Hollice Espy, MD    [facility] [address] [place], [place] [address] [phone]

## 2022-03-28 NOTE — Progress Notes (Signed)
Surgical Physician Order Form [facility]  * Scheduling expectation : Next Available  *Length of Case:   *Clearance needed: no  *Anticoagulation Instructions: Hold all anticoagulants  *Aspirin Instructions: Hold Aspirin  *Post-op visit Date/Instructions:   TBD  *Diagnosis: Left Nephrolithiasis  *Procedure: left Ureteroscopy w/laser lithotripsy & stent placement (28638)   Additional orders: N/A  -Admit type: OUTpatient  -Anesthesia: General  -VTE Prophylaxis Standing Order SCD's       Other:   -Standing Lab Orders Per Anesthesia    Lab other: None  -Standing Test orders EKG/Chest x-ray per Anesthesia       Test other:   - Medications:  Cipro 400mg  IV  -Other orders:  N/A

## 2022-03-29 ENCOUNTER — Telehealth: Payer: Self-pay

## 2022-03-29 NOTE — Progress Notes (Signed)
[facility] Posting Form   Surgery Date/Time: Date: 04/04/2022  Surgeon: Dr. Vanna Scotland, MD  Surgery Location: Day Surgery  Inpt ( No  )   Outpt (Yes)   Obs ( No  )   Diagnosis: N20.0 Left Nephrolithiasis  -CPT: 23536  Surgery: Left Ureteroscopy with laser lithotripsy and stent placement  Stop Anticoagulations: Yes and hold ASA  Cardiac/Medical/Pulmonary Clearance needed: no  *Orders entered into EPIC  Date: 03/29/22   *Case booked in EPIC  Date: 03/28/2022  *Notified pt of Surgery: Date: 03/28/2022  PRE-OP UA & CX: no  *Placed into Prior Authorization Work Que Date: 03/29/22   Assistant/laser/rep:No

## 2022-03-29 NOTE — Telephone Encounter (Signed)
I spoke with Leslie Allison and her Husband Leslie Allison. We have discussed possible surgery dates and Thursday June 22nd, 2023 was agreed upon by all parties. Patient given information about surgery date, what to expect pre-operatively and post operatively.  We discussed that a Pre-Admission Testing office will be calling to set up the pre-op visit that will take place prior to surgery, and that these appointments are typically done over the phone with a Pre-Admissions RN.  Informed patient that our office will communicate any additional care to be provided after surgery. Patients questions or concerns were discussed during our call. Advised to call our office should there be any additional information, questions or concerns that arise. Patient verbalized understanding.

## 2022-04-01 LAB — CULTURE, URINE COMPREHENSIVE

## 2022-04-02 ENCOUNTER — Encounter
Admission: RE | Admit: 2022-04-02 | Discharge: 2022-04-02 | Disposition: A | Discharge status: Home / Self Care | Payer: Medicaid Other | Source: Ambulatory Visit | Attending: Urology | Admitting: Urology

## 2022-04-02 ENCOUNTER — Other Ambulatory Visit: Payer: Self-pay

## 2022-04-02 VITALS — Ht 62.0 in | Wt 240.0 lb

## 2022-04-02 DIAGNOSIS — I10 Essential (primary) hypertension: Secondary | ICD-10-CM

## 2022-04-02 DIAGNOSIS — E119 Type 2 diabetes mellitus without complications: Secondary | ICD-10-CM

## 2022-04-02 DIAGNOSIS — Z01818 Encounter for other preprocedural examination: Secondary | ICD-10-CM

## 2022-04-02 HISTORY — DX: Personal history of urinary calculi: Z87.442

## 2022-04-02 NOTE — Patient Instructions (Addendum)
Your procedure is scheduled on: Thursday 04/04/22 Report to the Registration Desk on the 1st floor of the [facility]. To find out your arrival time, please call [phone] between 1PM - 3PM on: Wednesday 04/03/22 If your arrival time is 6:00 am, do not arrive prior to that time as the [facility] entrance doors do not open until 6:00 am.  REMEMBER: Instructions that are not followed completely may result in serious medical risk, up to and including death; or upon the discretion of your surgeon and anesthesiologist your surgery may need to be rescheduled.  Do not eat or drink after midnight the night before surgery.  No gum chewing, lozengers or hard candies.  TAKE THESE MEDICATIONS THE MORNING OF SURGERY WITH A SIP OF WATER: labetalol (NORMODYNE) 200 MG tablet levothyroxine (EUTHYROX) 25 MCG tablet  Hold metFORMIN (GLUCOPHAGE) 500 MG tablet 2 days prior to surgery. Last dose on today, 04/02/22.  Use your albuterol (VENTOLIN HFA) 108 (90 Base) MCG/ACT inhaler on the day of surgery and bring to the hospital.  One week prior to surgery: Stop Anti-inflammatories (NSAIDS) such as Advil, Aleve, Ibuprofen, Motrin, Naproxen, Naprosyn and Aspirin based products such as Excedrin, Goodys Powder, BC Powder.  Stop ANY OVER THE COUNTER supplements until after surgery.  You may however, continue to take Tylenol if needed for pain up until the day of surgery.  No Alcohol for 24 hours before or after surgery.  No Smoking including e-cigarettes for 24 hours prior to surgery.  No chewable tobacco products for at least 6 hours prior to surgery.  No nicotine patches on the day of surgery.  Do not use any "recreational" drugs for at least a week prior to your surgery.  Please be advised that the combination of cocaine and anesthesia may have negative outcomes, up to and including death. If you test positive for cocaine, your surgery will be cancelled.  On the morning of surgery brush your teeth  with toothpaste and water, you may rinse your mouth with mouthwash if you wish. Do not swallow any toothpaste or mouthwash.  Do not wear jewelry, make-up, hairpins, clips or nail polish.  Do not wear lotions, powders, or perfumes.   Do not shave body from the neck down 48 hours prior to surgery just in case you cut yourself which could leave a site for infection.   Do not bring valuables to the hospital. [facility] is not responsible for any missing/lost belongings or valuables.   Notify your doctor if there is any change in your medical condition (cold, fever, infection).  Wear comfortable clothing (specific to your surgery type) to the hospital.  After surgery, you can help prevent lung complications by doing breathing exercises.  Take deep breaths and cough every 1-2 hours.   If you are being discharged the day of surgery, you will not be allowed to drive home. You will need a responsible adult (18 years or older) to drive you home and stay with you that night.   If you are taking public transportation, you will need to have a responsible adult (18 years or older) with you. Please confirm with your physician that it is acceptable to use public transportation.   Please call the Pre-admissions Testing Dept. at [phone] if you have any questions about these instructions.  Surgery Visitation Policy:  Patients undergoing a surgery or procedure may have two family members or support persons with them as long as the person is not COVID-19 positive or experiencing its  symptoms.   Inpatient Visitation:    Visiting hours are 7 a.m. to 8 p.m. Up to four visitors are allowed at one time in a patient room, including children. The visitors may rotate out with other people during the day. One designated support person (adult) may remain overnight.

## 2022-04-03 ENCOUNTER — Encounter: Payer: Self-pay | Admitting: Urgent Care

## 2022-04-03 ENCOUNTER — Encounter
Admission: RE | Admit: 2022-04-03 | Discharge: 2022-04-03 | Disposition: A | Discharge status: Home / Self Care | Payer: Medicaid Other | Source: Ambulatory Visit | Attending: Urology | Admitting: Urology

## 2022-04-03 DIAGNOSIS — Z01818 Encounter for other preprocedural examination: Secondary | ICD-10-CM | POA: Insufficient documentation

## 2022-04-03 DIAGNOSIS — I10 Essential (primary) hypertension: Secondary | ICD-10-CM | POA: Insufficient documentation

## 2022-04-03 DIAGNOSIS — E119 Type 2 diabetes mellitus without complications: Secondary | ICD-10-CM | POA: Insufficient documentation

## 2022-04-03 LAB — HEMOGLOBIN A1C
Hgb A1c MFr Bld: 6.2 % — ABNORMAL HIGH (ref 4.8–5.6)
Mean Plasma Glucose: 131.24 mg/dL

## 2022-04-03 MED ORDER — FAMOTIDINE 20 MG PO TABS: 20.0000 mg | ORAL_TABLET | Freq: Once | ORAL | Status: AC

## 2022-04-03 MED ORDER — SODIUM CHLORIDE 0.9 % IV SOLN: INTRAVENOUS | Status: DC

## 2022-04-03 MED ORDER — CHLORHEXIDINE GLUCONATE 0.12 % MT SOLN: 15.0000 mL | Freq: Once | OROMUCOSAL | Status: AC

## 2022-04-03 MED ORDER — CIPROFLOXACIN IN D5W 400 MG/200ML IV SOLN
400.0000 mg | INTRAVENOUS | Status: AC
  Administered 2022-04-04: 400 mg via INTRAVENOUS

## 2022-04-03 MED ORDER — ORAL CARE MOUTH RINSE: 15.0000 mL | Freq: Once | OROMUCOSAL | Status: AC

## 2022-04-04 ENCOUNTER — Encounter
Admission: RE | Disposition: A | Discharge status: Home / Self Care | Payer: Self-pay | Source: Ambulatory Visit | Attending: Urology

## 2022-04-04 ENCOUNTER — Encounter: Payer: Self-pay | Admitting: Urology

## 2022-04-04 ENCOUNTER — Ambulatory Visit: Payer: Self-pay | Admitting: Certified Registered"

## 2022-04-04 ENCOUNTER — Ambulatory Visit
Admission: RE | Admit: 2022-04-04 | Discharge: 2022-04-04 | Disposition: A | Discharge status: Home / Self Care | Payer: Self-pay | Source: Ambulatory Visit | Attending: Urology | Admitting: Urology

## 2022-04-04 ENCOUNTER — Ambulatory Visit: Payer: Self-pay

## 2022-04-04 ENCOUNTER — Other Ambulatory Visit: Payer: Self-pay

## 2022-04-04 DIAGNOSIS — N201 Calculus of ureter: Secondary | ICD-10-CM

## 2022-04-04 DIAGNOSIS — E119 Type 2 diabetes mellitus without complications: Secondary | ICD-10-CM | POA: Insufficient documentation

## 2022-04-04 DIAGNOSIS — Z79899 Other long term (current) drug therapy: Secondary | ICD-10-CM | POA: Insufficient documentation

## 2022-04-04 DIAGNOSIS — Z01818 Encounter for other preprocedural examination: Secondary | ICD-10-CM

## 2022-04-04 DIAGNOSIS — E282 Polycystic ovarian syndrome: Secondary | ICD-10-CM | POA: Insufficient documentation

## 2022-04-04 DIAGNOSIS — I10 Essential (primary) hypertension: Secondary | ICD-10-CM | POA: Insufficient documentation

## 2022-04-04 DIAGNOSIS — N135 Crossing vessel and stricture of ureter without hydronephrosis: Secondary | ICD-10-CM

## 2022-04-04 DIAGNOSIS — N200 Calculus of kidney: Secondary | ICD-10-CM

## 2022-04-04 DIAGNOSIS — E039 Hypothyroidism, unspecified: Secondary | ICD-10-CM | POA: Insufficient documentation

## 2022-04-04 DIAGNOSIS — N132 Hydronephrosis with renal and ureteral calculous obstruction: Secondary | ICD-10-CM | POA: Insufficient documentation

## 2022-04-04 DIAGNOSIS — Z6841 Body Mass Index (BMI) 40.0 and over, adult: Secondary | ICD-10-CM | POA: Insufficient documentation

## 2022-04-04 HISTORY — PX: CYSTOSCOPY W/ URETERAL STENT PLACEMENT: SHX1429

## 2022-04-04 LAB — POCT PREGNANCY, URINE: Preg Test, Ur: NEGATIVE

## 2022-04-04 LAB — GLUCOSE, CAPILLARY
Glucose-Capillary: 133 mg/dL — ABNORMAL HIGH (ref 70–99)
Glucose-Capillary: 143 mg/dL — ABNORMAL HIGH (ref 70–99)

## 2022-04-04 SURGERY — CYSTOSCOPY, WITH RETROGRADE PYELOGRAM AND URETERAL STENT INSERTION
Anesthesia: General | Laterality: Left

## 2022-04-04 MED ORDER — IOHEXOL 180 MG/ML  SOLN
INTRAMUSCULAR | Status: DC | PRN
  Administered 2022-04-04: 20 mL

## 2022-04-04 MED ORDER — SUGAMMADEX SODIUM 500 MG/5ML IV SOLN
INTRAVENOUS | Status: DC | PRN
  Administered 2022-04-04: 300 mg via INTRAVENOUS

## 2022-04-04 MED ORDER — FENTANYL CITRATE (PF) 100 MCG/2ML IJ SOLN
INTRAMUSCULAR | Status: DC | PRN
  Administered 2022-04-04: 50 ug via INTRAVENOUS

## 2022-04-04 MED ORDER — ONDANSETRON HCL 4 MG/2ML IJ SOLN
INTRAMUSCULAR | Status: AC
  Filled 2022-04-04: qty 2

## 2022-04-04 MED ORDER — EPHEDRINE SULFATE (PRESSORS) 50 MG/ML IJ SOLN
INTRAMUSCULAR | Status: DC | PRN
  Administered 2022-04-04: 5 mg via INTRAVENOUS

## 2022-04-04 MED ORDER — CIPROFLOXACIN IN D5W 400 MG/200ML IV SOLN
INTRAVENOUS | Status: AC
  Filled 2022-04-04: qty 200

## 2022-04-04 MED ORDER — MIDAZOLAM HCL 2 MG/2ML IJ SOLN
INTRAMUSCULAR | Status: DC | PRN
  Administered 2022-04-04: 2 mg via INTRAVENOUS

## 2022-04-04 MED ORDER — MIDAZOLAM HCL 2 MG/2ML IJ SOLN
INTRAMUSCULAR | Status: AC
  Filled 2022-04-04: qty 2

## 2022-04-04 MED ORDER — DEXAMETHASONE SODIUM PHOSPHATE 10 MG/ML IJ SOLN
INTRAMUSCULAR | Status: DC | PRN
  Administered 2022-04-04: 5 mg via INTRAVENOUS

## 2022-04-04 MED ORDER — FENTANYL CITRATE (PF) 100 MCG/2ML IJ SOLN
INTRAMUSCULAR | Status: AC
  Filled 2022-04-04: qty 2

## 2022-04-04 MED ORDER — OXYBUTYNIN CHLORIDE 5 MG PO TABS: 5.0000 mg | ORAL_TABLET | Freq: Three times a day (TID) | ORAL | 0 refills | Status: AC | PRN

## 2022-04-04 MED ORDER — DEXAMETHASONE SODIUM PHOSPHATE 10 MG/ML IJ SOLN
INTRAMUSCULAR | Status: AC
  Filled 2022-04-04: qty 1

## 2022-04-04 MED ORDER — OXYCODONE-ACETAMINOPHEN 5-325 MG PO TABS: 1.0000 | ORAL_TABLET | ORAL | 0 refills | Status: DC | PRN

## 2022-04-04 MED ORDER — LIDOCAINE HCL (CARDIAC) PF 100 MG/5ML IV SOSY
PREFILLED_SYRINGE | INTRAVENOUS | Status: DC | PRN
  Administered 2022-04-04: 80 mg via INTRAVENOUS

## 2022-04-04 MED ORDER — PHENYLEPHRINE HCL (PRESSORS) 10 MG/ML IV SOLN
INTRAVENOUS | Status: DC | PRN
  Administered 2022-04-04 (×3): 80 ug via INTRAVENOUS

## 2022-04-04 MED ORDER — SODIUM CHLORIDE 0.9 % IR SOLN
Status: DC | PRN
  Administered 2022-04-04: 3000 mL via INTRAVESICAL

## 2022-04-04 MED ORDER — FAMOTIDINE 20 MG PO TABS
ORAL_TABLET | ORAL | Status: AC
  Administered 2022-04-04: 20 mg via ORAL
  Filled 2022-04-04: qty 1

## 2022-04-04 MED ORDER — CHLORHEXIDINE GLUCONATE 0.12 % MT SOLN
OROMUCOSAL | Status: AC
  Administered 2022-04-04: 15 mL via OROMUCOSAL
  Filled 2022-04-04: qty 15

## 2022-04-04 MED ORDER — PROPOFOL 10 MG/ML IV BOLUS
INTRAVENOUS | Status: DC | PRN
  Administered 2022-04-04: 200 mg via INTRAVENOUS

## 2022-04-04 MED ORDER — PROPOFOL 10 MG/ML IV BOLUS
INTRAVENOUS | Status: AC
  Filled 2022-04-04: qty 20

## 2022-04-04 MED ORDER — TAMSULOSIN HCL 0.4 MG PO CAPS: 0.4000 mg | ORAL_CAPSULE | Freq: Every day | ORAL | 0 refills | Status: DC

## 2022-04-04 MED ORDER — ROCURONIUM BROMIDE 100 MG/10ML IV SOLN
INTRAVENOUS | Status: DC | PRN
  Administered 2022-04-04: 60 mg via INTRAVENOUS

## 2022-04-04 SURGICAL SUPPLY — 31 items
ADH LQ OCL WTPRF AMP STRL LF (MISCELLANEOUS)
ADHESIVE MASTISOL STRL (MISCELLANEOUS) IMPLANT
BAG DRAIN CYSTO-URO LG1000N (MISCELLANEOUS) ×2 IMPLANT
BASKET ZERO TIP 1.9FR (BASKET) IMPLANT
BRUSH SCRUB EZ 1% IODOPHOR (MISCELLANEOUS) ×2 IMPLANT
BSKT STON RTRVL ZERO TP 1.9FR (BASKET)
CATH URET FLEX-TIP 2 LUMEN 10F (CATHETERS) ×1 IMPLANT
CATH URETL OPEN 5X70 (CATHETERS) ×2 IMPLANT
CNTNR SPEC 2.5X3XGRAD LEK (MISCELLANEOUS)
CONT SPEC 4OZ STER OR WHT (MISCELLANEOUS)
CONT SPEC 4OZ STRL OR WHT (MISCELLANEOUS)
CONTAINER SPEC 2.5X3XGRAD LEK (MISCELLANEOUS) IMPLANT
DRAPE UTILITY 15X26 TOWEL STRL (DRAPES) ×2 IMPLANT
DRSG TEGADERM 2-3/8X2-3/4 SM (GAUZE/BANDAGES/DRESSINGS) IMPLANT
GLOVE BIO SURGEON STRL SZ 6.5 (GLOVE) ×2 IMPLANT
GOWN STRL REUS W/ TWL LRG LVL3 (GOWN DISPOSABLE) ×2 IMPLANT
GOWN STRL REUS W/TWL LRG LVL3 (GOWN DISPOSABLE) ×4
GUIDEWIRE GREEN .038 145CM (MISCELLANEOUS) ×1 IMPLANT
GUIDEWIRE STR DUAL SENSOR (WIRE) ×3 IMPLANT
IV NS IRRIG 3000ML ARTHROMATIC (IV SOLUTION) ×2 IMPLANT
KIT TURNOVER CYSTO (KITS) ×2 IMPLANT
PACK CYSTO AR (MISCELLANEOUS) ×2 IMPLANT
SET CYSTO W/LG BORE CLAMP LF (SET/KITS/TRAYS/PACK) ×2 IMPLANT
SHEATH NAVIGATOR HD 12/14X36 (SHEATH) ×1 IMPLANT
STENT URET 6FRX22 CONTOUR (STENTS) ×1 IMPLANT
STENT URET 6FRX24 CONTOUR (STENTS) IMPLANT
STENT URET 6FRX26 CONTOUR (STENTS) IMPLANT
SURGILUBE 2OZ TUBE FLIPTOP (MISCELLANEOUS) ×2 IMPLANT
TRACTIP FLEXIVA PULSE ID 200 (Laser) ×1 IMPLANT
WATER STERILE IRR 1000ML POUR (IV SOLUTION) ×1 IMPLANT
WATER STERILE IRR 500ML POUR (IV SOLUTION) ×2 IMPLANT

## 2022-04-04 NOTE — Anesthesia Preprocedure Evaluation (Signed)
Anesthesia Evaluation  Patient identified by MRN, date of birth, ID band Patient awake    Reviewed: Allergy & Precautions, H&P , NPO status , Patient's Chart, lab work & pertinent test results, reviewed documented beta blocker date and time   Airway Mallampati: II  TM Distance: >3 FB Neck ROM: full    Dental  (+) Teeth Intact   Pulmonary asthma ,    Pulmonary exam normal        Cardiovascular Exercise Tolerance: Poor hypertension, On Medications Normal cardiovascular exam Rhythm:regular Rate:Normal     Neuro/Psych PSYCHIATRIC DISORDERS Depression negative neurological ROS     GI/Hepatic negative GI ROS, Neg liver ROS,   Endo/Other  diabetesHypothyroidism Morbid obesity  Renal/GU Renal disease  negative genitourinary   Musculoskeletal   Abdominal   Peds  Hematology negative hematology ROS (+)   Anesthesia Other Findings Past Medical History: No date: Amenorrhea No date: Asthma No date: Diabetes mellitus without complication (HCC) No date: History of kidney stones No date: Hypothyroidism No date: PCOS (polycystic ovarian syndrome) No date: Thyroid disease Past Surgical History: No date: NO PAST SURGERIES   Reproductive/Obstetrics negative OB ROS                             Anesthesia Physical Anesthesia Plan  ASA: 3  Anesthesia Plan: General ETT   Post-op Pain Management:    Induction:   PONV Risk Score and Plan:   Airway Management Planned:   Additional Equipment:   Intra-op Plan:   Post-operative Plan:   Informed Consent: I have reviewed the patients History and Physical, chart, labs and discussed the procedure including the risks, benefits and alternatives for the proposed anesthesia with the patient or authorized representative who has indicated his/her understanding and acceptance.     Dental Advisory Given  Plan Discussed with: CRNA  Anesthesia Plan  Comments:         Anesthesia Quick Evaluation

## 2022-04-04 NOTE — Transfer of Care (Signed)
Immediate Anesthesia Transfer of Care Note  Patient: Leslie Allison  Procedure(s) Performed: CYSTOSCOPY WITH RETROGRADE PYELOGRAM/URETERAL STENT PLACEMENT (Left)  Patient Location: PACU  Anesthesia Type:General  Level of Consciousness: awake, drowsy and patient cooperative  Airway & Oxygen Therapy: Patient Spontanous Breathing and Patient connected to face mask oxygen  Post-op Assessment: Report given to RN and Post -op Vital signs reviewed and stable  Post vital signs: Reviewed and stable  Last Vitals:  Vitals Value Taken Time  BP    Temp    Pulse    Resp    SpO2      Last Pain:  Vitals:   04/04/22 1049  TempSrc: Oral  PainSc: 0-No pain         Complications: No notable events documented.

## 2022-04-04 NOTE — Discharge Instructions (Addendum)
You have a ureteral stent in place.  This is a tube that extends from your kidney to your bladder.  This may cause urinary bleeding, burning with urination, and urinary frequency.  Please call our office or present to the ED if you develop fevers >101 or pain which is not able to be controlled with oral pain medications.  You may be given either Flomax and/ or ditropan to help with bladder spasms and stent pain in addition to pain medications.    [facility] [address], [address] [place], [place] [address] [phone]  AMBULATORY SURGERY  DISCHARGE INSTRUCTIONS   The drugs that you were given will stay in your system until tomorrow so for the next 24 hours you should not:  Drive an automobile Make any legal decisions Drink any alcoholic beverage   You may resume regular meals tomorrow.  Today it is better to start with liquids and gradually work up to solid foods.  You may eat anything you prefer, but it is better to start with liquids, then soup and crackers, and gradually work up to solid foods.   Please notify your doctor immediately if you have any unusual bleeding, trouble breathing, redness and pain at the surgery site, drainage, fever, or pain not relieved by medication.    Additional Instructions:   Please contact your physician with any problems or Same Day Surgery at [phone], Monday through Friday 6 am to 4 pm, or [place] [facility] Main number at [phone].  

## 2022-04-04 NOTE — Anesthesia Procedure Notes (Signed)
Procedure Name: Intubation Date/Time: 04/04/2022 12:13 PM  Performed by: Elmarie Mainland, CRNAPre-anesthesia Checklist: Patient identified, Emergency Drugs available, Suction available and Patient being monitored Patient Re-evaluated:Patient Re-evaluated prior to induction Oxygen Delivery Method: Circle system utilized Preoxygenation: Pre-oxygenation with 100% oxygen Induction Type: IV induction Ventilation: Mask ventilation without difficulty Laryngoscope Size: McGraph and 4 Grade View: Grade I Tube type: Oral Tube size: 7.0 mm Number of attempts: 1 Airway Equipment and Method: Stylet, Oral airway and Video-laryngoscopy Placement Confirmation: ETT inserted through vocal cords under direct vision, positive ETCO2 and breath sounds checked- equal and bilateral Secured at: 22 cm Tube secured with: Tape Dental Injury: Teeth and Oropharynx as per pre-operative assessment

## 2022-04-04 NOTE — Interval H&P Note (Signed)
History and Physical Interval Note:  04/04/2022 11:52 AM  Leslie Allison  has presented today for surgery, with the diagnosis of Left Nephrolithiasis.  The various methods of treatment have been discussed with the patient and family. After consideration of risks, benefits and other options for treatment, the patient has consented to  Procedure(s): CYSTOSCOPY/URETEROSCOPY/HOLMIUM LASER/STENT PLACEMENT (Left) as a surgical intervention.  The patient's history has been reviewed, patient examined, no change in status, stable for surgery.  I have reviewed the patient's chart and labs.  Questions were answered to the patient's satisfaction.    RRR CTAB  Vanna Scotland

## 2022-04-05 ENCOUNTER — Other Ambulatory Visit: Payer: Self-pay | Admitting: Urology

## 2022-04-05 ENCOUNTER — Telehealth: Payer: Self-pay

## 2022-04-05 ENCOUNTER — Encounter: Payer: Self-pay | Admitting: Urology

## 2022-04-05 DIAGNOSIS — N200 Calculus of kidney: Secondary | ICD-10-CM

## 2022-04-05 NOTE — Progress Notes (Addendum)
[facility] Posting Form   Surgery Date/Time: Date: 04/22/2022  Surgeon: Dr. Vanna Scotland, MD  Surgery Location: Day Surgery  Inpt ( No  )   Outpt (Yes)   Obs ( No  )   Diagnosis: N20.0 Left Nephrolithiasis  -CPT: 63875  Surgery: Left Ureteroscopy with laser lithotripsy and stent exchange  Stop Anticoagulations: N/A, may continue ASA  Cardiac/Medical/Pulmonary Clearance needed: no  *Orders entered into EPIC  Date: 04/05/22   *Case booked in [place]  Date: 04/05/22  *Notified pt of Surgery: Date: 04/05/22  PRE-OP UA & CX: yes, will obtain in office on 04/22/2022  *Placed into Prior Authorization Work Angela Nevin Date: 04/05/22   Assistant/laser/rep:No

## 2022-04-17 NOTE — Telephone Encounter (Signed)
Spoke with patient regarding symptoms. Patient has been having dysuria and some hematuria. Explained stent symptoms she has been experiencing are normal. If she has worsening symptoms fever, nausea, unable to void or body aches voiced understanding to contact the office.    ________________________________________________    Also my stent is making me pee a lot and it's annoying and it hurts really bad and I had to miss work because of it. That's also another reason why I need to talk to Dr. Apolinar Junes.     Leslie Allison  [facility] (supporting Vanna Scotland, MD) 2 days ago    Leslie Allison is there anyway I can do a sooner appointment so I can talk to Dr. Apolinar Junes about something. Please let me know

## 2022-04-18 ENCOUNTER — Other Ambulatory Visit
Admission: RE | Admit: 2022-04-18 | Discharge: 2022-04-18 | Disposition: A | Discharge status: Home / Self Care | Payer: Self-pay | Attending: Urology | Admitting: Urology

## 2022-04-18 DIAGNOSIS — N200 Calculus of kidney: Secondary | ICD-10-CM | POA: Insufficient documentation

## 2022-04-18 LAB — URINALYSIS, COMPLETE (UACMP) WITH MICROSCOPIC
Bilirubin Urine: NEGATIVE
Glucose, UA: 50 mg/dL — AB
Ketones, ur: NEGATIVE mg/dL
Nitrite: NEGATIVE
Protein, ur: 30 mg/dL — AB
Specific Gravity, Urine: 1.018 (ref 1.005–1.030)
pH: 5 (ref 5.0–8.0)

## 2022-04-18 NOTE — Addendum Note (Signed)
Addended by: Letta Kocher A on: 04/18/2022 12:06 PM   Modules accepted: Orders

## 2022-04-20 LAB — URINE CULTURE: Culture: >=100000 — AB

## 2022-04-21 MED ORDER — ORAL CARE MOUTH RINSE: 15.0000 mL | Freq: Once | OROMUCOSAL | Status: AC

## 2022-04-21 MED ORDER — CIPROFLOXACIN IN D5W 400 MG/200ML IV SOLN: 400.0000 mg | INTRAVENOUS | Status: AC

## 2022-04-21 MED ORDER — SODIUM CHLORIDE 0.9 % IV SOLN
INTRAVENOUS | Status: DC
Start: 2022-04-21 — End: 2022-04-22

## 2022-04-21 MED ORDER — CHLORHEXIDINE GLUCONATE 0.12 % MT SOLN: 15.0000 mL | Freq: Once | OROMUCOSAL | Status: AC

## 2022-04-22 ENCOUNTER — Ambulatory Visit
Admission: RE | Admit: 2022-04-22 | Discharge: 2022-04-22 | Disposition: A | Discharge status: Home / Self Care | Payer: Self-pay | Source: Ambulatory Visit | Attending: Urology | Admitting: Urology

## 2022-04-22 ENCOUNTER — Other Ambulatory Visit: Payer: Self-pay

## 2022-04-22 ENCOUNTER — Encounter: Payer: Self-pay | Admitting: Urology

## 2022-04-22 ENCOUNTER — Ambulatory Visit: Payer: Self-pay | Admitting: Certified Registered"

## 2022-04-22 ENCOUNTER — Other Ambulatory Visit: Payer: Medicaid Other

## 2022-04-22 ENCOUNTER — Encounter
Admission: RE | Disposition: A | Discharge status: Home / Self Care | Payer: Self-pay | Source: Ambulatory Visit | Attending: Urology

## 2022-04-22 ENCOUNTER — Ambulatory Visit: Payer: Self-pay

## 2022-04-22 DIAGNOSIS — N200 Calculus of kidney: Secondary | ICD-10-CM

## 2022-04-22 DIAGNOSIS — Z419 Encounter for procedure for purposes other than remedying health state, unspecified: Secondary | ICD-10-CM

## 2022-04-22 DIAGNOSIS — N135 Crossing vessel and stricture of ureter without hydronephrosis: Secondary | ICD-10-CM

## 2022-04-22 DIAGNOSIS — N132 Hydronephrosis with renal and ureteral calculous obstruction: Secondary | ICD-10-CM | POA: Insufficient documentation

## 2022-04-22 HISTORY — PX: CYSTOSCOPY/URETEROSCOPY/HOLMIUM LASER/STENT PLACEMENT: SHX6546

## 2022-04-22 LAB — GLUCOSE, CAPILLARY
Glucose-Capillary: 159 mg/dL — ABNORMAL HIGH (ref 70–99)
Glucose-Capillary: 200 mg/dL — ABNORMAL HIGH (ref 70–99)

## 2022-04-22 LAB — POCT PREGNANCY, URINE: Preg Test, Ur: NEGATIVE

## 2022-04-22 SURGERY — CYSTOSCOPY/URETEROSCOPY/HOLMIUM LASER/STENT PLACEMENT
Anesthesia: General | Laterality: Left

## 2022-04-22 MED ORDER — ROCURONIUM BROMIDE 100 MG/10ML IV SOLN
INTRAVENOUS | Status: DC | PRN
  Administered 2022-04-22: 40 mg via INTRAVENOUS
  Administered 2022-04-22 (×2): 10 mg via INTRAVENOUS

## 2022-04-22 MED ORDER — ONDANSETRON HCL 4 MG/2ML IJ SOLN
INTRAMUSCULAR | Status: DC | PRN
  Administered 2022-04-22 (×2): 4 mg via INTRAVENOUS

## 2022-04-22 MED ORDER — OXYCODONE-ACETAMINOPHEN 5-325 MG PO TABS: 1.0000 | ORAL_TABLET | ORAL | 0 refills | Status: DC | PRN

## 2022-04-22 MED ORDER — ACETAMINOPHEN 10 MG/ML IV SOLN
INTRAVENOUS | Status: DC | PRN
  Administered 2022-04-22: 1000 mg via INTRAVENOUS

## 2022-04-22 MED ORDER — CHLORHEXIDINE GLUCONATE 0.12 % MT SOLN
OROMUCOSAL | Status: AC
  Administered 2022-04-22: 15 mL via OROMUCOSAL
  Filled 2022-04-22: qty 15

## 2022-04-22 MED ORDER — MIDAZOLAM HCL 2 MG/2ML IJ SOLN
INTRAMUSCULAR | Status: DC | PRN
  Administered 2022-04-22: 2 mg via INTRAVENOUS

## 2022-04-22 MED ORDER — FENTANYL CITRATE (PF) 100 MCG/2ML IJ SOLN
INTRAMUSCULAR | Status: DC | PRN
Start: 2022-04-22 — End: 2022-04-22
  Administered 2022-04-22: 50 ug via INTRAVENOUS

## 2022-04-22 MED ORDER — GLYCOPYRROLATE 0.2 MG/ML IJ SOLN
INTRAMUSCULAR | Status: DC | PRN
  Administered 2022-04-22: .2 mg via INTRAVENOUS

## 2022-04-22 MED ORDER — MIDAZOLAM HCL 2 MG/2ML IJ SOLN
INTRAMUSCULAR | Status: AC
  Filled 2022-04-22: qty 2

## 2022-04-22 MED ORDER — SUGAMMADEX SODIUM 500 MG/5ML IV SOLN
INTRAVENOUS | Status: DC | PRN
  Administered 2022-04-22: 500 mg via INTRAVENOUS

## 2022-04-22 MED ORDER — ACETAMINOPHEN 10 MG/ML IV SOLN
INTRAVENOUS | Status: AC
  Filled 2022-04-22: qty 100

## 2022-04-22 MED ORDER — OXYCODONE HCL 5 MG PO TABS: 5.0000 mg | ORAL_TABLET | Freq: Once | ORAL | Status: DC | PRN

## 2022-04-22 MED ORDER — FENTANYL CITRATE (PF) 100 MCG/2ML IJ SOLN
INTRAMUSCULAR | Status: AC
  Filled 2022-04-22: qty 2

## 2022-04-22 MED ORDER — EPHEDRINE SULFATE (PRESSORS) 50 MG/ML IJ SOLN: INTRAMUSCULAR | Status: DC | PRN

## 2022-04-22 MED ORDER — CIPROFLOXACIN IN D5W 400 MG/200ML IV SOLN
INTRAVENOUS | Status: AC
  Administered 2022-04-22: 400 mg via INTRAVENOUS
  Filled 2022-04-22: qty 200

## 2022-04-22 MED ORDER — PROPOFOL 10 MG/ML IV BOLUS
INTRAVENOUS | Status: DC | PRN
  Administered 2022-04-22: 150 mg via INTRAVENOUS

## 2022-04-22 MED ORDER — SODIUM CHLORIDE 0.9 % IR SOLN
Status: DC | PRN
  Administered 2022-04-22: 1200 mL

## 2022-04-22 MED ORDER — IOHEXOL 180 MG/ML  SOLN
INTRAMUSCULAR | Status: DC | PRN
  Administered 2022-04-22: 10 mL

## 2022-04-22 MED ORDER — DEXAMETHASONE SODIUM PHOSPHATE 10 MG/ML IJ SOLN
INTRAMUSCULAR | Status: DC | PRN
  Administered 2022-04-22: 5 mg via INTRAVENOUS

## 2022-04-22 MED ORDER — SUCCINYLCHOLINE CHLORIDE 200 MG/10ML IV SOSY
PREFILLED_SYRINGE | INTRAVENOUS | Status: DC | PRN
  Administered 2022-04-22: 160 mg via INTRAVENOUS

## 2022-04-22 MED ORDER — OXYCODONE HCL 5 MG/5ML PO SOLN: 5.0000 mg | Freq: Once | ORAL | Status: DC | PRN

## 2022-04-22 MED ORDER — LIDOCAINE HCL (CARDIAC) PF 100 MG/5ML IV SOSY
PREFILLED_SYRINGE | INTRAVENOUS | Status: DC | PRN
  Administered 2022-04-22: 100 mg via INTRAVENOUS

## 2022-04-22 MED ORDER — PHENYLEPHRINE 80 MCG/ML (10ML) SYRINGE FOR IV PUSH (FOR BLOOD PRESSURE SUPPORT)
PREFILLED_SYRINGE | INTRAVENOUS | Status: DC | PRN
  Administered 2022-04-22: 80 ug via INTRAVENOUS

## 2022-04-22 MED ORDER — FENTANYL CITRATE (PF) 100 MCG/2ML IJ SOLN: 25.0000 ug | INTRAMUSCULAR | Status: DC | PRN

## 2022-04-22 SURGICAL SUPPLY — 32 items
ADH LQ OCL WTPRF AMP STRL LF (MISCELLANEOUS)
ADHESIVE MASTISOL STRL (MISCELLANEOUS) IMPLANT
BAG DRAIN CYSTO-URO LG1000N (MISCELLANEOUS) ×2 IMPLANT
BASKET ZERO TIP 1.9FR (BASKET) IMPLANT
BRUSH SCRUB EZ 1% IODOPHOR (MISCELLANEOUS) ×2 IMPLANT
BSKT STON RTRVL ZERO TP 1.9FR (BASKET)
CATH URET FLEX-TIP 2 LUMEN 10F (CATHETERS) ×1 IMPLANT
CATH URETL OPEN 5X70 (CATHETERS) ×2 IMPLANT
CNTNR SPEC 2.5X3XGRAD LEK (MISCELLANEOUS)
CONT SPEC 4OZ STER OR WHT (MISCELLANEOUS)
CONT SPEC 4OZ STRL OR WHT (MISCELLANEOUS)
CONTAINER SPEC 2.5X3XGRAD LEK (MISCELLANEOUS) IMPLANT
DRAPE UTILITY 15X26 TOWEL STRL (DRAPES) ×2 IMPLANT
DRSG TEGADERM 2-3/8X2-3/4 SM (GAUZE/BANDAGES/DRESSINGS) IMPLANT
FIBER LASER MOSES 200 DFL (Laser) ×1 IMPLANT
GLOVE BIO SURGEON STRL SZ 6.5 (GLOVE) ×3 IMPLANT
GOWN STRL REUS W/ TWL LRG LVL3 (GOWN DISPOSABLE) ×2 IMPLANT
GOWN STRL REUS W/TWL LRG LVL3 (GOWN DISPOSABLE) ×4
GUIDEWIRE GREEN .038 145CM (MISCELLANEOUS) IMPLANT
GUIDEWIRE STR DUAL SENSOR (WIRE) ×2 IMPLANT
IV NS IRRIG 3000ML ARTHROMATIC (IV SOLUTION) ×2 IMPLANT
KIT TURNOVER CYSTO (KITS) ×2 IMPLANT
PACK CYSTO AR (MISCELLANEOUS) ×2 IMPLANT
SET CYSTO W/LG BORE CLAMP LF (SET/KITS/TRAYS/PACK) ×2 IMPLANT
SHEATH NAVIGATOR HD 12/14X36 (SHEATH) ×1 IMPLANT
STENT URET 6FRX22 CONTOUR (STENTS) ×1 IMPLANT
STENT URET 6FRX24 CONTOUR (STENTS) IMPLANT
STENT URET 6FRX26 CONTOUR (STENTS) IMPLANT
SURGILUBE 2OZ TUBE FLIPTOP (MISCELLANEOUS) ×2 IMPLANT
TRACTIP FLEXIVA PULSE ID 200 (Laser) ×2 IMPLANT
WATER STERILE IRR 1000ML POUR (IV SOLUTION) ×2 IMPLANT
WATER STERILE IRR 500ML POUR (IV SOLUTION) ×2 IMPLANT

## 2022-04-22 NOTE — Interval H&P Note (Signed)
History and Physical Interval Note:  04/22/2022 8:44 AM  Leslie Allison  has presented today for surgery, with the diagnosis of Left Nephrolithiasis.  The various methods of treatment have been discussed with the patient and family. After consideration of risks, benefits and other options for treatment, the patient has consented to  Procedure(s): CYSTOSCOPY/URETEROSCOPY/HOLMIUM LASER/STENT EXCHANGE (Left) as a surgical intervention.  The patient's history has been reviewed, patient examined, no change in status, stable for surgery.  I have reviewed the patient's chart and labs.  Questions were answered to the patient's satisfaction.    Returns today for staged procedure s/p stent RRR CTAB  Preop UCx mixed flora   Vanna Scotland

## 2022-04-22 NOTE — Transfer of Care (Signed)
Immediate Anesthesia Transfer of Care Note  Patient: Leslie Allison  Procedure(s) Performed: CYSTOSCOPY/URETEROSCOPY/HOLMIUM LASER/STENT EXCHANGE (Left)  Patient Location: PACU  Anesthesia Type:General  Level of Consciousness: awake, drowsy and patient cooperative  Airway & Oxygen Therapy: Patient Spontanous Breathing and Patient connected to face mask oxygen  Post-op Assessment: Report given to RN and Post -op Vital signs reviewed and stable  Post vital signs: Reviewed and stable  Last Vitals:  Vitals Value Taken Time  BP 164/99 04/22/22 1003  Temp 36.3 C 04/22/22 1003  Pulse 95 04/22/22 1007  Resp 23 04/22/22 1007  SpO2 100 % 04/22/22 1007  Vitals shown include unvalidated device data.  Last Pain:  Vitals:   04/22/22 0755  TempSrc: Temporal  PainSc: 0-No pain      Patients Stated Pain Goal: 0 (04/22/22 0755)  Complications: No notable events documented.

## 2022-04-22 NOTE — Anesthesia Postprocedure Evaluation (Signed)
Anesthesia Post Note  Patient: Leslie Allison  Procedure(s) Performed: CYSTOSCOPY/URETEROSCOPY/HOLMIUM LASER/STENT EXCHANGE (Left)  Patient location during evaluation: PACU Anesthesia Type: General Level of consciousness: awake and alert Pain management: pain level controlled Vital Signs Assessment: post-procedure vital signs reviewed and stable Respiratory status: spontaneous breathing, nonlabored ventilation, respiratory function stable and patient connected to nasal cannula oxygen Cardiovascular status: blood pressure returned to baseline and stable Postop Assessment: no apparent nausea or vomiting Anesthetic complications: no   No notable events documented.   Last Vitals:  Vitals:   04/22/22 1015 04/22/22 1024  BP: (!) 168/90 (!) 153/101  Pulse: 97 99  Resp: 13 (!) 22  Temp:    SpO2: 99% 95%    Last Pain:  Vitals:   04/22/22 1024  TempSrc:   PainSc: 0-No pain                 Stephanie Coup

## 2022-04-22 NOTE — Anesthesia Procedure Notes (Signed)
Procedure Name: Intubation Date/Time: 04/22/2022 9:01 AM  Performed by: Mohammed Kindle, CRNAPre-anesthesia Checklist: Patient identified, Emergency Drugs available, Suction available and Patient being monitored Patient Re-evaluated:Patient Re-evaluated prior to induction Oxygen Delivery Method: Circle system utilized Preoxygenation: Pre-oxygenation with 100% oxygen Induction Type: IV induction Ventilation: Mask ventilation without difficulty Laryngoscope Size: McGraph and 3 Grade View: Grade I Tube type: Oral Tube size: 7.0 mm Number of attempts: 1 Airway Equipment and Method: Stylet and Oral airway Placement Confirmation: ETT inserted through vocal cords under direct vision, positive ETCO2, breath sounds checked- equal and bilateral and CO2 detector Secured at: 21 cm Tube secured with: Tape Dental Injury: Teeth and Oropharynx as per pre-operative assessment

## 2022-04-22 NOTE — Anesthesia Preprocedure Evaluation (Addendum)
Anesthesia Evaluation  Patient identified by MRN, date of birth, ID band Patient awake    Reviewed: Allergy & Precautions, NPO status , Patient's Chart, lab work & pertinent test results  Airway Mallampati: IV  TM Distance: >3 FB     Dental  (+) Teeth Intact   Pulmonary asthma ,    breath sounds clear to auscultation       Cardiovascular hypertension, negative cardio ROS   Rhythm:Regular Rate:Normal     Neuro/Psych PSYCHIATRIC DISORDERS Depression negative neurological ROS     GI/Hepatic negative GI ROS, Neg liver ROS,   Endo/Other  diabetesHypothyroidism   Renal/GU Renal disease  negative genitourinary   Musculoskeletal negative musculoskeletal ROS (+)   Abdominal (+) + obese,   Peds negative pediatric ROS (+)  Hematology negative hematology ROS (+)   Anesthesia Other Findings   Reproductive/Obstetrics negative OB ROS                            Anesthesia Physical Anesthesia Plan  ASA: 3  Anesthesia Plan: General ETT   Post-op Pain Management:    Induction: Intravenous  PONV Risk Score and Plan: 3  Airway Management Planned: Oral ETT  Additional Equipment:   Intra-op Plan:   Post-operative Plan: Extubation in OR  Informed Consent: I have reviewed the patients History and Physical, chart, labs and discussed the procedure including the risks, benefits and alternatives for the proposed anesthesia with the patient or authorized representative who has indicated his/her understanding and acceptance.     Dental Advisory Given  Plan Discussed with: Anesthesiologist, CRNA and Surgeon  Anesthesia Plan Comments: (Patient consented for risks of anesthesia including but not limited to:  - adverse reactions to medications - damage to eyes, teeth, lips or other oral mucosa - nerve damage due to positioning  - sore throat or hoarseness - Damage to heart, brain, nerves, lungs,  other parts of body or loss of life  Patient voiced understanding.)        Anesthesia Quick Evaluation

## 2022-04-22 NOTE — Op Note (Signed)
Date of procedure: 04/22/22  Preoperative diagnosis:  Right kidney stone Right proximal ureteral stricture  Postoperative diagnosis:  Same as above  Procedure: Right ureteroscopy with laser lithotripsy Right retrograde pyelogram Right ureteral stent exchange Interpretation of fluoroscopy less than 30 minutes  Surgeon: Vanna Scotland, MD  Anesthesia: General  Complications: None  Intraoperative findings: Persistent right proximal narrowing, utilized railroad technique over wire to introduce scope into renal pelvis.  Majority of the stone, at least 90% or more fragmented however visualization became quite poor and thus we will plan to return for staged procedure.  Stent replaced.  EBL: minimal  Specimens: none  Drains: 6 x 22 Fr JJ ureteral stent on left  Indication: Leslie Allison is a 26 y.o. patient with right proximal ureteral stricture and a large 3 cm stone status post stent who returns today for staged management of her stone.  After reviewing the management options for treatment, she elected to proceed with the above surgical procedure(s). We have discussed the potential benefits and risks of the procedure, side effects of the proposed treatment, the likelihood of the patient achieving the goals of the procedure, and any potential problems that might occur during the procedure or recuperation. Informed consent has been obtained.  Description of procedure:  The patient was taken to the operating room and general anesthesia was induced.  The patient was placed in the dorsal lithotomy position, prepped and draped in the usual sterile fashion, and preoperative antibiotics were administered. A preoperative time-out was performed.   A 21 French scope was advanced per urethra into the bladder.  Attention was turned to the left side where the ureteral stent was seen emanating.  The distal coil of the stent was grasped and brought to level the urethral meatus.  Was then cannulated  using a sensor wire up to the level of the kidney.  The large stone could be seen on scout imaging within the renal pelvis.  A dual-lumen access sheath was then used to introduce a Super Stiff wire up to the level of the kidney.  The sensor wire was snapped in place and excluded as a safety wire.  A 12/14 French ureteral access sheath was advanced to the mid proximal ureter below the level of a previous known stricture which went easily.  The inner cannula and Super Stiff wire were then removed.  A dual-lumen digital flexible ureteroscope was then brought in and advanced up to level the proximal ureter.  Unfortunately, the ureter in this location remained severely narrow persistently despite the ureteral stent.  I was unable to navigate the scope through this independently.  I ended up placing a wire, Super Stiff through the scope into the renal pelvis and was able to use a railroad technique to advance the scope into the kidney.  The large stone was encountered.  A 242 m laser fiber using the dusting settings of 0.3 J and then 60 Hz was used to fragment the stone.  I ended up increasing the power up to 0.6 J.  I was able to fragment at least 90% or more of the stone which was relatively soft over the course of 1+ hours.  Near the end of the procedure, there was so much dust, blood, debris and because of the proximal ureteral stricture, there was not good outflow for irrigation.  Ended up aspirating about 100 cc of bloody debris-filled urine through the scope to decompress the kidney.  Visualization despite this remained poor.  As such, I elected to  return for 1 more procedure in a few weeks once the debris and blood have cleared.  Contrast was injected into the collecting system which showed no contrast extravasation.  The collecting system remained dilated.  I backed the scope through the proximal ureter which remain narrow albeit patent without injury.  There were no obvious masses at this location, likely  inflammatory stricture.  The access sheath was then removed.  A 6 x 22 French double-J ureteral stent was advanced over the wire.  Once the wire was removed, curl was noted both within the renal pelvis as well as within the bladder.  Bladder was then drained.  The patient was then cleaned and dried, repositioned in the supine position, reversed of anesthesia, taken to the PACU in stable condition.  Plan: We will return for one more staged procedure.  Intraoperative findings were discussed with her husband.  Vanna Scotland, M.D.

## 2022-04-22 NOTE — Discharge Instructions (Addendum)
You have a ureteral stent in place.  This is a tube that extends from your kidney to your bladder.  This may cause urinary bleeding, burning with urination, and urinary frequency.  Please call our office or present to the ED if you develop fevers >101 or pain which is not able to be controlled with oral pain medications.  You may be given either Flomax and/ or ditropan to help with bladder spasms and stent pain in addition to pain medications.    [facility] [address], [address] [place], [place] [address] [phone]  AMBULATORY SURGERY  DISCHARGE INSTRUCTIONS   The drugs that you were given will stay in your system until tomorrow so for the next 24 hours you should not:  Drive an automobile Make any legal decisions Drink any alcoholic beverage   You may resume regular meals tomorrow.  Today it is better to start with liquids and gradually work up to solid foods.  You may eat anything you prefer, but it is better to start with liquids, then soup and crackers, and gradually work up to solid foods.   Please notify your doctor immediately if you have any unusual bleeding, trouble breathing, redness and pain at the surgery site, drainage, fever, or pain not relieved by medication.    Additional Instructions:   Please contact your physician with any problems or Same Day Surgery at [phone], Monday through Friday 6 am to 4 pm, or [place] [facility] Main number at [phone].  

## 2022-04-23 ENCOUNTER — Telehealth: Payer: Self-pay

## 2022-04-23 ENCOUNTER — Other Ambulatory Visit: Payer: Self-pay

## 2022-04-23 DIAGNOSIS — N200 Calculus of kidney: Secondary | ICD-10-CM

## 2022-04-23 NOTE — Progress Notes (Signed)
[facility] Posting Form   Surgery Date/Time: Date: 05/06/2022  Surgeon: Dr. Vanna Scotland, MD  Surgery Location: Day Surgery  Inpt ( No  )   Outpt (Yes)   Obs ( No  )   Diagnosis: N20.0 Left Nephrolithiasis  -CPT: 16109  Surgery: Left Ureteroscopy with laser lithotripsy and stent exchange  Stop Anticoagulations: No, may continue ASA   Cardiac/Medical/Pulmonary Clearance needed: no  *Orders entered into EPIC  Date: 04/23/22   *Case booked in EPIC  Date: 04/23/22  *Notified pt of Surgery: Date: 04/23/22  *Placed into Prior Authorization Work Angela Nevin Date: 04/23/22   Assistant/laser/rep:No

## 2022-04-23 NOTE — Telephone Encounter (Signed)
I spoke with Leslie Allison. We have discussed possible surgery dates and Monday July 24th, 2023 was agreed upon by all parties. Patient given information about surgery date, what to expect pre-operatively and post operatively.  We discussed that a Pre-Admission Testing office will be calling to set up the pre-op visit that will take place prior to surgery, and that these appointments are typically done over the phone with a Pre-Admissions RN.  Informed patient that our office will communicate any additional care to be provided after surgery. Patients questions or concerns were discussed during our call. Advised to call our office should there be any additional information, questions or concerns that arise. Patient verbalized understanding.

## 2022-04-24 ENCOUNTER — Other Ambulatory Visit: Payer: Self-pay | Admitting: Urology

## 2022-04-26 ENCOUNTER — Inpatient Hospital Stay: Admission: RE | Admit: 2022-04-26 | Payer: Medicaid Other | Source: Ambulatory Visit

## 2022-04-27 ENCOUNTER — Encounter: Payer: Self-pay | Admitting: Urology

## 2022-04-29 ENCOUNTER — Other Ambulatory Visit: Payer: Medicaid Other

## 2022-05-01 ENCOUNTER — Other Ambulatory Visit: Payer: Self-pay

## 2022-05-01 ENCOUNTER — Encounter
Admission: RE | Admit: 2022-05-01 | Discharge: 2022-05-01 | Disposition: A | Discharge status: Home / Self Care | Payer: Medicaid Other | Source: Ambulatory Visit | Attending: Urology | Admitting: Urology

## 2022-05-01 DIAGNOSIS — E1129 Type 2 diabetes mellitus with other diabetic kidney complication: Secondary | ICD-10-CM

## 2022-05-01 DIAGNOSIS — R809 Proteinuria, unspecified: Secondary | ICD-10-CM

## 2022-05-01 DIAGNOSIS — Z01812 Encounter for preprocedural laboratory examination: Secondary | ICD-10-CM

## 2022-05-01 HISTORY — DX: Essential (primary) hypertension: I10

## 2022-05-01 HISTORY — DX: Chronic kidney disease, unspecified: N18.9

## 2022-05-01 HISTORY — DX: Anxiety disorder, unspecified: F41.9

## 2022-05-01 NOTE — Patient Instructions (Addendum)
Your procedure is scheduled on: 05/06/22 - Monday Report to the Registration Desk on the 1st floor of the [facility]. To find out your arrival time, please call [phone] between 1PM - 3PM on: 05/03/22 - Friday If your arrival time is 6:00 am, do not arrive prior to that time as the [facility] entrance doors do not open until 6:00 am.  REMEMBER: Instructions that are not followed completely may result in serious medical risk, up to and including death; or upon the discretion of your surgeon and anesthesiologist your surgery may need to be rescheduled.  Do not eat food or drink any fluids after midnight the night before surgery.  No gum chewing, lozengers or hard candies.  TAKE ONLY THESE MEDICATIONS THE MORNING OF SURGERY WITH A SIP OF WATER:  - albuterol (VENTOLIN ) - use your inhaler on the morning of surgery and bring it to the hospital. - labetalol (NORMODYNE) - levothyroxine (EUTHYROX)   - Hold metFORMIN (GLUCOPHAGE) 500 MG tablet beginning 05/04/22, may resume the day after surgery.  One week prior to surgery: Stop Anti-inflammatories (NSAIDS) such as Advil, Aleve, Ibuprofen, Motrin, Naproxen, Naprosyn and Aspirin based products such as Excedrin, Goodys Powder, BC Powder.  Stop ANY OVER THE COUNTER supplements until after surgery.  You may take Tylenol if needed for pain up until the day of surgery.  No Alcohol for 24 hours before or after surgery.  No Smoking including e-cigarettes for 24 hours prior to surgery.  No chewable tobacco products for at least 6 hours prior to surgery.  No nicotine patches on the day of surgery.  Do not use any "recreational" drugs for at least a week prior to your surgery.  Please be advised that the combination of cocaine and anesthesia may have negative outcomes, up to and including death. If you test positive for cocaine, your surgery will be cancelled.  On the morning of surgery brush your teeth with toothpaste and water, you may  rinse your mouth with mouthwash if you wish. Do not swallow any toothpaste or mouthwash..  Do not wear jewelry, make-up, hairpins, clips or nail polish.  Do not wear lotions, powders, or perfumes.   Do not shave body from the neck down 48 hours prior to surgery just in case you cut yourself which could leave a site for infection.  Also, freshly shaved skin may become irritated if using the CHG soap.  Contact lenses, hearing aids and dentures may not be worn into surgery.  Do not bring valuables to the hospital. [facility] is not responsible for any missing/lost belongings or valuables.   Notify your doctor if there is any change in your medical condition (cold, fever, infection).  Wear comfortable clothing (specific to your surgery type) to the hospital.  After surgery, you can help prevent lung complications by doing breathing exercises.  Take deep breaths and cough every 1-2 hours. Your doctor may order a device called an Incentive Spirometer to help you take deep breaths. When coughing or sneezing, hold a pillow firmly against your incision with both hands. This is called "splinting." Doing this helps protect your incision. It also decreases belly discomfort.  If you are being admitted to the hospital overnight, leave your suitcase in the car. After surgery it may be brought to your room.  If you are being discharged the day of surgery, you will not be allowed to drive home. You will need a responsible adult (18 years or older) to drive you home and  stay with you that night.   If you are taking public transportation, you will need to have a responsible adult (18 years or older) with you. Please confirm with your physician that it is acceptable to use public transportation.   Please call the Pre-admissions Testing Dept. at [phone] if you have any questions about these instructions.  Surgery Visitation Policy:  Patients undergoing a surgery or procedure may have two family  members or support persons with them as long as the person is not COVID-19 positive or experiencing its symptoms.   Inpatient Visitation:    Visiting hours are 7 a.m. to 8 p.m. Up to four visitors are allowed at one time in a patient room, including children. The visitors may rotate out with other people during the day. One designated support person (adult) may remain overnight.

## 2022-05-06 ENCOUNTER — Ambulatory Visit
Admission: RE | Admit: 2022-05-06 | Discharge: 2022-05-06 | Disposition: A | Discharge status: Home / Self Care | Payer: Self-pay | Source: Ambulatory Visit | Attending: Urology | Admitting: Urology

## 2022-05-06 ENCOUNTER — Other Ambulatory Visit: Payer: Self-pay

## 2022-05-06 ENCOUNTER — Ambulatory Visit: Payer: Self-pay | Admitting: General Practice

## 2022-05-06 ENCOUNTER — Encounter
Admission: RE | Disposition: A | Discharge status: Home / Self Care | Payer: Self-pay | Source: Ambulatory Visit | Attending: Urology

## 2022-05-06 ENCOUNTER — Encounter: Payer: Self-pay | Admitting: Urology

## 2022-05-06 ENCOUNTER — Ambulatory Visit: Payer: Self-pay

## 2022-05-06 DIAGNOSIS — N189 Chronic kidney disease, unspecified: Secondary | ICD-10-CM | POA: Insufficient documentation

## 2022-05-06 DIAGNOSIS — N201 Calculus of ureter: Secondary | ICD-10-CM

## 2022-05-06 DIAGNOSIS — I129 Hypertensive chronic kidney disease with stage 1 through stage 4 chronic kidney disease, or unspecified chronic kidney disease: Secondary | ICD-10-CM | POA: Insufficient documentation

## 2022-05-06 DIAGNOSIS — N132 Hydronephrosis with renal and ureteral calculous obstruction: Secondary | ICD-10-CM | POA: Insufficient documentation

## 2022-05-06 DIAGNOSIS — Z01812 Encounter for preprocedural laboratory examination: Secondary | ICD-10-CM

## 2022-05-06 DIAGNOSIS — Z6841 Body Mass Index (BMI) 40.0 and over, adult: Secondary | ICD-10-CM | POA: Insufficient documentation

## 2022-05-06 DIAGNOSIS — E1129 Type 2 diabetes mellitus with other diabetic kidney complication: Secondary | ICD-10-CM

## 2022-05-06 DIAGNOSIS — E039 Hypothyroidism, unspecified: Secondary | ICD-10-CM | POA: Insufficient documentation

## 2022-05-06 DIAGNOSIS — N200 Calculus of kidney: Secondary | ICD-10-CM

## 2022-05-06 DIAGNOSIS — E1122 Type 2 diabetes mellitus with diabetic chronic kidney disease: Secondary | ICD-10-CM | POA: Insufficient documentation

## 2022-05-06 DIAGNOSIS — J45909 Unspecified asthma, uncomplicated: Secondary | ICD-10-CM | POA: Insufficient documentation

## 2022-05-06 DIAGNOSIS — E282 Polycystic ovarian syndrome: Secondary | ICD-10-CM | POA: Insufficient documentation

## 2022-05-06 HISTORY — PX: CYSTOSCOPY/URETEROSCOPY/HOLMIUM LASER/STENT PLACEMENT: SHX6546

## 2022-05-06 LAB — GLUCOSE, CAPILLARY
Glucose-Capillary: 171 mg/dL — ABNORMAL HIGH (ref 70–99)
Glucose-Capillary: 171 mg/dL — ABNORMAL HIGH (ref 70–99)

## 2022-05-06 LAB — POCT PREGNANCY, URINE: Preg Test, Ur: NEGATIVE

## 2022-05-06 SURGERY — CYSTOSCOPY/URETEROSCOPY/HOLMIUM LASER/STENT PLACEMENT
Anesthesia: General | Site: Ureter | Laterality: Left

## 2022-05-06 MED ORDER — FENTANYL CITRATE (PF) 100 MCG/2ML IJ SOLN
INTRAMUSCULAR | Status: DC | PRN
  Administered 2022-05-06 (×2): 50 ug via INTRAVENOUS

## 2022-05-06 MED ORDER — CHLORHEXIDINE GLUCONATE 0.12 % MT SOLN
OROMUCOSAL | Status: AC
  Administered 2022-05-06: 15 mL via OROMUCOSAL
  Filled 2022-05-06: qty 15

## 2022-05-06 MED ORDER — LACTATED RINGERS IV SOLN: INTRAVENOUS | Status: DC | PRN

## 2022-05-06 MED ORDER — OXYCODONE HCL 5 MG/5ML PO SOLN: 5.0000 mg | Freq: Once | ORAL | Status: DC | PRN

## 2022-05-06 MED ORDER — FENTANYL CITRATE (PF) 100 MCG/2ML IJ SOLN: 25.0000 ug | INTRAMUSCULAR | Status: DC | PRN

## 2022-05-06 MED ORDER — FAMOTIDINE 20 MG PO TABS: 20.0000 mg | ORAL_TABLET | Freq: Once | ORAL | Status: AC

## 2022-05-06 MED ORDER — CHLORHEXIDINE GLUCONATE 0.12 % MT SOLN: 15.0000 mL | Freq: Once | OROMUCOSAL | Status: AC

## 2022-05-06 MED ORDER — OXYCODONE HCL 5 MG PO TABS: 5.0000 mg | ORAL_TABLET | Freq: Once | ORAL | Status: DC | PRN

## 2022-05-06 MED ORDER — ROCURONIUM BROMIDE 10 MG/ML (PF) SYRINGE
PREFILLED_SYRINGE | INTRAVENOUS | Status: AC
  Filled 2022-05-06: qty 10

## 2022-05-06 MED ORDER — DEXAMETHASONE SODIUM PHOSPHATE 10 MG/ML IJ SOLN
INTRAMUSCULAR | Status: DC | PRN
  Administered 2022-05-06: 10 mg via INTRAVENOUS

## 2022-05-06 MED ORDER — DEXMEDETOMIDINE HCL IN NACL 200 MCG/50ML IV SOLN
INTRAVENOUS | Status: DC | PRN
  Administered 2022-05-06 (×2): 10 ug via INTRAVENOUS

## 2022-05-06 MED ORDER — ROCURONIUM BROMIDE 100 MG/10ML IV SOLN
INTRAVENOUS | Status: DC | PRN
  Administered 2022-05-06: 60 mg via INTRAVENOUS
  Administered 2022-05-06: 20 mg via INTRAVENOUS
  Administered 2022-05-06: 10 mg via INTRAVENOUS

## 2022-05-06 MED ORDER — CIPROFLOXACIN IN D5W 400 MG/200ML IV SOLN
INTRAVENOUS | Status: AC
  Filled 2022-05-06: qty 200

## 2022-05-06 MED ORDER — SUGAMMADEX SODIUM 200 MG/2ML IV SOLN
INTRAVENOUS | Status: DC | PRN
  Administered 2022-05-06: 400 mg via INTRAVENOUS

## 2022-05-06 MED ORDER — ONDANSETRON HCL 4 MG/2ML IJ SOLN
INTRAMUSCULAR | Status: DC | PRN
  Administered 2022-05-06: 4 mg via INTRAVENOUS

## 2022-05-06 MED ORDER — MIDAZOLAM HCL 2 MG/2ML IJ SOLN
INTRAMUSCULAR | Status: AC
  Filled 2022-05-06: qty 2

## 2022-05-06 MED ORDER — LIDOCAINE HCL (PF) 2 % IJ SOLN
INTRAMUSCULAR | Status: AC
  Filled 2022-05-06: qty 5

## 2022-05-06 MED ORDER — ONDANSETRON HCL 4 MG/2ML IJ SOLN
INTRAMUSCULAR | Status: AC
  Filled 2022-05-06: qty 2

## 2022-05-06 MED ORDER — FAMOTIDINE 20 MG PO TABS
ORAL_TABLET | ORAL | Status: AC
  Administered 2022-05-06: 20 mg via ORAL
  Filled 2022-05-06: qty 1

## 2022-05-06 MED ORDER — FENTANYL CITRATE (PF) 100 MCG/2ML IJ SOLN
INTRAMUSCULAR | Status: AC
  Filled 2022-05-06: qty 2

## 2022-05-06 MED ORDER — DEXAMETHASONE SODIUM PHOSPHATE 10 MG/ML IJ SOLN
INTRAMUSCULAR | Status: AC
  Filled 2022-05-06: qty 1

## 2022-05-06 MED ORDER — CIPROFLOXACIN IN D5W 400 MG/200ML IV SOLN
400.0000 mg | INTRAVENOUS | Status: AC
  Administered 2022-05-06: 400 mg via INTRAVENOUS

## 2022-05-06 MED ORDER — SODIUM CHLORIDE 0.9 % IR SOLN
Status: DC | PRN
  Administered 2022-05-06: 3000 mL via INTRAVESICAL

## 2022-05-06 MED ORDER — PHENYLEPHRINE HCL (PRESSORS) 10 MG/ML IV SOLN
INTRAVENOUS | Status: AC
  Filled 2022-05-06: qty 1

## 2022-05-06 MED ORDER — PROPOFOL 10 MG/ML IV BOLUS
INTRAVENOUS | Status: AC
  Filled 2022-05-06: qty 20

## 2022-05-06 MED ORDER — PROPOFOL 10 MG/ML IV BOLUS
INTRAVENOUS | Status: DC | PRN
  Administered 2022-05-06: 200 mg via INTRAVENOUS

## 2022-05-06 MED ORDER — LIDOCAINE HCL (CARDIAC) PF 100 MG/5ML IV SOSY
PREFILLED_SYRINGE | INTRAVENOUS | Status: DC | PRN
  Administered 2022-05-06: 80 mg via INTRAVENOUS

## 2022-05-06 MED ORDER — MIDAZOLAM HCL 2 MG/2ML IJ SOLN
INTRAMUSCULAR | Status: DC | PRN
  Administered 2022-05-06: 2 mg via INTRAVENOUS

## 2022-05-06 MED ORDER — ORAL CARE MOUTH RINSE: 15.0000 mL | Freq: Once | OROMUCOSAL | Status: AC

## 2022-05-06 MED ORDER — IOHEXOL 180 MG/ML  SOLN
INTRAMUSCULAR | Status: DC | PRN
  Administered 2022-05-06: 20 mL
  Administered 2022-05-06: 10 mL

## 2022-05-06 MED ORDER — SODIUM CHLORIDE 0.9 % IV SOLN: INTRAVENOUS | Status: DC

## 2022-05-06 SURGICAL SUPPLY — 31 items
ADH LQ OCL WTPRF AMP STRL LF (MISCELLANEOUS)
ADHESIVE MASTISOL STRL (MISCELLANEOUS) IMPLANT
BAG DRAIN CYSTO-URO LG1000N (MISCELLANEOUS) ×2 IMPLANT
BASKET ZERO TIP 1.9FR (BASKET) IMPLANT
BRUSH SCRUB EZ 1% IODOPHOR (MISCELLANEOUS) ×2 IMPLANT
BSKT STON RTRVL ZERO TP 1.9FR (BASKET)
CATH URET FLEX-TIP 2 LUMEN 10F (CATHETERS) ×1 IMPLANT
CATH URETL OPEN 5X70 (CATHETERS) ×2 IMPLANT
CNTNR SPEC 2.5X3XGRAD LEK (MISCELLANEOUS)
CONT SPEC 4OZ STER OR WHT (MISCELLANEOUS)
CONT SPEC 4OZ STRL OR WHT (MISCELLANEOUS)
CONTAINER SPEC 2.5X3XGRAD LEK (MISCELLANEOUS) IMPLANT
DRAPE UTILITY 15X26 TOWEL STRL (DRAPES) ×2 IMPLANT
DRSG TEGADERM 2-3/8X2-3/4 SM (GAUZE/BANDAGES/DRESSINGS) IMPLANT
FIBER LASER MOSES 200 DFL (Laser) ×1 IMPLANT
GLOVE BIO SURGEON STRL SZ 6.5 (GLOVE) ×2 IMPLANT
GOWN STRL REUS W/ TWL LRG LVL3 (GOWN DISPOSABLE) ×2 IMPLANT
GOWN STRL REUS W/TWL LRG LVL3 (GOWN DISPOSABLE) ×4
GUIDEWIRE GREEN .038 145CM (MISCELLANEOUS) ×1 IMPLANT
GUIDEWIRE STR DUAL SENSOR (WIRE) ×2 IMPLANT
IV NS IRRIG 3000ML ARTHROMATIC (IV SOLUTION) ×2 IMPLANT
KIT TURNOVER CYSTO (KITS) ×2 IMPLANT
PACK CYSTO AR (MISCELLANEOUS) ×2 IMPLANT
SET CYSTO W/LG BORE CLAMP LF (SET/KITS/TRAYS/PACK) ×2 IMPLANT
SHEATH NAVIGATOR HD 12/14X36 (SHEATH) ×1 IMPLANT
STENT URET 6FRX24 CONTOUR (STENTS) IMPLANT
STENT URET 6FRX26 CONTOUR (STENTS) IMPLANT
SURGILUBE 2OZ TUBE FLIPTOP (MISCELLANEOUS) ×2 IMPLANT
TRACTIP FLEXIVA PULSE ID 200 (Laser) ×2 IMPLANT
WATER STERILE IRR 1000ML POUR (IV SOLUTION) ×2 IMPLANT
WATER STERILE IRR 500ML POUR (IV SOLUTION) ×2 IMPLANT

## 2022-05-06 NOTE — Anesthesia Procedure Notes (Signed)
Procedure Name: Intubation Date/Time: 05/06/2022 11:24 AM  Performed by: Rodney Booze, CRNAPre-anesthesia Checklist: Patient identified, Emergency Drugs available, Suction available and Patient being monitored Patient Re-evaluated:Patient Re-evaluated prior to induction Oxygen Delivery Method: Circle system utilized Preoxygenation: Pre-oxygenation with 100% oxygen Induction Type: IV induction Ventilation: Mask ventilation without difficulty Laryngoscope Size: McGraph and 3 Tube type: Oral Tube size: 7.0 mm Number of attempts: 1 Airway Equipment and Method: Stylet and Oral airway Placement Confirmation: ETT inserted through vocal cords under direct vision, positive ETCO2 and breath sounds checked- equal and bilateral Secured at: 19 cm Tube secured with: Tape Dental Injury: Teeth and Oropharynx as per pre-operative assessment

## 2022-05-06 NOTE — Anesthesia Postprocedure Evaluation (Signed)
Anesthesia Post Note  Patient: Leslie Allison  Procedure(s) Performed: CYSTOSCOPY/URETEROSCOPY/HOLMIUM LASER/STENT EXCHANGE (Left: Ureter)  Patient location during evaluation: PACU Anesthesia Type: General Level of consciousness: awake and alert Pain management: pain level controlled Vital Signs Assessment: post-procedure vital signs reviewed and stable Respiratory status: spontaneous breathing, nonlabored ventilation, respiratory function stable and patient connected to nasal cannula oxygen Cardiovascular status: blood pressure returned to baseline and stable Postop Assessment: no apparent nausea or vomiting Anesthetic complications: no   No notable events documented.   Last Vitals:  Vitals:   05/06/22 1302 05/06/22 1318  BP: 119/81 123/82  Pulse:  87  Resp: (!) 31 18  Temp: 36.6 C 36.5 C  SpO2: 95% 97%    Last Pain:  Vitals:   05/06/22 1318  TempSrc: Temporal  PainSc: 0-No pain                 Stephanie Coup

## 2022-05-06 NOTE — Transfer of Care (Signed)
Immediate Anesthesia Transfer of Care Note  Patient: Leslie Allison  Procedure(s) Performed: CYSTOSCOPY/URETEROSCOPY/HOLMIUM LASER/STENT EXCHANGE (Left: Ureter)  Patient Location: PACU  Anesthesia Type:General  Level of Consciousness: drowsy  Airway & Oxygen Therapy: Patient Spontanous Breathing and Patient connected to face mask oxygen  Post-op Assessment: Report given to RN and Post -op Vital signs reviewed and stable  Post vital signs: stable  Last Vitals:  Vitals Value Taken Time  BP 133/79 05/06/22 1242  Temp 36.9 C 05/06/22 1242  Pulse 99 05/06/22 1244  Resp 21 05/06/22 1244  SpO2 98 % 05/06/22 1244  Vitals shown include unvalidated device data.  Last Pain:  Vitals:   05/06/22 1242  TempSrc:   PainSc: Asleep         Complications: No notable events documented.

## 2022-05-06 NOTE — H&P (Signed)
05/06/22  RRR CTAB  She returns today for a second stage procedure to clear all residual stone burden.  All questions were answered.  Leslie Allison 02/24/1996 [id number]   Referring provider:  Hubbard Hartshorn, [address] [place],  [place] [address]    Chief Complaint  Patient presents with   Nephrolithiasis          HPI: Leslie Allison is a 26 y.o.female who presents today for further evaluation of pyelonephritis of left kidney.    She was seen in the ED on 03/25/2022 she presented with lose stools and crampy abdominal pain that is located in her lower abdomen. Urinalysis showed large Hgb, large leukocytes, >50 RBCs, and >50 WBCs, and rare bacteria. Culture grew S. Agalactiae.    CT abdomen and pelvis with contrast visualized large stone in the left renal collecting system measuring 17 by 30 mm. Moderate left hydronephrosis with mild urothelial enhancement. Mild left perinephric fat stranding. Left ureter does not appear to dilated. There is mild hyperdensity within the proximal left ureter at the level of the UPJ.    She is accompanied by her husband today. She reports that she had abdominal pain and nausea.  He has no lower urinary tract symptoms including no dysuria or gross hematuria.  No fevers or chills.  She is currently on Levaquin.  No previous history of kidney stones.  Her husband mentions today that she drinks primarily Coke and Pepsi.  She knows that this is probably contributing factor.       PMH:     Past Medical History:  Diagnosis Date   Amenorrhea     Asthma     Diabetes mellitus without complication ([place])     Hypothyroidism     PCOS (polycystic ovarian syndrome)     Thyroid disease        Surgical History:      Past Surgical History:  Procedure Laterality Date   NO PAST SURGERIES          Home Medications:  Allergies as of 03/28/2022         Reactions    Amoxicillin      Has patient had a PCN reaction causing immediate  rash, facial/tongue/throat swelling, SOB or lightheadedness with hypotension: YES Has patient had a PCN reaction causing severe rash involving mucus membranes or skin necrosis: No Has patient had a PCN reaction that required hospitalization: No Has patient had a PCN reaction occurring within the last 10 years: No If all of the above answers are "NO", then may proceed with Cephalosporin use.            Medication List           Accurate as of March 28, 2022  2:54 PM. If you have any questions, ask your nurse or doctor.              albuterol 108 (90 Base) MCG/ACT inhaler Commonly known as: VENTOLIN HFA Inhale 2 puffs into the lungs every 6 (six) hours as needed for wheezing or shortness of breath.    blood glucose meter kit and supplies Kit Dispense based on patient and insurance preference. Use 1-2 times daily as needed.    glucose blood test strip Use as instructed    labetalol 200 MG tablet Commonly known as: NORMODYNE Take 1 tablet by mouth twice daily    levofloxacin 750 MG tablet Commonly known as: Levaquin Take 1 tablet (750 mg total) by mouth daily.  levothyroxine 25 MCG tablet Commonly known as: Euthyrox TAKE 1 & 1/2 (ONE & ONE-HALF) TABLETS BY MOUTH ONCE DAILY BEFORE BREAKFAST (MUST  COME  FOR  LABS)    lisinopril 10 MG tablet Commonly known as: ZESTRIL Take 1 tablet (10 mg total) by mouth daily.    metFORMIN 500 MG tablet Commonly known as: GLUCOPHAGE Take 1 tablet (500 mg total) by mouth 2 (two) times daily with a meal.    norelgestromin-ethinyl estradiol 150-35 MCG/24HR transdermal patch Commonly known as: Xulane Place 1 patch onto the skin once a week.    ondansetron 4 MG disintegrating tablet Commonly known as: ZOFRAN-ODT Take 1 tablet (4 mg total) by mouth every 6 (six) hours as needed for nausea or vomiting.    rosuvastatin 10 MG tablet Commonly known as: Crestor Take 1 tablet (10 mg total) by mouth daily.             Allergies:        Allergies  Allergen Reactions   Amoxicillin        Has patient had a PCN reaction causing immediate rash, facial/tongue/throat swelling, SOB or lightheadedness with hypotension: YES Has patient had a PCN reaction causing severe rash involving mucus membranes or skin necrosis: No Has patient had a PCN reaction that required hospitalization: No Has patient had a PCN reaction occurring within the last 10 years: No If all of the above answers are "NO", then may proceed with Cephalosporin use.          Family History:      Family History  Problem Relation Age of Onset   Breast cancer Mother     Ovarian cancer Neg Hx     Colon cancer Neg Hx     Diabetes Neg Hx     Heart disease Neg Hx        Social History:  reports that she has never smoked. She has never used smokeless tobacco. She reports that she does not currently use alcohol. She reports that she does not use drugs.     Physical Exam: BP (!) 135/94   Pulse 85   Ht 5' 2"  (1.575 m)   Wt 240 lb (108.9 kg)   LMP 03/24/2022   BMI 43.90 kg/m   Constitutional:  Alert and oriented, No acute distress. HEENT: [place] AT, moist mucus membranes.  Trachea midline, no masses. Cardiovascular: No clubbing, cyanosis, or edema. Respiratory: Normal respiratory effort, no increased work of breathing. Skin: No rashes, bruises or suspicious lesions. Neurologic: Grossly intact, no focal deficits, moving all 4 extremities. Psychiatric: Normal mood and affect.   Laboratory Data:   Recent Labs       Lab Results  Component Value Date    CREATININE 0.78 03/25/2022      Recent Labs       Lab Results  Component Value Date    HGBA1C 6.4 (H) 07/05/2019        Urinalysis      Results for orders placed or performed in visit on 03/28/22  Microscopic Examination    Urine  Result Value Ref Range    WBC, UA >30 (A) 0 - 5 /hpf    RBC 11-30 (A) 0 - 2 /hpf    Epithelial Cells (non renal) 0-10 0 - 10 /hpf    Bacteria, UA Many (A) None seen/Few   Urinalysis, Complete  Result Value Ref Range    Specific Gravity, UA 1.025 1.005 - 1.030    pH, UA 6.0 5.0 -  7.5    Color, UA Yellow Yellow    Appearance Ur Cloudy (A) Clear    Leukocytes,UA 2+ (A) Negative    Protein,UA Negative Negative/Trace    Glucose, UA Negative Negative    Ketones, UA Negative Negative    RBC, UA 3+ (A) Negative    Bilirubin, UA Negative Negative    Urobilinogen, Ur 0.2 0.2 - 1.0 mg/dL    Nitrite, UA Negative Negative    Microscopic Examination See below:          Pertinent Imaging: IMPRESSION: 1. Moderate left hydronephrosis with mild left perinephric stranding and urothelial enhancement of left renal pelvis. Point of obstruction appears to be the UPJ where there is slightly dense soft tissue thickening of the proximal left ureter over a 2.5 cm craniocaudal length, question hemorrhagic material or inflammatory stricture. There is a large 30 mm left kidney stone. Urothelial enhancement at left renal pelvis raises possibility of upper urinary tract infection. 2. Asymmetrically prominent left ovary, suggest correlation with pelvic ultrasound given history of left lower quadrant pain.     Electronically Signed   By: Donavan Foil M.D.   On: 03/25/2022 20:20   The above CT scan was personally reviewed today, agree with radiologic interpretation.  Suspect left renal pelvic enhancement secondary to edema/infection/inflammatory.     Assessment & Plan:     1.  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   We discussed that management is based on stone size, location, density, patient co-morbidities, and patient preference.    SWL has a lower stone free rate in a single procedure, but  also a lower complication rate compared to ureteroscopy and avoids a stent and associated stent related symptoms. Possible complications include renal hematoma, steinstrasse, and need for additional treatment. We discussed the role of his increased skin to stone distance can lead to decreased efficacy with shockwave lithotripsy.   Ureteroscopy with laser lithotripsy and stent placement has a higher stone free rate than SWL in a single procedure, however increased complication rate including possible infection, ureteral injury, bleeding, and stent related morbidity. Common stent related symptoms include dysuria, urgency/frequency, and flank pain.  On the size of the stone, the possibility of needing staged procedure was also reviewed.   Based on her habitus, prefer to avoid PCNL in favor of staged ureteroscopy.   After an extensive discussion of the risks and benefits of the above treatment options, the patient would like to proceed with Ureteroscopy    - Discussed with patient that if they expereince symptoms such as severe pain , nausea and vomiting in the interim  advise to go to the emergency room  - Urine sent for pre-op culture    Conley Rolls as a scribe for Hollice Espy, MD.,have documented all relevant documentation on the behalf of Hollice Espy, MD,as directed by  Hollice Espy, MD while in the presence of Hollice Espy, MD.   I have reviewed the above documentation for accuracy and completeness, and I agree with the above.    Hollice Espy, MD       [facility] [address] [place], [place] [address] [phone]

## 2022-05-06 NOTE — Anesthesia Preprocedure Evaluation (Signed)
Anesthesia Evaluation  Patient identified by MRN, date of birth, ID band Patient awake    Reviewed: Allergy & Precautions, NPO status , Patient's Chart, lab work & pertinent test results  Airway Mallampati: IV  TM Distance: >3 FB Neck ROM: full    Dental  (+) Chipped   Pulmonary asthma ,    Pulmonary exam normal        Cardiovascular hypertension, (-) angina(-) CAD, (-) Past MI and (-) CABG negative cardio ROS Normal cardiovascular exam     Neuro/Psych PSYCHIATRIC DISORDERS negative neurological ROS     GI/Hepatic negative GI ROS, Neg liver ROS,   Endo/Other  diabetesMorbid obesity  Renal/GU      Musculoskeletal   Abdominal   Peds  Hematology negative hematology ROS (+)   Anesthesia Other Findings Past Medical History: No date: Amenorrhea No date: Anxiety No date: Asthma No date: Chronic kidney disease No date: Diabetes mellitus without complication (HCC) No date: History of kidney stones No date: Hypertension No date: Hypothyroidism No date: PCOS (polycystic ovarian syndrome) No date: Thyroid disease  Past Surgical History: 04/04/2022: CYSTOSCOPY W/ URETERAL STENT PLACEMENT; Left     Comment:  Procedure: CYSTOSCOPY WITH RETROGRADE PYELOGRAM/URETERAL              STENT PLACEMENT;  Surgeon: Vanna Scotland, MD;                Location: [facility] ORS;  Service: Urology;  Laterality: Left; 04/22/2022: CYSTOSCOPY/URETEROSCOPY/HOLMIUM LASER/STENT PLACEMENT; Left     Comment:  Procedure: CYSTOSCOPY/URETEROSCOPY/HOLMIUM LASER/STENT               EXCHANGE;  Surgeon: Vanna Scotland, MD;  Location: [facility]               ORS;  Service: Urology;  Laterality: Left; No date: NO PAST SURGERIES  BMI    Body Mass Index: 43.90 kg/m      Reproductive/Obstetrics negative OB ROS                             Anesthesia Physical Anesthesia Plan  ASA: 3  Anesthesia Plan: General ETT   Post-op Pain  Management:    Induction: Intravenous  PONV Risk Score and Plan: 3 and Ondansetron and Dexamethasone  Airway Management Planned: Oral ETT  Additional Equipment:   Intra-op Plan:   Post-operative Plan: Extubation in OR  Informed Consent: I have reviewed the patients History and Physical, chart, labs and discussed the procedure including the risks, benefits and alternatives for the proposed anesthesia with the patient or authorized representative who has indicated his/her understanding and acceptance.     Dental Advisory Given  Plan Discussed with: Anesthesiologist, CRNA and Surgeon  Anesthesia Plan Comments: (Patient consented for risks of anesthesia including but not limited to:  - adverse reactions to medications - damage to eyes, teeth, lips or other oral mucosa - nerve damage due to positioning  - sore throat or hoarseness - Damage to heart, brain, nerves, lungs, other parts of body or loss of life  Patient voiced understanding.)        Anesthesia Quick Evaluation

## 2022-05-06 NOTE — Discharge Instructions (Addendum)
You have a ureteral stent in place.  This is a tube that extends from your kidney to your bladder.  This may cause urinary bleeding, burning with urination, and urinary frequency.  Please call our office or present to the ED if you develop fevers >101 or pain which is not able to be controlled with oral pain medications.  You may be given either Flomax and/ or ditropan to help with bladder spasms and stent pain in addition to pain medications.    [facility] [address], [address] [place], [place] [address] [phone]  AMBULATORY SURGERY  DISCHARGE INSTRUCTIONS   The drugs that you were given will stay in your system until tomorrow so for the next 24 hours you should not:  Drive an automobile Make any legal decisions Drink any alcoholic beverage   You may resume regular meals tomorrow.  Today it is better to start with liquids and gradually work up to solid foods.  You may eat anything you prefer, but it is better to start with liquids, then soup and crackers, and gradually work up to solid foods.   Please notify your doctor immediately if you have any unusual bleeding, trouble breathing, redness and pain at the surgery site, drainage, fever, or pain not relieved by medication.    Additional Instructions:   Please contact your physician with any problems or Same Day Surgery at [phone], Monday through Friday 6 am to 4 pm, or [place] [facility] [place] Main number at [phone].  

## 2022-05-06 NOTE — Op Note (Signed)
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  Required railroad technique to traverse.  Calcified soft amorphous clot material throughout the collecting system which was obliterated.  Unable to access 1 lower pole calyx.  Larger 7 French stent placed.  EBL: Minimal  Specimens: None  Drains: 7 x 24 French double-J ureteral stent on left  Indication: Leslie Allison is a 26 y.o. patient with left kidney stones who returns today for a staged procedure to address her residual stone burden.  After reviewing the management options for treatment, she elected to proceed with the above surgical procedure(s). We have discussed the potential benefits and risks of the procedure, side effects of the proposed treatment, the likelihood of the patient achieving the goals of the procedure, and any potential problems that might occur during the procedure or recuperation. Informed consent has been obtained.  Description of procedure:  The patient was taken to the operating room and general anesthesia was induced.  The patient was placed in the dorsal lithotomy position, prepped and draped in the usual sterile fashion, and preoperative antibiotics were administered. A preoperative time-out was performed.   Partial dictation added by Vanna Scotland, MD. Open the Dictate Later In Basket folder in [place] for iOS to finish this dictation.   Vanna Scotland, M.D.

## 2022-05-07 ENCOUNTER — Encounter: Payer: Self-pay | Admitting: Urology

## 2022-05-08 ENCOUNTER — Other Ambulatory Visit: Payer: Self-pay

## 2022-05-08 DIAGNOSIS — N1339 Other hydronephrosis: Secondary | ICD-10-CM

## 2022-05-08 DIAGNOSIS — N200 Calculus of kidney: Secondary | ICD-10-CM

## 2022-05-12 ENCOUNTER — Encounter: Payer: Self-pay | Admitting: Urology

## 2022-05-13 NOTE — Telephone Encounter (Signed)
Please advise if OP note instructions ok to advise?   We will plan to leave the stent for an additional 4 weeks to allow for proximal ureteral dilation.  Ultimately, we will likely plan to remove the stent and follow-up with a renal ultrasound in about 6 to 8 weeks thereafter.  If her hydronephrosis persists, she will ultimately likely need a Lasix renal study to assess for ongoing obstruction.

## 2022-05-14 NOTE — Telephone Encounter (Signed)
Spoke with patient regarding symptoms. Patient's symptoms have improved. Denies needing additional refills. Would like to delay a day-informed Dr. Delana Meyer recommendations. Voiced understanding.     -_____________________________________________   Can you please call her?  I really do not want her stent to come out early but I be willing to do it a day or 2 later to accommodate her schedule.  Also see if she needs any Zofran and make sure she is optimizing Flomax, Myrbetriq, Pyridium, etc.   Vanna Scotland, MD

## 2022-05-21 ENCOUNTER — Other Ambulatory Visit: Payer: Self-pay | Admitting: Urology

## 2022-05-29 ENCOUNTER — Encounter: Payer: Self-pay | Admitting: Urology

## 2022-06-03 ENCOUNTER — Ambulatory Visit
Admission: RE | Admit: 2022-06-03 | Discharge: 2022-06-03 | Disposition: A | Discharge status: Home / Self Care | Payer: Medicaid Other | Source: Ambulatory Visit | Attending: Urology | Admitting: Urology

## 2022-06-03 DIAGNOSIS — N200 Calculus of kidney: Secondary | ICD-10-CM | POA: Insufficient documentation

## 2022-06-03 DIAGNOSIS — N1339 Other hydronephrosis: Secondary | ICD-10-CM | POA: Insufficient documentation

## 2022-06-05 ENCOUNTER — Encounter: Payer: Self-pay | Admitting: *Deleted

## 2022-06-05 ENCOUNTER — Encounter: Payer: Self-pay | Admitting: Urology

## 2022-06-05 ENCOUNTER — Ambulatory Visit (INDEPENDENT_AMBULATORY_CARE_PROVIDER_SITE_OTHER): Payer: Self-pay | Admitting: Urology

## 2022-06-05 VITALS — BP 131/83 | HR 86

## 2022-06-05 DIAGNOSIS — N200 Calculus of kidney: Secondary | ICD-10-CM

## 2022-06-05 LAB — URINALYSIS, COMPLETE
Bilirubin, UA: NEGATIVE
Glucose, UA: NEGATIVE
Ketones, UA: NEGATIVE
Nitrite, UA: NEGATIVE
Specific Gravity, UA: 1.02 (ref 1.005–1.030)
Urobilinogen, Ur: 0.2 mg/dL (ref 0.2–1.0)
pH, UA: 6 (ref 5.0–7.5)

## 2022-06-05 LAB — MICROSCOPIC EXAMINATION
RBC, Urine: >30 /hpf — AB (ref 0–2)
WBC, UA: >30 /hpf — AB (ref 0–5)

## 2022-06-05 MED ORDER — SULFAMETHOXAZOLE-TRIMETHOPRIM 800-160 MG PO TABS
1.0000 | ORAL_TABLET | Freq: Once | ORAL | Status: AC
  Administered 2022-06-05: 1 via ORAL

## 2022-06-05 NOTE — Progress Notes (Signed)
    06/05/2022  CC: stent removal   HPI: Leslie Allison is a 26 y.o. female with the personal history of pyelonephritis who presents today for post-op stent removal.   She underwent a CT, abdomen and pelvis with contrast on 03/25/2022 during ED visit that visualized large stone in the left renal collecting system measuring 17 by 30 mm. There was moderate left hydronephrosis with mild urothelial enhancement. There was mild left perinephric fat stranding. The left ureter did not appear dilated. There was also mild hypodensity within the proximal left ureter at the level of the UPJ.   She is up undergoing 3 separate procedures, first with stent and then 2 additional staged procedures to address her stone.  She had extremely narrow left proximal ureter.  Most recent procedure int he form left ureteroscopy with Laser lithotripsy, retrograde pyelogram, a left ureteral stent exchange on 05/06/2022. Intraoperative findings showed persistently narrow left proximal ureter despite prolonged ureteral stent of unknown etiology, stricture versus UPJ obstruction.Calcified soft amorphous clot material throughout the collecting system which was obliterated.     Vitals:   06/05/22 1047  BP: 131/83  Pulse: 86   NED. A&Ox3.   No respiratory distress   Abd soft, NT, ND Normal phallus with bilateral descended testicles  Cystoscopy/ Stent removal procedure  Patient identification was confirmed, informed consent was obtained, and patient was prepped using Betadine solution.  Lidocaine jelly was administered per urethral meatus.    Preoperative abx where received prior to procedure.    Procedure: - Flexible cystoscope introduced, without any difficulty.   - Thorough search of the bladder revealed:    normal urethral meatus  Stent seen emanating from  left  ureteral orifice, grasped with stent graspers, and removed in entirety.     Moderate debris in bladder limiting visualization.  Post-Procedure: -  Patient tolerated the procedure well  Assessment & Plan   Left renal stone  - S/p ureteroscopy  - Successful stent removal; saved p.o. Bactrim today x1 as precaution - Risk were reviewed. -f/u 8 weeks RUS, if cannot concern for persistent hydronephrosis and urinary obstruction based on intraoperative findings  Tawni Millers as a scribe for Vanna Scotland, MD.,have documented all relevant documentation on the behalf of Vanna Scotland, MD,as directed by  Vanna Scotland, MD while in the presence of Vanna Scotland, MD.  I have reviewed the above documentation for accuracy and completeness, and I agree with the above.   Vanna Scotland, MD

## 2022-06-05 NOTE — Addendum Note (Signed)
Addended by: Martha Clan on: 06/05/2022 02:51 PM   Modules accepted: Orders

## 2022-06-17 ENCOUNTER — Encounter: Payer: Self-pay | Admitting: Urology

## 2022-06-18 NOTE — Telephone Encounter (Signed)
Called patient to discuss symptoms regarding back pain. Patient states she has been having lower back pain. Denies any urinary symptoms or pain.    Per Dr. Apolinar Junes needs to contact PCP for note

## 2022-06-25 ENCOUNTER — Other Ambulatory Visit: Payer: Self-pay | Admitting: Urology

## 2022-07-01 ENCOUNTER — Emergency Department
Admission: EM | Admit: 2022-07-01 | Discharge: 2022-07-01 | Disposition: A | Discharge status: Home / Self Care | Payer: Self-pay | Attending: Emergency Medicine | Admitting: Emergency Medicine

## 2022-07-01 ENCOUNTER — Other Ambulatory Visit: Payer: Self-pay

## 2022-07-01 ENCOUNTER — Emergency Department: Payer: Medicaid Other

## 2022-07-01 DIAGNOSIS — W109XXA Fall (on) (from) unspecified stairs and steps, initial encounter: Secondary | ICD-10-CM | POA: Insufficient documentation

## 2022-07-01 DIAGNOSIS — S39012A Strain of muscle, fascia and tendon of lower back, initial encounter: Secondary | ICD-10-CM | POA: Insufficient documentation

## 2022-07-01 DIAGNOSIS — I1310 Hypertensive heart and chronic kidney disease without heart failure, with stage 1 through stage 4 chronic kidney disease, or unspecified chronic kidney disease: Secondary | ICD-10-CM | POA: Insufficient documentation

## 2022-07-01 DIAGNOSIS — E119 Type 2 diabetes mellitus without complications: Secondary | ICD-10-CM | POA: Insufficient documentation

## 2022-07-01 DIAGNOSIS — N189 Chronic kidney disease, unspecified: Secondary | ICD-10-CM | POA: Insufficient documentation

## 2022-07-01 DIAGNOSIS — S93401A Sprain of unspecified ligament of right ankle, initial encounter: Secondary | ICD-10-CM | POA: Insufficient documentation

## 2022-07-01 NOTE — ED Provider Notes (Signed)
[facility] Provider Note    Event Date/Time   First MD Initiated Contact with Patient 07/01/22 [phone]     (approximate)  History   Chief Complaint: Back Pain  HPI  Leslie Allison is a 26 y.o. female with a past medical history of anxiety, CKD, diabetes, hypertension, presents to the emergency department with right ankle and lower back pain.  According to the patient she was going down the steps yesterday in the rain when she slipped falling down the steps.  Patient states pain in her right ankle although she has been ambulating on the ankle.  She also states some pain in the lower back.  Patient denies any weakness or numbness.    Physical Exam   Triage Vital Signs: ED Triage Vitals  Enc Vitals Group     BP 07/01/22 0823 (!) 148/73     Pulse Rate 07/01/22 0823 93     Resp 07/01/22 0823 19     Temp 07/01/22 0823 98 F (36.7 C)     Temp src --      SpO2 07/01/22 0823 96 %     Weight 07/01/22 0853 240 lb 1.3 oz (108.9 kg)     Height 07/01/22 0853 5\' 2"  (1.575 m)     Head Circumference --      Peak Flow --      Pain Score 07/01/22 0822 9     Pain Loc --      Pain Edu? --      Excl. in [place]? --     Most recent vital signs: Vitals:   07/01/22 0823  BP: (!) 148/73  Pulse: 93  Resp: 19  Temp: 98 F (36.7 C)  SpO2: 96%    General: Awake, no distress.  CV:  Good peripheral perfusion.  Regular rate and rhythm  Resp:  Normal effort.  Equal breath sounds bilaterally.  Abd:  No distention.  Soft, nontender.  No rebound or guarding. Other:  Mild lower lumbar tenderness.  Mostly in the paraspinal muscles.  Patient has mild tenderness to the medial malleolus but no appreciable edema or ecchymosis.   ED Results / Procedures / Treatments    RADIOLOGY  I reviewed the patient's ankle x-ray images.  No sign of fracture on my evaluation. X-ray read as negative by radiology.   MEDICATIONS ORDERED IN ED: Medications - No data to display   IMPRESSION /  MDM / [place] / ED COURSE  I reviewed the triage vital signs and the nursing notes.  Patient's presentation is most consistent with acute illness / injury with system symptoms.  Patient presents emergency department after a fall down steps yesterday.  Patient's main complaint is right ankle pain also experiencing some lower back discomfort.  My examination patient overall appears well, does have medial malleolus tenderness to palpation we will obtain an x-ray of the patient's ankle to evaluate for fracture however highly suspect more of a sprain injury.  Patient has no significant lumbar tenderness the tenderness she does have appears to be more paraspinal more consistent with lumbosacral strain likely from the fall.  I discussed with the patient ibuprofen or Tylenol as needed for discomfort.  Patient agreeable to plan of care.  Patient is x-rays negative.  We will discharge with Tylenol/ibuprofen as needed.  Patient agreeable to plan of care.  FINAL CLINICAL IMPRESSION(S) / ED DIAGNOSES   Ankle sprain Lumbosacral strain    Note:  This document was prepared using Dragon  voice recognition software and may include unintentional dictation errors.   Minna Antis, MD 07/01/22 [phone]

## 2022-07-01 NOTE — ED Triage Notes (Signed)
Pt comes with c/o back pain and left foot pain after trip and fall yesterday. Pt states steps were slippery and she fell. Pt denies any LOC.

## 2022-07-31 ENCOUNTER — Encounter: Payer: Self-pay | Admitting: Urology

## 2022-07-31 ENCOUNTER — Ambulatory Visit: Payer: Medicaid Other | Admitting: Urology

## 2022-09-14 ENCOUNTER — Emergency Department
Admission: EM | Admit: 2022-09-14 | Discharge: 2022-09-15 | Disposition: A | Discharge status: Home / Self Care | Payer: Medicaid Other | Attending: Emergency Medicine | Admitting: Emergency Medicine

## 2022-09-14 ENCOUNTER — Other Ambulatory Visit: Payer: Self-pay

## 2022-09-14 DIAGNOSIS — J069 Acute upper respiratory infection, unspecified: Secondary | ICD-10-CM | POA: Diagnosis not present

## 2022-09-14 DIAGNOSIS — Z20822 Contact with and (suspected) exposure to covid-19: Secondary | ICD-10-CM | POA: Diagnosis not present

## 2022-09-14 DIAGNOSIS — R0981 Nasal congestion: Secondary | ICD-10-CM | POA: Diagnosis present

## 2022-09-14 LAB — RESP PANEL BY RT-PCR (FLU A&B, COVID) ARPGX2
Influenza A by PCR: NEGATIVE
Influenza B by PCR: NEGATIVE
SARS Coronavirus 2 by RT PCR: NEGATIVE

## 2022-09-14 LAB — GROUP A STREP BY PCR: Group A Strep by PCR: NOT DETECTED

## 2022-09-14 MED ORDER — FLUTICASONE PROPIONATE 50 MCG/ACT NA SUSP: 2.0000 | Freq: Every day | NASAL | 0 refills | Status: AC

## 2022-09-14 MED ORDER — PSEUDOEPHEDRINE HCL 60 MG PO TABS: 60.0000 mg | ORAL_TABLET | Freq: Four times a day (QID) | ORAL | 0 refills | Status: AC | PRN

## 2022-09-14 MED ORDER — PSEUDOEPHEDRINE HCL 30 MG PO TABS
60.0000 mg | ORAL_TABLET | Freq: Once | ORAL | Status: AC
  Administered 2022-09-15: 60 mg via ORAL
  Filled 2022-09-14: qty 2

## 2022-09-14 MED ORDER — FLUTICASONE PROPIONATE 50 MCG/ACT NA SUSP
2.0000 | Freq: Once | NASAL | Status: AC
  Administered 2022-09-15: 2 via NASAL
  Filled 2022-09-14: qty 16

## 2022-09-14 NOTE — ED Triage Notes (Signed)
Reports nasal and sinus congestion x 2 days. Denies taking OTC meds at home. Reports can now feel mucus draining down back of throat. Denies sore throat or cough. Denies fevers at home. Pt alert and oriented following commands. Breathing unlabored speaking in full sentences with symmetric chest rise and fall.

## 2022-09-14 NOTE — Discharge Instructions (Addendum)
Take the Sudafed and Flonase as prescribed.  You may take over-the-counter Tylenol or ibuprofen as needed for fever or pain.  Follow-up with your regular doctor.  Return to the ER for any new or worsening symptoms that concern you.

## 2022-09-14 NOTE — ED Provider Notes (Signed)
[facility] Provider Note    Event Date/Time   First MD Initiated Contact with Patient 09/14/22 2327     (approximate)   History   sinus congestion    HPI {Remember to add pertinent medical, surgical, social, and/or OB history to HPI:1} Leslie Allison is a 26 y.o. female  ***history of diabetes and hypertension presents with rhinorrhea, nasal congestion, and postnasal drip for the last few days, no cough or fever      Physical Exam   Triage Vital Signs: ED Triage Vitals  Enc Vitals Group     BP 09/14/22 2057 (!) 154/92     Pulse Rate 09/14/22 2057 75     Resp 09/14/22 2057 (!) 21     Temp 09/14/22 2057 98.5 F (36.9 C)     Temp Source 09/14/22 2057 Oral     SpO2 09/14/22 2057 100 %     Weight 09/14/22 2056 281 lb (127.5 kg)     Height 09/14/22 2056 5\' 2"  (1.575 m)     Head Circumference --      Peak Flow --      Pain Score 09/14/22 2056 0     Pain Loc --      Pain Edu? --      Excl. in [place]? --     Most recent vital signs: Vitals:   09/14/22 2057  BP: (!) 154/92  Pulse: 75  Resp: (!) 21  Temp: 98.5 F (36.9 C)  SpO2: 100%    {Only need to document appropriate and relevant physical exam:1} General: Awake, no distress. *** CV:  Good peripheral perfusion. *** Resp:  Normal effort. *** Abd:  No distention. *** Other:  ***   ED Results / Procedures / Treatments   Labs (all labs ordered are listed, but only abnormal results are displayed) Labs Reviewed  GROUP A STREP BY PCR  RESP PANEL BY RT-PCR (FLU A&B, COVID) [id number]     EKG  ***   RADIOLOGY *** {USE THE WORD "INTERPRETED"!! You MUST document your own interpretation of imaging, as well as the fact that you reviewed the radiologist's report!:1}   PROCEDURES:  Critical Care performed: {CriticalCareYesNo:19197::"Yes, see critical care procedure note(s)","No"}  Procedures   MEDICATIONS ORDERED IN ED: Medications  pseudoephedrine (SUDAFED) tablet 60 mg (has no  administration in time range)  fluticasone (FLONASE) 50 MCG/ACT nasal spray 2 spray (has no administration in time range)     IMPRESSION / MDM / ASSESSMENT AND PLAN / ED COURSE  I reviewed the triage vital signs and the nursing notes.                              Differential diagnosis includes, but is not limited to, ***  Patient's presentation is most consistent with {EM COPA:27473}  *** {If the patient is on the monitor, remove the brackets and asterisks on the sentence below and remember to document it as a Procedure as well. Otherwise delete the sentence below:1} {**The patient is on the cardiac monitor to evaluate for evidence of arrhythmia and/or significant heart rate changes.**} {Remember to include, when applicable, any/all of the following data: independent review of imaging independent review of labs (comment specifically on pertinent positives and negatives) review of specific prior hospitalizations, PCP/specialist notes, etc. discuss meds given and prescribed document any discussion with consultants (including hospitalists) any clinical decision tools you used and why (PECARN, NEXUS, etc.) did you consider  admitting the patient? document social determinants of health affecting patient's care (homelessness, inability to follow up in a timely fashion, etc) document any pre-existing conditions increasing risk on current visit (e.g. diabetes and HTN increasing danger of high-risk chest pain/ACS) describes what meds you gave (especially parenteral) and why any other interventions?:1}     FINAL CLINICAL IMPRESSION(S) / ED DIAGNOSES   Final diagnoses:  None     Rx / DC Orders   ED Discharge Orders     None        Note:  This document was prepared using Dragon voice recognition software and may include unintentional dictation errors.

## 2022-12-12 ENCOUNTER — Ambulatory Visit (INDEPENDENT_AMBULATORY_CARE_PROVIDER_SITE_OTHER): Payer: Medicaid Other | Admitting: Licensed Practical Nurse

## 2022-12-12 ENCOUNTER — Other Ambulatory Visit (HOSPITAL_COMMUNITY)
Admission: RE | Admit: 2022-12-12 | Discharge: 2022-12-12 | Disposition: A | Discharge status: Home / Self Care | Payer: Medicaid Other | Source: Ambulatory Visit | Attending: Licensed Practical Nurse | Admitting: Licensed Practical Nurse

## 2022-12-12 ENCOUNTER — Encounter: Payer: Self-pay | Admitting: Licensed Practical Nurse

## 2022-12-12 VITALS — BP 122/73 | HR 79 | Ht 62.0 in | Wt 295.1 lb

## 2022-12-12 DIAGNOSIS — Z01419 Encounter for gynecological examination (general) (routine) without abnormal findings: Secondary | ICD-10-CM | POA: Diagnosis present

## 2022-12-12 DIAGNOSIS — E039 Hypothyroidism, unspecified: Secondary | ICD-10-CM

## 2022-12-12 DIAGNOSIS — Z124 Encounter for screening for malignant neoplasm of cervix: Secondary | ICD-10-CM | POA: Insufficient documentation

## 2022-12-12 DIAGNOSIS — E1169 Type 2 diabetes mellitus with other specified complication: Secondary | ICD-10-CM

## 2022-12-12 DIAGNOSIS — N912 Amenorrhea, unspecified: Secondary | ICD-10-CM

## 2022-12-12 NOTE — Progress Notes (Signed)
Gynecology Annual Exam  PCP: Hubbard Hartshorn, FNP  Chief Complaint:  Chief Complaint  Patient presents with   Gynecologic Exam    History of Present Illness: Patient is a 27 y.o. G0P0000 presents for annual exam.  Here with her husband today. She has been without insurance for a number of years so has not seen a provider in years. Currently she and her husband are trying to conceive. They have been trying for a number of years, she uses an app, when asked about signs of ovulation she states she has hot flashes and mood swings. She was previously told that she needs to lose weight in order to conceive. Aside from being busy at work, she does not exercise. She has been told that she has PCOS, she is on Metformin for the treatment of diabetes, she only checks her sugars "when I need too". She does not check her sugars daily. Denies alcohol, tobacco or illicit drug use.    LMP: Patient's last menstrual period was 10/12/2022 (exact date).  Average Interval: irregular, Pt stats they occur about once a month, but has not had a cycle since December 30. UPT's have been negative  Duration of flow:  5-7  days Heavy Menses: yes, needs to change pad every 2 hours  or wear depends  Clots: no Intermenstrual Bleeding: no Postcoital Bleeding: no Dysmenorrhea: occasional   The patient is sexually active with 1 female partner.  She currently uses none for contraception. She denies dyspareunia.  The patient does not perform self breast exams.  There is no notable family history of breast or ovarian cancer in her family.  The patient wears seatbelts: yes.  The patient has regular exercise: no.    The patient denies current symptoms of depression.  Hx of depression noted in chart  Works at [employer] with her husband, husband present unable to ask about IPV.  Does not have a PCP Wears glasses, has not had an exam in years Dental exam has been years    Review of Systems: Review of Systems   Constitutional:  Positive for malaise/fatigue.  Respiratory:  Positive for shortness of breath.   Gastrointestinal:  Positive for nausea and vomiting.  Genitourinary:  Positive for frequency.  Neurological:  Positive for dizziness and headaches.  Endo/Heme/Allergies:  Positive for environmental allergies.       Hot flashes, cold intolerance   Psychiatric/Behavioral:  The patient is nervous/anxious.     Past Medical History:  Patient Active Problem List   Diagnosis Date Noted   Microalbuminuria due to type 2 diabetes mellitus ([place]) 01/04/2020   Extrinsic asthma 01/04/2020   Seasonal allergic rhinitis due to pollen 01/04/2020   Reactive depression 01/04/2020   Hyperlipidemia associated with type 2 diabetes mellitus ([place]) 05/25/2019   Essential hypertension 05/25/2019   Acquired hypothyroidism 05/25/2019   Elevated transaminase level 05/25/2019   Irregular uterine bleeding 12/21/2018   Abnormal thyroid blood test 12/21/2018   PCO (polycystic ovaries) 12/10/2016   Vaginal discharge 11/21/2016   Amenorrhea 07/18/2016   Morbid obesity with BMI of 60.0-69.9, adult ([place]) 07/18/2016   Mentally challenged 07/18/2016   Family history of breast cancer in mother 07/18/2016    Past Surgical History:  Past Surgical History:  Procedure Laterality Date   CYSTOSCOPY W/ URETERAL STENT PLACEMENT Left 04/04/2022   Procedure: CYSTOSCOPY WITH RETROGRADE PYELOGRAM/URETERAL STENT PLACEMENT;  Surgeon: Hollice Espy, MD;  Location: [facility] ORS;  Service: Urology;  Laterality: Left;   CYSTOSCOPY/URETEROSCOPY/HOLMIUM LASER/STENT PLACEMENT  Left 04/22/2022   Procedure: CYSTOSCOPY/URETEROSCOPY/HOLMIUM LASER/STENT EXCHANGE;  Surgeon: Hollice Espy, MD;  Location: [facility] ORS;  Service: Urology;  Laterality: Left;   CYSTOSCOPY/URETEROSCOPY/HOLMIUM LASER/STENT PLACEMENT Left 05/06/2022   Procedure: CYSTOSCOPY/URETEROSCOPY/HOLMIUM LASER/STENT EXCHANGE;  Surgeon: Hollice Espy, MD;  Location: [facility] ORS;  Service:  Urology;  Laterality: Left;   NO PAST SURGERIES      Gynecologic History:  Patient's last menstrual period was 10/12/2022 (exact date). Contraception: none Last Pap: Results were: 2020 no abnormalities   Obstetric History: G0P0000  Family History:  Family History  Problem Relation Age of Onset   Breast cancer Mother    Ovarian cancer Neg Hx    Colon cancer Neg Hx    Diabetes Neg Hx    Heart disease Neg Hx     Social History:  Social History   Socioeconomic History   Marital status: Married    Spouse name: [place] Steig   Number of children: 0   Years of education: Not on file   Highest education level: High school graduate  Occupational History   Occupation: unemployed    Comment: currently applying for disability  Tobacco Use   Smoking status: Never   Smokeless tobacco: Never  Vaping Use   Vaping Use: Never used  Substance and Sexual Activity   Alcohol use: Not Currently    Comment: occasional wine   Drug use: No   Sexual activity: Yes    Partners: Male    Birth control/protection: None  Other Topics Concern   Not on file  Social History Narrative   Not on file   Social Determinants of Health   Financial Resource Strain: Low Risk  (12/21/2018)   Overall Financial Resource Strain (CARDIA)    Difficulty of Paying Living Expenses: Not hard at all  Food Insecurity: No Food Insecurity (12/21/2018)   Hunger Vital Sign    Worried About Running Out of Food in the Last Year: Never true    [place] in the Last Year: Never true  Transportation Needs: No Transportation Needs (12/21/2018)   PRAPARE - Hydrologist (Medical): No    Lack of Transportation (Non-Medical): No  Physical Activity: Insufficiently Active (12/21/2018)   Exercise Vital Sign    Days of Exercise per Week: 5 days    Minutes of Exercise per Session: 10 min  Stress: No Stress Concern Present (12/21/2018)   [place]    Feeling of Stress : Not at all  Social Connections: Somewhat Isolated (12/21/2018)   Social Connection and Isolation Panel [NHANES]    Frequency of Communication with Friends and Family: More than three times a week    Frequency of Social Gatherings with Friends and Family: Never    Attends Religious Services: Never    Marine scientist or Organizations: No    Attends Archivist Meetings: Never    Marital Status: Married  Human resources officer Violence: Not At Risk (12/21/2018)   Humiliation, Afraid, Rape, and Kick questionnaire    Fear of Current or Ex-Partner: No    Emotionally Abused: No    Physically Abused: No    Sexually Abused: No    Allergies:  Allergies  Allergen Reactions   Amoxicillin     Has patient had a PCN reaction causing immediate rash, facial/tongue/throat swelling, SOB or lightheadedness with hypotension: YES Has patient had a PCN reaction causing severe rash involving mucus membranes or skin necrosis: No Has  patient had a PCN reaction that required hospitalization: No Has patient had a PCN reaction occurring within the last 10 years: No If all of the above answers are "NO", then may proceed with Cephalosporin use.      Medications: Prior to Admission medications   Medication Sig Start Date End Date Taking? Authorizing Provider  acetaminophen (TYLENOL) 500 MG tablet Take 500 mg by mouth every 6 (six) hours as needed for mild pain or moderate pain.   Yes [provider]  albuterol (VENTOLIN HFA) 108 (90 Base) MCG/ACT inhaler Inhale 2 puffs into the lungs every 6 (six) hours as needed for wheezing or shortness of breath. 01/04/20  Yes Towanda Malkin, MD  blood glucose meter kit and supplies KIT Dispense based on patient and insurance preference. Use 1-2 times daily as needed. 05/25/19  Yes Raelyn Ensign E, FNP  glucose blood test strip Use as instructed 06/16/19  Yes Hubbard Hartshorn, FNP  metFORMIN (GLUCOPHAGE) 500 MG tablet  Take 1 tablet (500 mg total) by mouth 2 (two) times daily with a meal. 04/06/19  Yes Hubbard Hartshorn, FNP  fluticasone (FLONASE) 50 MCG/ACT nasal spray Place 2 sprays into both nostrils daily for 5 days. 09/14/22 09/19/22  Arta Silence, MD  labetalol (NORMODYNE) 200 MG tablet Take 1 tablet by mouth twice daily Patient not taking: Reported on 12/12/2022 09/29/19   Homero Fellers, MD  levothyroxine (EUTHYROX) 25 MCG tablet TAKE 1 & 1/2 (ONE & ONE-HALF) TABLETS BY MOUTH ONCE DAILY BEFORE BREAKFAST (MUST  COME  FOR  LABS) Patient not taking: Reported on 12/12/2022 01/04/20   Towanda Malkin, MD  lisinopril (ZESTRIL) 10 MG tablet Take 1 tablet (10 mg total) by mouth daily. Patient not taking: Reported on 12/12/2022 01/04/20   Towanda Malkin, MD  norelgestromin-ethinyl estradiol Marilu Favre) 150-35 MCG/24HR transdermal patch Place 1 patch onto the skin once a week. Patient not taking: Reported on 12/12/2022 02/11/19   Homero Fellers, MD  oxybutynin (DITROPAN) 5 MG tablet Take 1 tablet (5 mg total) by mouth every 8 (eight) hours as needed for bladder spasms. Patient not taking: Reported on 12/12/2022 04/04/22   Hollice Espy, MD  pseudoephedrine (SUDAFED) 60 MG tablet Take 1 tablet (60 mg total) by mouth every 6 (six) hours as needed for congestion. Patient not taking: Reported on 12/12/2022 09/14/22   Arta Silence, MD  rosuvastatin (CRESTOR) 10 MG tablet Take 1 tablet (10 mg total) by mouth daily. Patient not taking: Reported on 12/12/2022 08/24/19   Hubbard Hartshorn, FNP    Physical Exam Vitals: Blood pressure 122/73, pulse 79, height '5\' 2"'$  (1.575 m), weight 295 lb 1.6 oz (133.9 kg), last menstrual period 10/12/2022.  General: NAD HEENT: normocephalic, anicteric Thyroid: no enlargement, no palpable nodules Pulmonary: No increased work of breathing, CTAB Cardiovascular: RRR, distal pulses 2+ Breast: Breast symmetrical, no tenderness, no palpable nodules or masses, no skin  or nipple retraction present, no nipple discharge.  No axillary or supraclavicular lymphadenopathy. Abdomen: NABS, soft, non-tender, non-distended.  Umbilicus without lesions.  No hepatomegaly, splenomegaly or masses palpable. No evidence of hernia  Genitourinary:  External: Normal external female genitalia.  Normal urethral meatus, normal Bartholin's and Skene's glands.    Vagina: Normal vaginal mucosa, no evidence of prolapse.    Cervix: Grossly normal in appearance, no bleeding  Uterus: Non-enlarged, mobile, normal contour.  No CMT  Adnexa: ovaries non-enlarged, no adnexal masses  Rectal: deferred  Lymphatic: no evidence of inguinal lymphadenopathy Extremities: no edema,  erythema, or tenderness Neurologic: Grossly intact Psychiatric: mood appropriate, affect full  Female chaperone present for pelvic and breast  portions of the physical exam    Assessment: 27 y.o. G0P0000 routine annual exam  Plan: Problem List Items Addressed This Visit       Other   Amenorrhea   Relevant Orders   Beta HCG, Quant   Other Visit Diagnoses     Well woman exam    -  Primary   Relevant Orders   Hemoglobin A1c   CBC   Lipid panel   TSH + free T4   Rubella screen   Varicella zoster antibody, IgG   Cytology - PAP   Beta HCG, Quant   Diabetes mellitus type 2 in obese ([place])       Relevant Orders   Hemoglobin A1c   Hypothyroid obesity       Relevant Orders   TSH + free T4   Cervical cancer screening       Relevant Orders   Cytology - PAP       1) 4) Gardasil Series discussed and if applicable offered to patient - Patient has previously completed 3 shot series   2) STI screening  wasoffered and declined  3)  ASCCP guidelines and rational discussed.  Patient opts for every 3 years screening interval  4) Contraception - the patient is currently using  none.  She is attempting to conceive in the near future We discussed safe sex practices to reduce her furture risk of STI's.    -Reviewed pt should be in her best health possible prior to becoming pregnant -Discussed losing at least 10 percent her of weight to achieve pregnancy-offered referral to Dr Trixie Rude if needed -Start PNV and Folic Acid now -Check rubella and Varicella status -Check HA1C -reviewed OPK, encouraged use daily -RTC for further discussion if needed  -Consider referral to fertility specialist if pregnancy not achieved.  -rec partner have sperm analysis done    5) No follow-ups on file.   Roberto Scales, [facility], [facility] 12/12/2022, 12:17 PM

## 2022-12-13 LAB — TSH+FREE T4
Free T4: 1.25 ng/dL (ref 0.82–1.77)
TSH: 7.02 u[IU]/mL — ABNORMAL HIGH (ref 0.450–4.500)

## 2022-12-13 LAB — CBC
Hematocrit: 38.4 % (ref 34.0–46.6)
Hemoglobin: 12.1 g/dL (ref 11.1–15.9)
MCH: 26.4 pg — ABNORMAL LOW (ref 26.6–33.0)
MCHC: 31.5 g/dL (ref 31.5–35.7)
MCV: 84 fL (ref 79–97)
Platelets: 395 10*3/uL (ref 150–450)
RBC: 4.59 x10E6/uL (ref 3.77–5.28)
RDW: 13.2 % (ref 11.7–15.4)
WBC: 8.1 10*3/uL (ref 3.4–10.8)

## 2022-12-13 LAB — HEMOGLOBIN A1C
Est. average glucose Bld gHb Est-mCnc: 134 mg/dL
Hgb A1c MFr Bld: 6.3 % — ABNORMAL HIGH (ref 4.8–5.6)

## 2022-12-13 LAB — LIPID PANEL
Chol/HDL Ratio: 6.1 ratio — ABNORMAL HIGH (ref 0.0–4.4)
Cholesterol, Total: 189 mg/dL (ref 100–199)
HDL: 31 mg/dL — ABNORMAL LOW (ref >39)
LDL Chol Calc (NIH): 117 mg/dL — ABNORMAL HIGH (ref 0–99)
Triglycerides: 232 mg/dL — ABNORMAL HIGH (ref 0–149)
VLDL Cholesterol Cal: 41 mg/dL — ABNORMAL HIGH (ref 5–40)

## 2022-12-13 LAB — VARICELLA ZOSTER ANTIBODY, IGG: Varicella zoster IgG: 486 index (ref >165)

## 2022-12-13 LAB — RUBELLA SCREEN: Rubella Antibodies, IGG: 1.59 index (ref >0.99)

## 2022-12-14 LAB — BETA HCG QUANT (REF LAB): hCG Quant: <1 m[IU]/mL

## 2022-12-14 LAB — SPECIMEN STATUS REPORT

## 2022-12-16 LAB — CYTOLOGY - PAP
Chlamydia: NEGATIVE
Comment: NEGATIVE
Comment: NEGATIVE
Comment: NORMAL
Diagnosis: NEGATIVE
High risk HPV: NEGATIVE
Neisseria Gonorrhea: NEGATIVE

## 2023-03-23 ENCOUNTER — Ambulatory Visit
Admission: EM | Admit: 2023-03-23 | Discharge: 2023-03-23 | Disposition: A | Discharge status: Home / Self Care | Payer: 59 | Attending: Physician Assistant | Admitting: Physician Assistant

## 2023-03-23 ENCOUNTER — Encounter: Payer: Self-pay | Admitting: Emergency Medicine

## 2023-03-23 DIAGNOSIS — L299 Pruritus, unspecified: Secondary | ICD-10-CM | POA: Diagnosis not present

## 2023-03-23 DIAGNOSIS — R21 Rash and other nonspecific skin eruption: Secondary | ICD-10-CM

## 2023-03-23 MED ORDER — HYDROXYZINE HCL 25 MG PO TABS: 25.0000 mg | ORAL_TABLET | Freq: Three times a day (TID) | ORAL | 1 refills | Status: AC | PRN

## 2023-03-23 MED ORDER — CLOTRIMAZOLE-BETAMETHASONE 1-0.05 % EX CREA: TOPICAL_CREAM | CUTANEOUS | 0 refills | Status: AC

## 2023-03-23 NOTE — Discharge Instructions (Signed)
-  Rash appears to be consistent with eczema and possible fungal skin infection.  I have sent a cream will treat both conditions.  Can try not to scratch.  I sent an antihistamine to the pharmacy as well.  I put a refill on it.  When you are out of this medication you may switch to over-the-counter Claritin and/or Benadryl. -Consider also Eucerin or Aveeno oatmeal eczema sensitive skin lotion. Moisturize daily. - If rash is not going away then please follow-up with PCP for allergy testing.

## 2023-03-23 NOTE — ED Triage Notes (Signed)
Patient reports itching red rash on her arms that has been off and on for 6 months.  Patient does not have PCP.

## 2023-03-23 NOTE — ED Provider Notes (Signed)
[facility]    CSN: [id number] Arrival date & time: 03/23/23  4098      History   Chief Complaint Chief Complaint  Patient presents with   Rash    HPI Leslie Allison is a 27 y.o. female presenting with her husband who helps to provide some of the history.  Patient and husband report rash on arms and itching when she is in "too much humidity." Rash has been ongoing x 6 months. She says the rash has been present only on arms everyday "when I walk out in the sun." Believes she is allergic to the sun.  She cannot identify any other triggers.  Reports sneezing and congestion over the past couple days.  No fever, cough, sore throat.  No change to detergents or body washes.  Patient has a cat, but says it is an indoor cat and does not have fleas.  Her husband does not have a similar rash and mother noticed any of her other contacts.  Has tried icing her arms, aloe and lidocaine gel. Has not tried antihistamines or hydrocortisone cream.   She denies diagnosis of eczema, but says brother has eczema. She has history of allergies and asthma.  Patient's medical history is significant for asthma, type 2 diabetes, hypertension, hypothyroidism, obesity, PCOS.  HPI  Past Medical History:  Diagnosis Date   Amenorrhea    Anxiety    Asthma    Chronic kidney disease    Diabetes mellitus without complication (HCC)    History of kidney stones    Hypertension    Hypothyroidism    PCOS (polycystic ovarian syndrome)    Thyroid disease     Patient Active Problem List   Diagnosis Date Noted   Microalbuminuria due to type 2 diabetes mellitus (HCC) 01/04/2020   Extrinsic asthma 01/04/2020   Seasonal allergic rhinitis due to pollen 01/04/2020   Reactive depression 01/04/2020   Hyperlipidemia associated with type 2 diabetes mellitus (HCC) 05/25/2019   Essential hypertension 05/25/2019   Acquired hypothyroidism 05/25/2019   Elevated transaminase level 05/25/2019   Irregular uterine  bleeding 12/21/2018   Abnormal thyroid blood test 12/21/2018   PCO (polycystic ovaries) 12/10/2016   Vaginal discharge 11/21/2016   Amenorrhea 07/18/2016   Morbid obesity with BMI of 60.0-69.9, adult (HCC) 07/18/2016   Mentally challenged 07/18/2016   Family history of breast cancer in mother 07/18/2016    Past Surgical History:  Procedure Laterality Date   CYSTOSCOPY W/ URETERAL STENT PLACEMENT Left 04/04/2022   Procedure: CYSTOSCOPY WITH RETROGRADE PYELOGRAM/URETERAL STENT PLACEMENT;  Surgeon: Vanna Scotland, MD;  Location: [facility];  Service: Urology;  Laterality: Left;   CYSTOSCOPY/URETEROSCOPY/HOLMIUM LASER/STENT PLACEMENT Left 04/22/2022   Procedure: CYSTOSCOPY/URETEROSCOPY/HOLMIUM LASER/STENT EXCHANGE;  Surgeon: Vanna Scotland, MD;  Location: [facility];  Service: Urology;  Laterality: Left;   CYSTOSCOPY/URETEROSCOPY/HOLMIUM LASER/STENT PLACEMENT Left 05/06/2022   Procedure: CYSTOSCOPY/URETEROSCOPY/HOLMIUM LASER/STENT EXCHANGE;  Surgeon: Vanna Scotland, MD;  Location: [facility];  Service: Urology;  Laterality: Left;   NO PAST SURGERIES      OB History     Gravida  0   Para  0   Term  0   Preterm  0   AB  0   Living  0      SAB  0   IAB  0   Ectopic  0   Multiple  0   Live Births  0            Home Medications    Prior to Admission medications  Medication Sig Start Date End Date Taking? Authorizing Provider  clotrimazole-betamethasone (LOTRISONE) cream Apply to affected area 2 times daily prn 03/23/23  Yes Eusebio Friendly B, PA-C  hydrOXYzine (ATARAX) 25 MG tablet Take 1 tablet (25 mg total) by mouth every 8 (eight) hours as needed for itching. 03/23/23  Yes Shirlee Latch, PA-C  acetaminophen (TYLENOL) 500 MG tablet Take 500 mg by mouth every 6 (six) hours as needed for mild pain or moderate pain.    [provider]  albuterol (VENTOLIN HFA) 108 (90 Base) MCG/ACT inhaler Inhale 2 puffs into the lungs every 6 (six) hours as needed for wheezing or  shortness of breath. 01/04/20   Jamelle Haring, MD  blood glucose meter kit and supplies KIT Dispense based on patient and insurance preference. Use 1-2 times daily as needed. 05/25/19   Doren Custard, FNP  fluticasone (FLONASE) 50 MCG/ACT nasal spray Place 2 sprays into both nostrils daily for 5 days. 09/14/22 09/19/22  Dionne Bucy, MD  glucose blood test strip Use as instructed 06/16/19   Doren Custard, FNP  labetalol (NORMODYNE) 200 MG tablet Take 1 tablet by mouth twice daily Patient not taking: Reported on 12/12/2022 09/29/19   Natale Milch, MD  levothyroxine (EUTHYROX) 25 MCG tablet TAKE 1 & 1/2 (ONE & ONE-HALF) TABLETS BY MOUTH ONCE DAILY BEFORE BREAKFAST (MUST  COME  FOR  LABS) Patient not taking: Reported on 12/12/2022 01/04/20   Jamelle Haring, MD  lisinopril (ZESTRIL) 10 MG tablet Take 1 tablet (10 mg total) by mouth daily. Patient not taking: Reported on 12/12/2022 01/04/20   Jamelle Haring, MD  metFORMIN (GLUCOPHAGE) 500 MG tablet Take 1 tablet (500 mg total) by mouth 2 (two) times daily with a meal. 04/06/19   Doren Custard, FNP  norelgestromin-ethinyl estradiol [employer]) 150-35 MCG/24HR transdermal patch Place 1 patch onto the skin once a week. Patient not taking: Reported on 12/12/2022 02/11/19   Natale Milch, MD  oxybutynin (DITROPAN) 5 MG tablet Take 1 tablet (5 mg total) by mouth every 8 (eight) hours as needed for bladder spasms. Patient not taking: Reported on 12/12/2022 04/04/22   Vanna Scotland, MD  pseudoephedrine (SUDAFED) 60 MG tablet Take 1 tablet (60 mg total) by mouth every 6 (six) hours as needed for congestion. Patient not taking: Reported on 12/12/2022 09/14/22   Dionne Bucy, MD  rosuvastatin (CRESTOR) 10 MG tablet Take 1 tablet (10 mg total) by mouth daily. Patient not taking: Reported on 12/12/2022 08/24/19   Doren Custard, FNP    Family History Family History  Problem Relation Age of Onset   Breast cancer Mother     Ovarian cancer Neg Hx    Colon cancer Neg Hx    Diabetes Neg Hx    Heart disease Neg Hx     Social History Social History   Tobacco Use   Smoking status: Never   Smokeless tobacco: Never  Vaping Use   Vaping Use: Never used  Substance Use Topics   Alcohol use: Not Currently    Comment: occasional wine   Drug use: No     Allergies   Amoxicillin   Review of Systems Review of Systems  Constitutional:  Negative for fatigue and fever.  HENT:  Positive for congestion, rhinorrhea and sneezing. Negative for facial swelling.   Respiratory:  Negative for shortness of breath.   Skin:  Positive for rash. Negative for color change.  Allergic/Immunologic: Positive for environmental allergies.  Neurological:  Negative for weakness and headaches.     Physical Exam Triage Vital Signs ED Triage Vitals  Enc Vitals Group     BP      Pulse      Resp      Temp      Temp src      SpO2      Weight      Height      Head Circumference      Peak Flow      Pain Score      Pain Loc      Pain Edu?      Excl. in GC?    No data found.  Updated Vital Signs BP 128/83 (BP Location: Right Arm)   Pulse 90   Temp 98.4 F (36.9 C) (Oral)   Resp 14   Ht 5\' 2"  (1.575 m)   Wt 295 lb 3.1 oz (133.9 kg)   SpO2 97%   BMI 53.99 kg/m      Physical Exam Vitals and nursing note reviewed.  Constitutional:      General: She is not in acute distress.    Appearance: Normal appearance. She is not ill-appearing or toxic-appearing.  HENT:     Head: Normocephalic and atraumatic.     Nose: Congestion present.     Mouth/Throat:     Mouth: Mucous membranes are moist.     Pharynx: Oropharynx is clear.  Eyes:     General: No scleral icterus.       Right eye: No discharge.        Left eye: No discharge.     Conjunctiva/sclera: Conjunctivae normal.  Cardiovascular:     Rate and Rhythm: Normal rate and regular rhythm.     Heart sounds: Normal heart sounds.  Pulmonary:     Effort: Pulmonary  effort is normal. No respiratory distress.     Breath sounds: Normal breath sounds.  Musculoskeletal:     Cervical back: Neck supple.  Skin:    General: Skin is dry.     Findings: Rash (scattered dry erythematous patches on arms with abrasions. Few areas of hypopigmentation on forearms.) present.  Neurological:     General: No focal deficit present.     Mental Status: She is alert. Mental status is at baseline.     Motor: No weakness.     Gait: Gait normal.  Psychiatric:        Mood and Affect: Mood normal.        Behavior: Behavior normal.        Thought Content: Thought content normal.      UC Treatments / Results  Labs (all labs ordered are listed, but only abnormal results are displayed) Labs Reviewed - No data to display  EKG   Radiology No results found.  Procedures Procedures (including critical care time)  Medications Ordered in UC Medications - No data to display  Initial Impression / Assessment and Plan / UC Course  I have reviewed the triage vital signs and the nursing notes.  Pertinent labs & imaging results that were available during my care of the patient were reviewed by me and considered in my medical decision making (see chart for details).   27 year old female with history of asthma, obesity, hypertension as well as diabetes presents for rash on dorsal forearms for the past 6 months.  Rash is itchy.  Not painful.  The only trigger she can identify is when she gets hot and is in  the sun.  She believes she may be allergic to the sun.  Denies any history of eczema but there is a family history of eczema and she has asthma.  Has not taken any antihistamines or use any corticosteroid topicals.  On exam her rash appears to be most consistent with eczema and possible secondary fungal skin infection.  Will treat this time with Lotrisone.  Also prescribed hydroxyzine.  Advised cool compresses.  Advised not to scratch.  Discussed importance of using moisturizer such  as Eucerin or Aveeno daily.  Advised to follow-up with PCP if rash is not improving as she might need allergy testing.   Final Clinical Impressions(s) / UC Diagnoses   Final diagnoses:  Rash and nonspecific skin eruption  Pruritus     Discharge Instructions      -Rash appears to be consistent with eczema and possible fungal skin infection.  I have sent a cream will treat both conditions.  Can try not to scratch.  I sent an antihistamine to the pharmacy as well.  I put a refill on it.  When you are out of this medication you may switch to over-the-counter Claritin and/or Benadryl. -Consider also Eucerin or Aveeno oatmeal eczema sensitive skin lotion. Moisturize daily. - If rash is not going away then please follow-up with PCP for allergy testing.     ED Prescriptions     Medication Sig Dispense Auth. Provider   clotrimazole-betamethasone (LOTRISONE) cream Apply to affected area 2 times daily prn 45 g Eusebio Friendly B, PA-C   hydrOXYzine (ATARAX) 25 MG tablet Take 1 tablet (25 mg total) by mouth every 8 (eight) hours as needed for itching. 30 tablet Gareth Morgan      PDMP not reviewed this encounter.   Shirlee Latch, PA-C 03/23/23 1022

## 2023-11-03 ENCOUNTER — Emergency Department
Admission: EM | Admit: 2023-11-03 | Discharge: 2023-11-03 | Disposition: A | Discharge status: Home / Self Care | Payer: Medicaid Other | Attending: Emergency Medicine | Admitting: Emergency Medicine

## 2023-11-03 ENCOUNTER — Other Ambulatory Visit: Payer: Self-pay

## 2023-11-03 DIAGNOSIS — R059 Cough, unspecified: Secondary | ICD-10-CM | POA: Diagnosis present

## 2023-11-03 DIAGNOSIS — Z20822 Contact with and (suspected) exposure to covid-19: Secondary | ICD-10-CM | POA: Insufficient documentation

## 2023-11-03 DIAGNOSIS — I10 Essential (primary) hypertension: Secondary | ICD-10-CM | POA: Insufficient documentation

## 2023-11-03 DIAGNOSIS — J069 Acute upper respiratory infection, unspecified: Secondary | ICD-10-CM | POA: Insufficient documentation

## 2023-11-03 DIAGNOSIS — J209 Acute bronchitis, unspecified: Secondary | ICD-10-CM

## 2023-11-03 LAB — RESP PANEL BY RT-PCR (RSV, FLU A&B, COVID)  RVPGX2
Influenza A by PCR: NEGATIVE
Influenza B by PCR: NEGATIVE
Resp Syncytial Virus by PCR: NEGATIVE
SARS Coronavirus 2 by RT PCR: NEGATIVE

## 2023-11-03 MED ORDER — AZITHROMYCIN 250 MG PO TABS: ORAL_TABLET | ORAL | 0 refills | Status: AC

## 2023-11-03 NOTE — ED Triage Notes (Signed)
Pt to ED for cough x2 weeks. Husband here for same.

## 2023-11-03 NOTE — ED Provider Notes (Signed)
   [facility] Provider Note    Event Date/Time   First MD Initiated Contact with Patient 11/03/23 0740     (approximate)  History   Chief Complaint: Cough  HPI  Leslie Allison is a 28 y.o. female with a past medical history of anxiety, hypertension, presents to the emergency department for cough and congestion.  According to the patient for the past 2 weeks she has had a dry cough with congestion as well as low-grade fever.  No chest pain no vomiting no diarrhea.  Physical Exam   Triage Vital Signs: ED Triage Vitals  Encounter Vitals Group     BP 11/03/23 0740 (!) 152/106     Systolic BP Percentile --      Diastolic BP Percentile --      Pulse Rate 11/03/23 0740 91     Resp 11/03/23 0740 20     Temp 11/03/23 0740 98.9 F (37.2 C)     Temp Source 11/03/23 0740 Oral     SpO2 11/03/23 0740 96 %     Weight 11/03/23 0733 300 lb (136.1 kg)     Height 11/03/23 0733 5\' 2"  (1.575 m)     Head Circumference --      Peak Flow --      Pain Score 11/03/23 0733 0     Pain Loc --      Pain Education --      Exclude from Growth Chart --     Most recent vital signs: Vitals:   11/03/23 0740  BP: (!) 152/106  Pulse: 91  Resp: 20  Temp: 98.9 F (37.2 C)  SpO2: 96%    General: Awake, no distress.  CV:  Good peripheral perfusion.  Regular rate and rhythm  Resp:  Normal effort.  Equal breath sounds bilaterally.  No wheeze rales or rhonchi Abd:  No distention.  ED Results / Procedures / Treatments   MEDICATIONS ORDERED IN ED: Medications - No data to display   IMPRESSION / MDM / ASSESSMENT AND PLAN / ED COURSE  I reviewed the triage vital signs and the nursing notes.  Patient's presentation is most consistent with acute illness / injury with system symptoms.  Patient presents to the emergency department with cough and congestion ongoing for the past 2 weeks.  Also states low-grade/subjective fever at times.  Overall the patient appears well, reassuring  vital signs, reassuring physical exam and lung sounds.  We will swab for COVID/flu/RSV, reassess.  Patient agreeable to plan.  Patient's respiratory panel is negative.  Will discharge with a course of Zithromax to cover for acute bronchitis.  Patient agreeable to plan.  FINAL CLINICAL IMPRESSION(S) / ED DIAGNOSES   Upper respiratory infection    Note:  This document was prepared using Dragon voice recognition software and may include unintentional dictation errors.   Minna Antis, MD 11/03/23 0900

## 2023-12-10 ENCOUNTER — Emergency Department
Admission: EM | Admit: 2023-12-10 | Discharge: 2023-12-10 | Disposition: A | Discharge status: Home / Self Care | Payer: 59 | Attending: Emergency Medicine | Admitting: Emergency Medicine

## 2023-12-10 ENCOUNTER — Emergency Department: Payer: 59

## 2023-12-10 ENCOUNTER — Other Ambulatory Visit: Payer: Self-pay

## 2023-12-10 ENCOUNTER — Encounter: Payer: Self-pay | Admitting: Emergency Medicine

## 2023-12-10 DIAGNOSIS — B974 Respiratory syncytial virus as the cause of diseases classified elsewhere: Secondary | ICD-10-CM | POA: Insufficient documentation

## 2023-12-10 DIAGNOSIS — B338 Other specified viral diseases: Secondary | ICD-10-CM

## 2023-12-10 DIAGNOSIS — N3000 Acute cystitis without hematuria: Secondary | ICD-10-CM | POA: Diagnosis not present

## 2023-12-10 DIAGNOSIS — E1165 Type 2 diabetes mellitus with hyperglycemia: Secondary | ICD-10-CM | POA: Insufficient documentation

## 2023-12-10 DIAGNOSIS — R300 Dysuria: Secondary | ICD-10-CM | POA: Diagnosis present

## 2023-12-10 DIAGNOSIS — R55 Syncope and collapse: Secondary | ICD-10-CM | POA: Diagnosis not present

## 2023-12-10 DIAGNOSIS — Z7984 Long term (current) use of oral hypoglycemic drugs: Secondary | ICD-10-CM | POA: Diagnosis not present

## 2023-12-10 LAB — CBC
HCT: 39.4 % (ref 36.0–46.0)
Hemoglobin: 12.9 g/dL (ref 12.0–15.0)
MCH: 28.1 pg (ref 26.0–34.0)
MCHC: 32.7 g/dL (ref 30.0–36.0)
MCV: 85.8 fL (ref 80.0–100.0)
Platelets: 308 10*3/uL (ref 150–400)
RBC: 4.59 MIL/uL (ref 3.87–5.11)
RDW: 12.5 % (ref 11.5–15.5)
WBC: 12.2 10*3/uL — ABNORMAL HIGH (ref 4.0–10.5)
nRBC: 0 % (ref 0.0–0.2)

## 2023-12-10 LAB — URINALYSIS, ROUTINE W REFLEX MICROSCOPIC
Bilirubin Urine: NEGATIVE
Glucose, UA: 50 mg/dL — AB
Ketones, ur: NEGATIVE mg/dL
Nitrite: POSITIVE — AB
Protein, ur: 30 mg/dL — AB
Specific Gravity, Urine: 1.024 (ref 1.005–1.030)
WBC, UA: >50 WBC/hpf (ref 0–5)
pH: 5 (ref 5.0–8.0)

## 2023-12-10 LAB — TROPONIN I (HIGH SENSITIVITY)
Troponin I (High Sensitivity): 6 ng/L (ref <18)
Troponin I (High Sensitivity): 5 ng/L (ref <18)

## 2023-12-10 LAB — RESP PANEL BY RT-PCR (RSV, FLU A&B, COVID)  RVPGX2
Influenza A by PCR: NEGATIVE
Influenza B by PCR: NEGATIVE
Resp Syncytial Virus by PCR: POSITIVE — AB
SARS Coronavirus 2 by RT PCR: NEGATIVE

## 2023-12-10 LAB — BASIC METABOLIC PANEL
Anion gap: 10 (ref 5–15)
BUN: 14 mg/dL (ref 6–20)
CO2: 25 mmol/L (ref 22–32)
Calcium: 8.7 mg/dL — ABNORMAL LOW (ref 8.9–10.3)
Chloride: 100 mmol/L (ref 98–111)
Creatinine, Ser: 1.03 mg/dL — ABNORMAL HIGH (ref 0.44–1.00)
GFR, Estimated: >60 mL/min (ref >60)
Glucose, Bld: 238 mg/dL — ABNORMAL HIGH (ref 70–99)
Potassium: 4.1 mmol/L (ref 3.5–5.1)
Sodium: 135 mmol/L (ref 135–145)

## 2023-12-10 LAB — POC URINE PREG, ED: Preg Test, Ur: NEGATIVE

## 2023-12-10 MED ORDER — CEPHALEXIN 500 MG PO CAPS
500.0000 mg | ORAL_CAPSULE | Freq: Once | ORAL | Status: AC
  Administered 2023-12-10: 500 mg via ORAL
  Filled 2023-12-10: qty 1

## 2023-12-10 MED ORDER — CEPHALEXIN 500 MG PO CAPS
500.0000 mg | ORAL_CAPSULE | Freq: Three times a day (TID) | ORAL | 0 refills | Status: AC
Start: 2023-12-10 — End: 2023-12-16

## 2023-12-10 MED ORDER — ONDANSETRON 4 MG PO TBDP: 4.0000 mg | ORAL_TABLET | Freq: Three times a day (TID) | ORAL | 0 refills | Status: AC | PRN

## 2023-12-10 MED ORDER — ACETAMINOPHEN 500 MG PO TABS
1000.0000 mg | ORAL_TABLET | Freq: Once | ORAL | Status: AC
Start: 2023-12-10 — End: 2023-12-10
  Administered 2023-12-10: 1000 mg via ORAL
  Filled 2023-12-10: qty 2

## 2023-12-10 NOTE — ED Provider Notes (Signed)
 [facility] Provider Note    Event Date/Time   First MD Initiated Contact with Patient 12/10/23 2012     (approximate)   History   Loss of Consciousness   HPI  Leslie Allison is a 28 y.o. female who presents to the ED for evaluation of Loss of Consciousness   I review a well woman OB/GYN clinic visit from 1 year ago.  G0 morbidly obese with BMI of about 60.  DM on metformin.  No documented contraception or estrogen supplementation  Patient presents to the ED due to 1-2 weeks of cough, congestion, nausea and dysuria.  Reports today she stood up quickly to rush to the bathroom due to some urinary frequency as well and reports feeling dizziness and had a syncopal episode.  This happened about 12 hours prior to my evaluation.  She did take care of family members prior to presenting for this.  Had a low-grade temperature and she did not know this, no reported fevers, chest pain, abdominal pain   Physical Exam   Triage Vital Signs: ED Triage Vitals [12/10/23 1820]  Encounter Vitals Group     BP      Systolic BP Percentile      Diastolic BP Percentile      Pulse      Resp      Temp      Temp src      SpO2      Weight (!) 325 lb (147.4 kg)     Height 5\' 2"  (1.575 m)     Head Circumference      Peak Flow      Pain Score 0     Pain Loc      Pain Education      Exclude from Growth Chart     Most recent vital signs: Vitals:   12/10/23 2100 12/10/23 2130  BP: 107/84   Pulse: (!) 109 (!) 103  Resp:    Temp:  99.4 F (37.4 C)  SpO2: 98% 97%    General: Awake, no distress.  Morbidly obese, ambulatory, laughing and making jokes, occasional coughing CV:  Good peripheral perfusion.  Tachycardic and regular Resp:  Normal effort.  Abd:  No distention.  Soft and benign MSK:  No deformity noted.  Neuro:  No focal deficits appreciated. Other:     ED Results / Procedures / Treatments   Labs (all labs ordered are listed, but only abnormal results are  displayed) Labs Reviewed  RESP PANEL BY RT-PCR (RSV, FLU A&B, COVID)  RVPGX2 - Abnormal; Notable for the following components:      Result Value   Resp Syncytial Virus by PCR POSITIVE (*)    All other components within normal limits  BASIC METABOLIC PANEL - Abnormal; Notable for the following components:   Glucose, Bld 238 (*)    Creatinine, Ser 1.03 (*)    Calcium 8.7 (*)    All other components within normal limits  CBC - Abnormal; Notable for the following components:   WBC 12.2 (*)    All other components within normal limits  URINALYSIS, ROUTINE W REFLEX MICROSCOPIC - Abnormal; Notable for the following components:   Color, Urine YELLOW (*)    APPearance HAZY (*)    Glucose, UA 50 (*)    Hgb urine dipstick MODERATE (*)    Protein, ur 30 (*)    Nitrite POSITIVE (*)    Leukocytes,Ua TRACE (*)    Bacteria, UA MANY (*)  All other components within normal limits  URINE CULTURE  CBG MONITORING, ED  POC URINE PREG, ED  POC URINE PREG, ED  TROPONIN I (HIGH SENSITIVITY)  TROPONIN I (HIGH SENSITIVITY)    EKG Sinus tachycardia with rate of 103 bpm.  Normal axis and intervals.  No clear signs of acute ischemia.  RADIOLOGY CXR interpreted by me without evidence of acute cardiopulmonary pathology.  Official radiology report(s): DG Chest 2 View Result Date: 12/10/2023 CLINICAL DATA:  Cough and fever EXAM: CHEST - 2 VIEW COMPARISON:  10/24/2019 FINDINGS: The heart size and mediastinal contours are within normal limits. Both lungs are clear. The visualized skeletal structures are unremarkable. IMPRESSION: No active cardiopulmonary disease. Electronically Signed   By: Alcide Clever M.D.   On: 12/10/2023 20:58    PROCEDURES and INTERVENTIONS:  .1-3 Lead EKG Interpretation  Performed by: Delton Prairie, MD Authorized by: Delton Prairie, MD     Interpretation: abnormal     ECG rate:  103   ECG rate assessment: tachycardic     Rhythm: sinus tachycardia     Ectopy: none      Conduction: normal   .Critical Care  Performed by: Delton Prairie, MD Authorized by: Delton Prairie, MD   Critical care provider statement:    Critical care time (minutes):  30   Critical care time was exclusive of:  Separately billable procedures and treating other patients   Critical care was necessary to treat or prevent imminent or life-threatening deterioration of the following conditions:  Sepsis   Critical care was time spent personally by me on the following activities:  Development of treatment plan with patient or surrogate, discussions with consultants, evaluation of patient's response to treatment, examination of patient, ordering and review of laboratory studies, ordering and review of radiographic studies, ordering and performing treatments and interventions, pulse oximetry, re-evaluation of patient's condition and review of old charts   Medications  acetaminophen (TYLENOL) tablet 1,000 mg (1,000 mg Oral Given 12/10/23 2034)  cephALEXin (KEFLEX) capsule 500 mg (500 mg Oral Given 12/10/23 2130)     IMPRESSION / MDM / ASSESSMENT AND PLAN / ED COURSE  I reviewed the triage vital signs and the nursing notes.  Differential diagnosis includes, but is not limited to, pneumonia, sepsis, UTI, viral URI, cardiac dysrhythmia, PE, orthostasis or vasovagal episode  {Patient presents with symptoms of an acute illness or injury that is potentially life-threatening.  Patient presents to the ED with signs of a URI test positive for RSV as well as dysuria with signs of cystitis, suitable for trial of outpatient management.  Notably with a low-grade fever and tachycardic but she look systemically well, ambulatory with no signs of shock.  Multifactorial viral and bacterial etiology but look systemically well.  Minimal leukocytosis.  Hyperglycemia without acidosis.  Urine sent for culture and signs of cystitis and also tested positive for RSV.  Started on antibiotics, discharged with this and  antiemetics.  Discussed close ED return precautions.  I considered admission for this patient  Clinical Course as of 12/10/23 2132  Wed Dec 10, 2023  2130 Reassessed and discussed workup overall.  Discussed RSV positive, UTI, antibiotics expectant management.  We discussed ED return precautions.  She is agreeable. [DS]    Clinical Course User Index [DS] Delton Prairie, MD     FINAL CLINICAL IMPRESSION(S) / ED DIAGNOSES   Final diagnoses:  Acute cystitis without hematuria  RSV infection  Vasovagal episode     Rx / DC Orders  ED Discharge Orders          Ordered    cephALEXin (KEFLEX) 500 MG capsule  3 times daily        12/10/23 2127    ondansetron (ZOFRAN-ODT) 4 MG disintegrating tablet  Every 8 hours PRN        12/10/23 2131             Note:  This document was prepared using Dragon voice recognition software and may include unintentional dictation errors.   Delton Prairie, MD 12/10/23 2132

## 2023-12-10 NOTE — ED Notes (Signed)
 Patient discharged from ED by provider. Discharge instructions reviewed with patient and all questions answered. Patient ambulatory from ED in NAD.

## 2023-12-10 NOTE — Discharge Instructions (Addendum)
 You are been discharged with a prescription for Keflex antibiotic to treat a UTI.  3 times daily for 6 more days.  Finish all 18 pills  Also tested positive for a viral respiratory infection, RSV  Please take Tylenol and ibuprofen/Advil for your pain.  It is safe to take them together, or to alternate them every few hours.  Take up to 1000mg  of Tylenol at a time, up to 4 times per day.  Do not take more than 4000 mg of Tylenol in 24 hours.  For ibuprofen, take 400-600 mg, 3 - 4 times per day.

## 2023-12-10 NOTE — ED Triage Notes (Signed)
 Pt via POV from home. States starting this AM pt having some R sided jaw pain. States also been having some dizziness nausea. Reports mid-day, states she was going to the bathroom and had a syncopal episode, reports "blacking out". Unknown how long she was unconscious just reports waking up on the ground. Pt is A&OX4 and NAD

## 2023-12-12 LAB — URINE CULTURE: Culture: >=100000 — AB

## 2023-12-31 IMAGING — CT CT ABD-PELV W/ CM
2 of 4 series · 16 of 46 positions shown, 18 images · IV contrast (APPLIED)
Comparison: None Available.

CLINICAL DATA: Left lower quadrant pain

EXAM:
CT ABDOMEN AND PELVIS WITH CONTRAST
TECHNIQUE: Multidetector CT imaging of the abdomen and pelvis was performed
using the standard protocol following bolus administration of
intravenous contrast.

[Series 2: abdomen 5.0 · axial · 0.81mm/px · z∈[-1101,-596]mm · 13 of 117 slices shown, 15 images]
[im 8/117  soft-tissue]
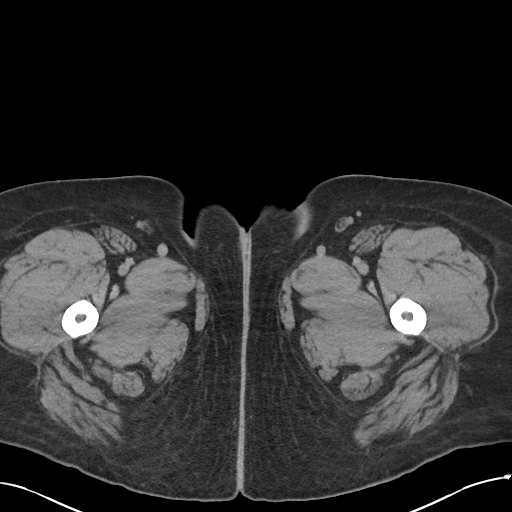
[im 8/117  bone]
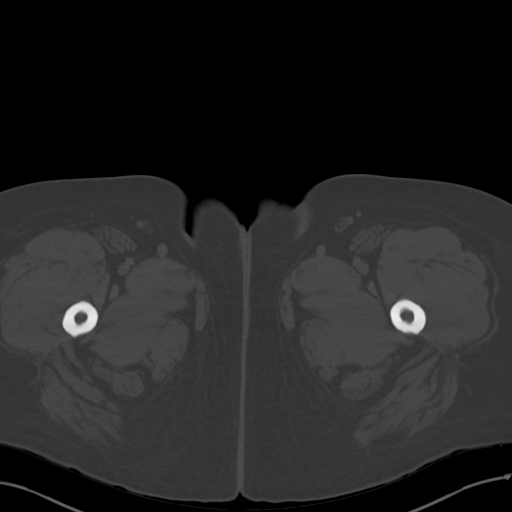
[im 16/117  soft-tissue]
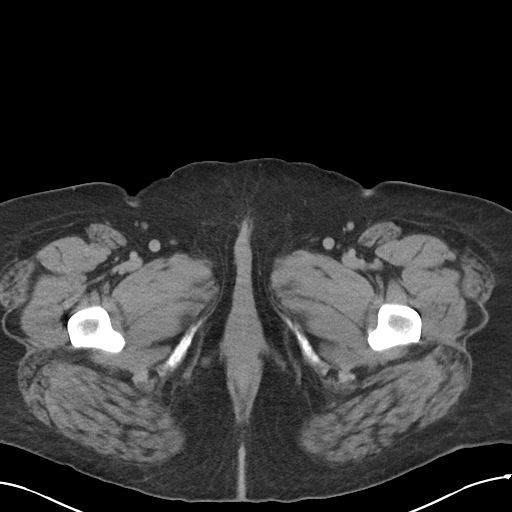
[im 24/117  soft-tissue]
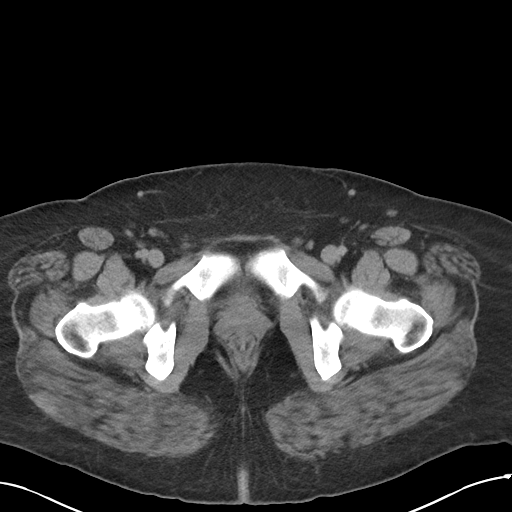
[im 31/117  soft-tissue]
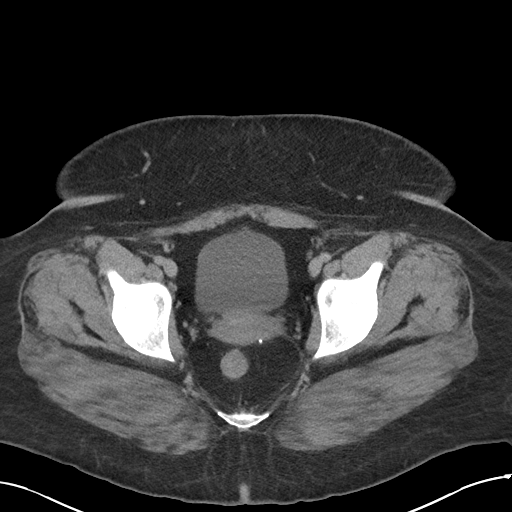
[im 39/117  soft-tissue]
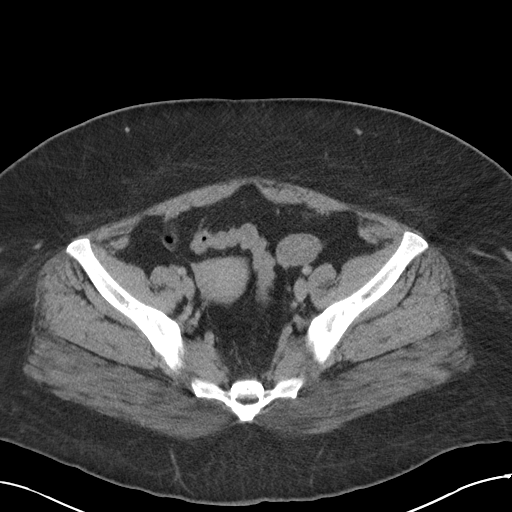
[im 47/117  soft-tissue]
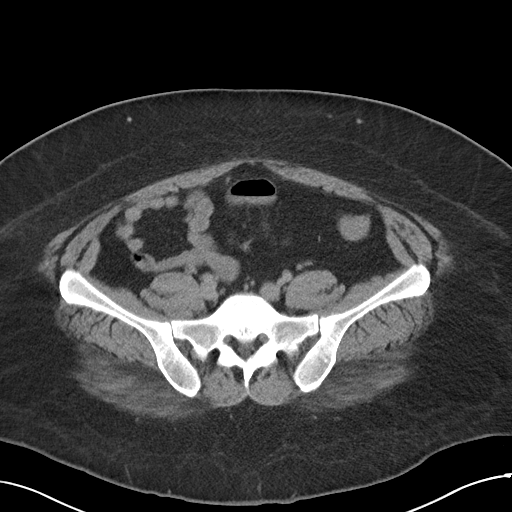
[im 62/117  soft-tissue]
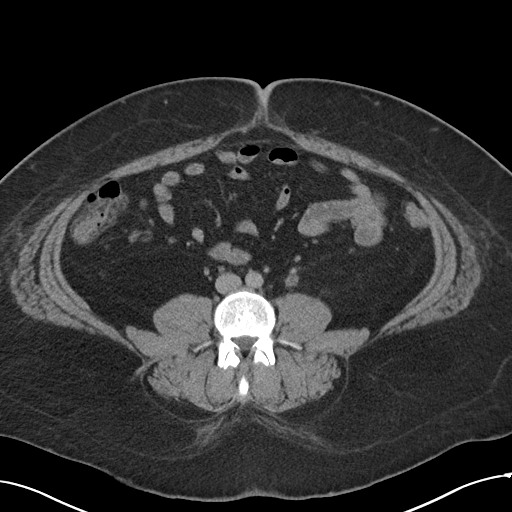
[im 70/117  soft-tissue]
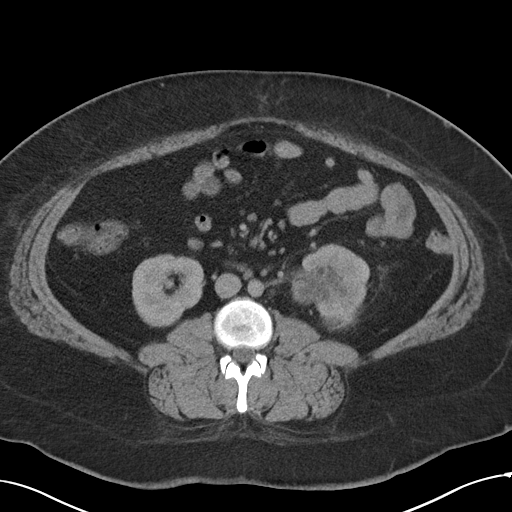
[im 78/117  soft-tissue]
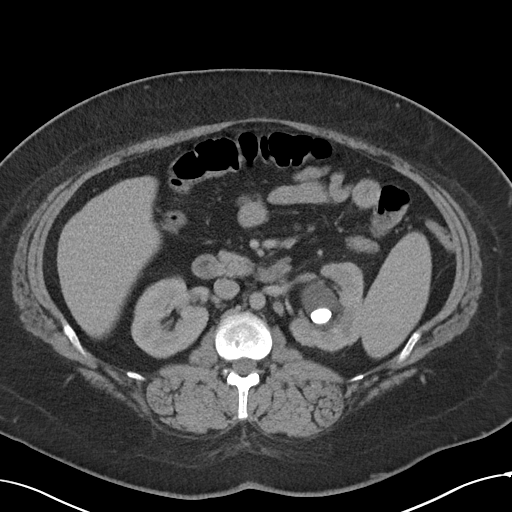
[im 78/117  bone]
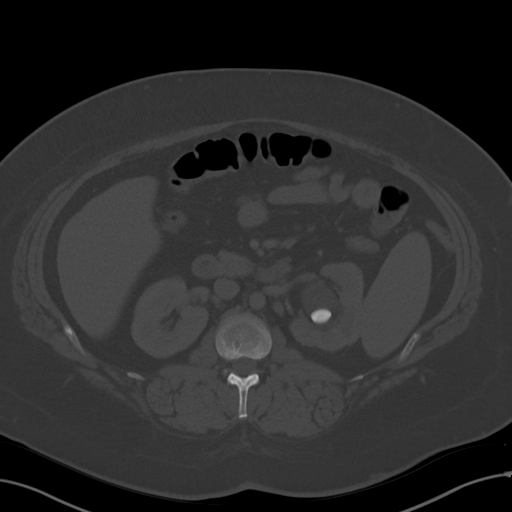
[im 86/117  soft-tissue]
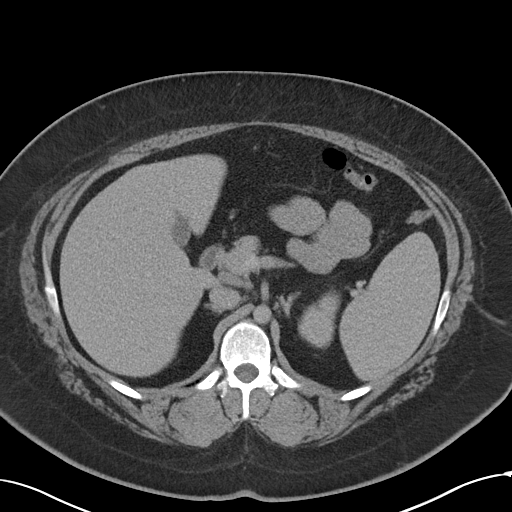
[im 93/117  soft-tissue]
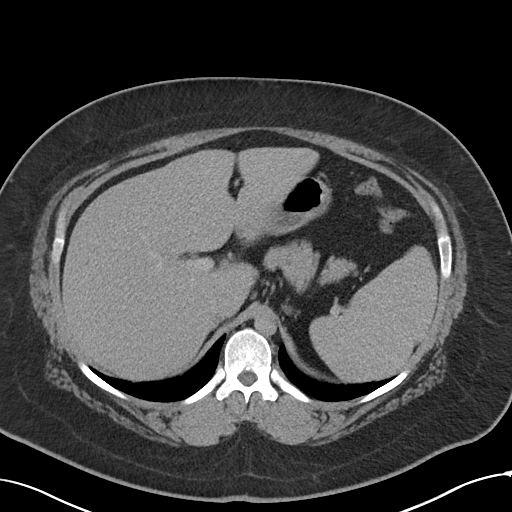
[im 101/117  soft-tissue]
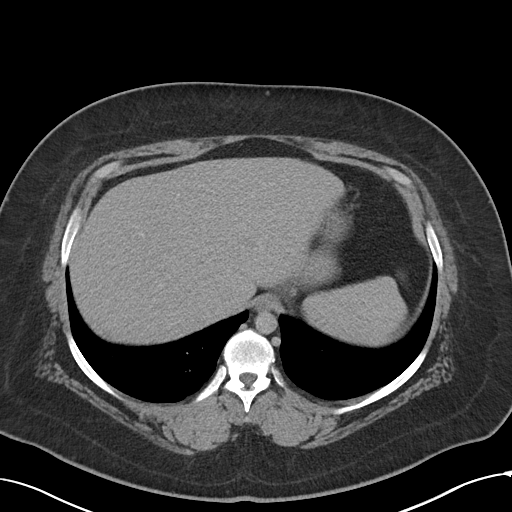
[im 109/117  soft-tissue]
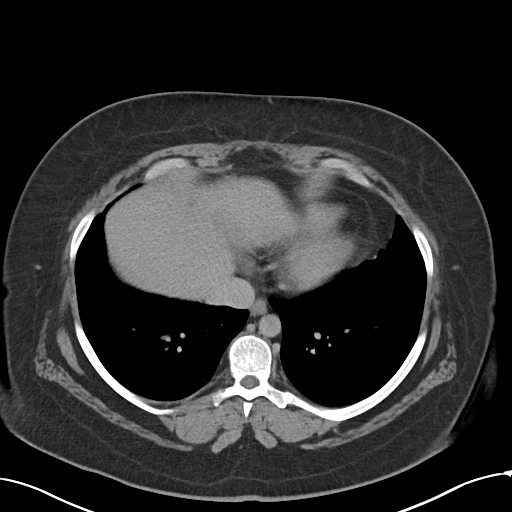

[Series 5: abdomen 3.0 mpr cor · coronal · 1.04mm/px · 3 of 111 slices shown]
[im 37/111  soft-tissue]
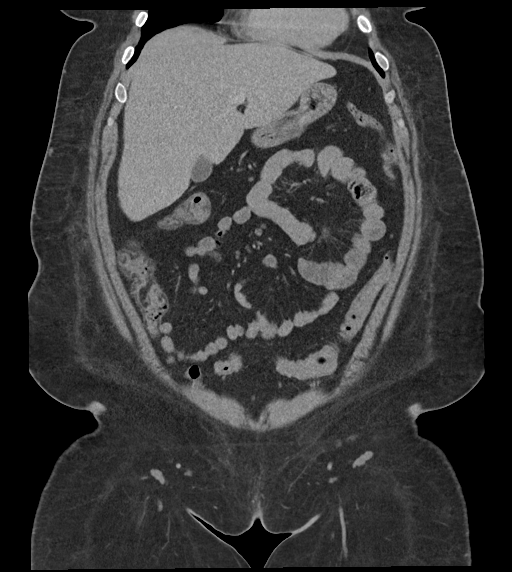
[im 49/111  soft-tissue]
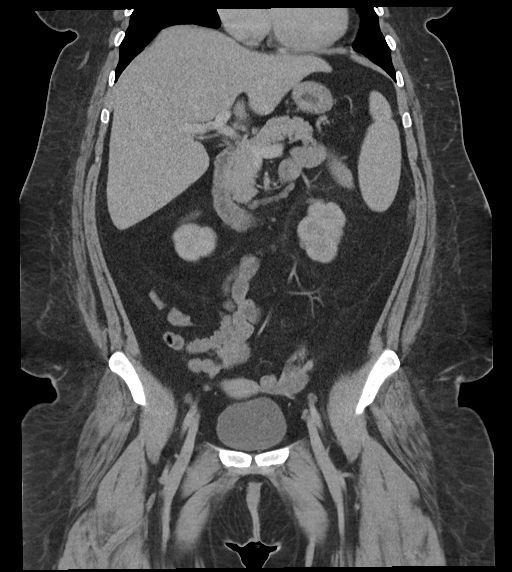
[im 62/111  soft-tissue]
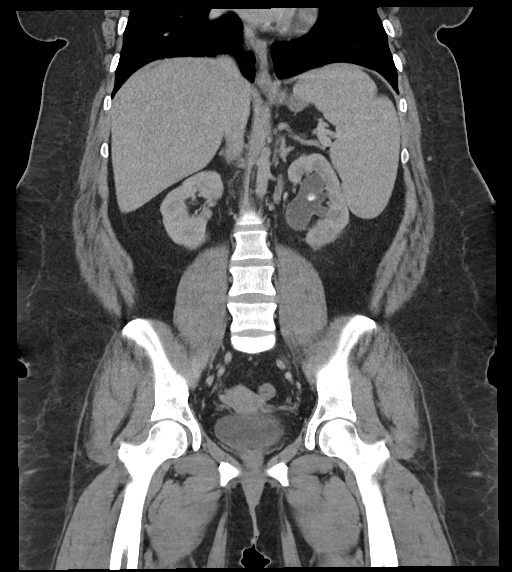

[16 of 46 positions shown; findings below may reference images not displayed]

RADIATION DOSE REDUCTION: This exam was performed according to the
departmental dose-optimization program which includes automated
exposure control, adjustment of the mA and/or kV according to
patient size and/or use of iterative reconstruction technique.

CONTRAST:  100mL OMNIPAQUE IOHEXOL 300 MG/ML  SOLN
FINDINGS: Lower chest: Lung bases demonstrate no acute consolidation or
effusion.

Hepatobiliary: No focal liver abnormality is seen. No gallstones,
gallbladder wall thickening, or biliary dilatation.

Pancreas: Unremarkable. No pancreatic ductal dilatation or
surrounding inflammatory changes.

Spleen: Normal in size without focal abnormality.

Adrenals/Urinary Tract: Adrenal glands are within normal limits.
Large stone in the left renal collecting system measuring 17 by 30
mm. Moderate left hydronephrosis with mild urothelial enhancement.
Mild left perinephric fat stranding. Left ureter does not appear to
dilated. There is mild hyperdensity within the proximal left ureter
at the level of the UPJ, coronal series 5, image 56. The distal
ureter is decompressed. The bladder is normal

Stomach/Bowel: Stomach is within normal limits. Appendix appears
normal. No evidence of bowel wall thickening, distention, or
inflammatory changes.

Vascular/Lymphatic: No significant vascular findings are present.
Multiple subcentimeter retroperitoneal lymph nodes.

Reproductive: Uterus unremarkable. The left ovary is mildly
prominent in size and asymmetric to the right.

Other: Negative for pelvic effusion or free air.

Musculoskeletal: No acute osseous abnormality
IMPRESSION: 1. Moderate left hydronephrosis with mild left perinephric stranding
and urothelial enhancement of left renal pelvis. Point of
obstruction appears to be the UPJ where there is slightly dense soft
tissue thickening of the proximal left ureter over a 2.5 cm
craniocaudal length, question hemorrhagic material or inflammatory
stricture. There is a large 30 mm left kidney stone. Urothelial
enhancement at left renal pelvis raises possibility of upper urinary
tract infection.
2. Asymmetrically prominent left ovary, suggest correlation with
pelvic ultrasound given history of left lower quadrant pain.

## 2024-07-23 ENCOUNTER — Ambulatory Visit (INDEPENDENT_AMBULATORY_CARE_PROVIDER_SITE_OTHER): Payer: Self-pay | Admitting: Licensed Practical Nurse

## 2024-07-23 VITALS — BP 145/96 | HR 70 | Ht 62.0 in | Wt 312.4 lb

## 2024-07-23 DIAGNOSIS — E0865 Diabetes mellitus due to underlying condition with hyperglycemia: Secondary | ICD-10-CM

## 2024-07-23 DIAGNOSIS — Z6841 Body Mass Index (BMI) 40.0 and over, adult: Secondary | ICD-10-CM

## 2024-07-23 DIAGNOSIS — E1169 Type 2 diabetes mellitus with other specified complication: Secondary | ICD-10-CM | POA: Diagnosis not present

## 2024-07-23 DIAGNOSIS — Z7984 Long term (current) use of oral hypoglycemic drugs: Secondary | ICD-10-CM

## 2024-07-23 DIAGNOSIS — E669 Obesity, unspecified: Secondary | ICD-10-CM | POA: Diagnosis not present

## 2024-07-23 DIAGNOSIS — Z7689 Persons encountering health services in other specified circumstances: Secondary | ICD-10-CM

## 2024-07-23 DIAGNOSIS — E1165 Type 2 diabetes mellitus with hyperglycemia: Secondary | ICD-10-CM

## 2024-07-23 DIAGNOSIS — F419 Anxiety disorder, unspecified: Secondary | ICD-10-CM | POA: Diagnosis not present

## 2024-07-23 MED ORDER — METFORMIN HCL 500 MG PO TABS: 500.0000 mg | ORAL_TABLET | Freq: Two times a day (BID) | ORAL | 0 refills | Status: AC

## 2024-07-23 MED ORDER — LABETALOL HCL 200 MG PO TABS: 200.0000 mg | ORAL_TABLET | Freq: Two times a day (BID) | ORAL | 1 refills | Status: AC

## 2024-07-23 NOTE — Progress Notes (Signed)
 Pcp, No   No chief complaint on file.   HPI:      Leslie Allison is a 28 y.o. G0P0000 whose LMP was Patient's last menstrual period was 07/02/2024 (approximate)., presents today for annual visit, Reviewed she was seen at [facility] in February for an annual exam, her insurance will not pay for another annual, asked if there were anything else she would like to discuss as her appointment stated my body and my iron. The pt has the following the concerns  Weight loss: It has been hard to lose weight she would be interested in the weight loss injections. She does regularly exercise.  -Pt has T2DM, she has not checked her blood sugars in about 1 year because her machine broke and she cannot afford a new one, she has not been able to make it back to to see a provider so she has been taking her father in laws metformin  and labetalol . She saw someone next door to our office for diabetes management.   When asked about employment pt states she is not able to work because she gets anxious around people and would like an evaluation for anxiety. Aubrea reports in certain crowds she can get anxious and then start to have symptoms like her hands shaking. When she is in stored her partner has to be with her, touching her to help stay calm. Pt reports she has been watching her niece 5 days a week, she is responsible to get her form school, this has disrupted her ability to fid a job and stresses her out as the niece is a lot to handle and her family does not pay her for her time. She wonders if she can take a medication to help anxiety.      Patient Active Problem List   Diagnosis Date Noted   Microalbuminuria due to type 2 diabetes mellitus (HCC) 01/04/2020   Extrinsic asthma 01/04/2020   Seasonal allergic rhinitis due to pollen 01/04/2020   Reactive depression 01/04/2020   Hyperlipidemia associated with type 2 diabetes mellitus (HCC) 05/25/2019   Essential hypertension 05/25/2019   Acquired hypothyroidism  05/25/2019   Elevated transaminase level 05/25/2019   Irregular uterine bleeding 12/21/2018   Abnormal thyroid  blood test 12/21/2018   PCO (polycystic ovaries) 12/10/2016   Vaginal discharge 11/21/2016   Amenorrhea 07/18/2016   Morbid obesity with BMI of 60.0-69.9, adult (HCC) 07/18/2016   Mentally challenged 07/18/2016   Family history of breast cancer in mother 07/18/2016    Past Surgical History:  Procedure Laterality Date   CYSTOSCOPY W/ URETERAL STENT PLACEMENT Left 04/04/2022   Procedure: CYSTOSCOPY WITH RETROGRADE PYELOGRAM/URETERAL STENT PLACEMENT;  Surgeon: Penne Knee, MD;  Location: [facility];  Service: Urology;  Laterality: Left;   CYSTOSCOPY/URETEROSCOPY/HOLMIUM LASER/STENT PLACEMENT Left 04/22/2022   Procedure: CYSTOSCOPY/URETEROSCOPY/HOLMIUM LASER/STENT EXCHANGE;  Surgeon: Penne Knee, MD;  Location: [facility];  Service: Urology;  Laterality: Left;   CYSTOSCOPY/URETEROSCOPY/HOLMIUM LASER/STENT PLACEMENT Left 05/06/2022   Procedure: CYSTOSCOPY/URETEROSCOPY/HOLMIUM LASER/STENT EXCHANGE;  Surgeon: Penne Knee, MD;  Location: [facility];  Service: Urology;  Laterality: Left;   NO PAST SURGERIES      Family History  Problem Relation Age of Onset   Breast cancer Mother    Ovarian cancer Neg Hx    Colon cancer Neg Hx    Diabetes Neg Hx    Heart disease Neg Hx     Social History   Socioeconomic History   Marital status: Married    Spouse name: [place] Wichman   Number  of children: 0   Years of education: Not on file   Highest education level: High school graduate  Occupational History   Occupation: unemployed    Comment: currently applying for disability  Tobacco Use   Smoking status: Never   Smokeless tobacco: Never  Vaping Use   Vaping status: Never Used  Substance and Sexual Activity   Alcohol use: Not Currently    Comment: occasional wine   Drug use: No   Sexual activity: Yes    Partners: Male    Birth control/protection: None  Other Topics Concern    Not on file  Social History Narrative   Not on file   Social Drivers of Health   Financial Resource Strain: Low Risk  (12/29/2023)   Received from [facility]   Overall Financial Resource Strain (CARDIA)    Difficulty of Paying Living Expenses: Not hard at all  Food Insecurity: No Food Insecurity (12/29/2023)   Received from [facility]   Hunger Vital Sign    Within the past 12 months, you worried that your food would run out before you got the money to buy more.: Never true    Within the past 12 months, the food you bought just didn't last and you didn't have money to get more.: Never true  Transportation Needs: No Transportation Needs (12/29/2023)   Received from [facility] - Transportation    In the past 12 months, has lack of transportation kept you from medical appointments or from getting medications?: No    Lack of Transportation (Non-Medical): No  Physical Activity: Insufficiently Active (12/21/2018)   Exercise Vital Sign    Days of Exercise per Week: 5 days    Minutes of Exercise per Session: 10 min  Stress: No Stress Concern Present (12/21/2018)   [employer] of Occupational Health - Occupational Stress Questionnaire    Feeling of Stress : Not at all  Social Connections: Somewhat Isolated (12/21/2018)   Social Connection and Isolation Panel    Frequency of Communication with Friends and Family: More than three times a week    Frequency of Social Gatherings with Friends and Family: Never    Attends Religious Services: Never    Database administrator or Organizations: No    Attends Banker Meetings: Never    Marital Status: Married  Catering manager Violence: Not At Risk (12/21/2018)   Humiliation, Afraid, Rape, and Kick questionnaire    Fear of Current or Ex-Partner: No    Emotionally Abused: No    Physically Abused: No    Sexually Abused: No    Outpatient Medications Prior to Visit   Medication Sig Dispense Refill   acetaminophen  (TYLENOL ) 500 MG tablet Take 500 mg by mouth every 6 (six) hours as needed for mild pain or moderate pain.     albuterol  (VENTOLIN  HFA) 108 (90 Base) MCG/ACT inhaler Inhale 2 puffs into the lungs every 6 (six) hours as needed for wheezing or shortness of breath. 8 g 1   blood glucose meter kit and supplies KIT Dispense based on patient and insurance preference. Use 1-2 times daily as needed. 1 each 0   clotrimazole -betamethasone  (LOTRISONE ) cream Apply to affected area 2 times daily prn 45 g 0   fluticasone  (FLONASE ) 50 MCG/ACT nasal spray Place 2 sprays into both nostrils daily for 5 days. 9.9 mL 0   glucose blood test strip Use as instructed 100 each 12   hydrOXYzine  (ATARAX )  25 MG tablet Take 1 tablet (25 mg total) by mouth every 8 (eight) hours as needed for itching. 30 tablet 1   levothyroxine  (EUTHYROX ) 25 MCG tablet TAKE 1 & 1/2 (ONE & ONE-HALF) TABLETS BY MOUTH ONCE DAILY BEFORE BREAKFAST (MUST  COME  FOR  LABS) (Patient not taking: Reported on 12/12/2022) 135 tablet 1   lisinopril  (ZESTRIL ) 10 MG tablet Take 1 tablet (10 mg total) by mouth daily. (Patient not taking: Reported on 12/12/2022) 90 tablet 3   norelgestromin -ethinyl estradiol  (XULANE) 150-35 MCG/24HR transdermal patch Place 1 patch onto the skin once a week. (Patient not taking: Reported on 12/12/2022) 3 patch 12   ondansetron  (ZOFRAN -ODT) 4 MG disintegrating tablet Take 1 tablet (4 mg total) by mouth every 8 (eight) hours as needed. 20 tablet 0   oxybutynin  (DITROPAN ) 5 MG tablet Take 1 tablet (5 mg total) by mouth every 8 (eight) hours as needed for bladder spasms. (Patient not taking: Reported on 12/12/2022) 30 tablet 0   pseudoephedrine  (SUDAFED) 60 MG tablet Take 1 tablet (60 mg total) by mouth every 6 (six) hours as needed for congestion. (Patient not taking: Reported on 12/12/2022) 30 tablet 0   rosuvastatin  (CRESTOR ) 10 MG tablet Take 1 tablet (10 mg total) by mouth daily. (Patient  not taking: Reported on 12/12/2022) 90 tablet 3   labetalol  (NORMODYNE ) 200 MG tablet Take 1 tablet by mouth twice daily (Patient not taking: Reported on 12/12/2022) 60 tablet 0   metFORMIN  (GLUCOPHAGE ) 500 MG tablet Take 1 tablet (500 mg total) by mouth 2 (two) times daily with a meal. 180 tablet 1   No facility-administered medications prior to visit.      ROS:  Review of Systems see HPI    OBJECTIVE:   Vitals:  BP (!) 145/96 (BP Location: Right Arm, Patient Position: Sitting, Cuff Size: Large)   Pulse 70   Ht 5' 2 (1.575 m)   Wt (!) 312 lb 6.4 oz (141.7 kg)   LMP 07/02/2024 (Approximate)   BMI 57.14 kg/m   Physical Exam Constitutional:      Appearance: Normal appearance.  Musculoskeletal:        General: Normal range of motion.  Neurological:     General: No focal deficit present.     Mental Status: She is alert.  Psychiatric:        Mood and Affect: Mood normal.        Thought Content: Thought content normal.   PE not done, not necessary for purpose of visit   HA1C 9.1 12/29/23   PHQ 9- 5 GAD-7 9   Results: No results found for this or any previous visit (from the past 24 hours).   Assessment/Plan: 1. Diabetes mellitus due to underlying condition, uncontrolled, with hyperglycemia (HCC) - Hemoglobin A1c - Ambulatory referral to Family Practice - Comprehensive metabolic panel with GFR - Ambulatory referral to Family Practice - Amb ref to Medical Nutrition Therapy-MNT  2. BMI 50.0-59.9, adult [facility]) - Ambulatory referral to Family Practice - Ambulatory referral to Family Practice - Amb ref to Medical Nutrition Therapy-MNT  3. Encounter to establish care - Ambulatory referral to [facility]  4. Anxiety (Primary) - Ambulatory referral to Social Work  5. BMI 60.0-69.9, adult (HCC) - labetalol  (NORMODYNE ) 200 MG tablet; Take 1 tablet (200 mg total) by mouth 2 (two) times daily.  Dispense: 60 tablet; Refill: 1  6. Type 2 diabetes mellitus in patient  with obesity (HCC) - metFORMIN  (GLUCOPHAGE ) 500 MG tablet; Take 1 tablet (  500 mg total) by mouth 2 (two) times daily with a meal.  Dispense: 180 tablet; Refill: 0     Meds ordered this encounter  Medications   labetalol  (NORMODYNE ) 200 MG tablet    Sig: Take 1 tablet (200 mg total) by mouth 2 (two) times daily.    Dispense:  60 tablet    Refill:  1   metFORMIN  (GLUCOPHAGE ) 500 MG tablet    Sig: Take 1 tablet (500 mg total) by mouth 2 (two) times daily with a meal.    Dispense:  180 tablet    Refill:  0   Discussed her physical health is of most importance. Unmanaged diabetes and hypertension can cause a lot of damage. She may benefit from the weight loss injections, how ever I am not the one to make that determination. Strongly encouraged follow up with PCP-referral placed. Metformin  and Labetalol  reordered today to carry pt over until she sees PCP.   Anxiety: encouraged regular physical activity, there are many resources online and on apps to help address your symptoms you may benefit from CBT and learning other coping mechanisms. Referral to see Alan Hail placed, encouraged pt to check Medicaid for mental health specialist. Please consider a mindfulness app.      JINNIE HERO [facility], CNM 07/23/2024 5:38 PM

## 2024-07-24 LAB — COMPREHENSIVE METABOLIC PANEL WITH GFR
ALT: 69 IU/L — ABNORMAL HIGH (ref 0–32)
AST: 42 IU/L — ABNORMAL HIGH (ref 0–40)
Albumin: 4.4 g/dL (ref 4.0–5.0)
Alkaline Phosphatase: 103 IU/L (ref 41–116)
BUN/Creatinine Ratio: 13 (ref 9–23)
BUN: 12 mg/dL (ref 6–20)
Bilirubin Total: 0.5 mg/dL (ref 0.0–1.2)
CO2: 24 mmol/L (ref 20–29)
Calcium: 9.5 mg/dL (ref 8.7–10.2)
Chloride: 98 mmol/L (ref 96–106)
Creatinine, Ser: 0.96 mg/dL (ref 0.57–1.00)
Globulin, Total: 2.9 g/dL (ref 1.5–4.5)
Glucose: 213 mg/dL — ABNORMAL HIGH (ref 70–99)
Potassium: 4.5 mmol/L (ref 3.5–5.2)
Sodium: 138 mmol/L (ref 134–144)
Total Protein: 7.3 g/dL (ref 6.0–8.5)
eGFR: 84 mL/min/1.73 (ref >59)

## 2024-07-24 LAB — HEMOGLOBIN A1C
Est. average glucose Bld gHb Est-mCnc: 171 mg/dL
Hgb A1c MFr Bld: 7.6 % — ABNORMAL HIGH (ref 4.8–5.6)

## 2024-07-29 ENCOUNTER — Ambulatory Visit: Payer: Self-pay | Admitting: Licensed Practical Nurse

## 2024-09-02 ENCOUNTER — Emergency Department
Admission: EM | Admit: 2024-09-02 | Discharge: 2024-09-02 | Disposition: A | Discharge status: Home / Self Care | Payer: Self-pay | Attending: Emergency Medicine | Admitting: Emergency Medicine

## 2024-09-02 ENCOUNTER — Emergency Department: Payer: Self-pay

## 2024-09-02 DIAGNOSIS — Z79899 Other long term (current) drug therapy: Secondary | ICD-10-CM | POA: Insufficient documentation

## 2024-09-02 DIAGNOSIS — J0110 Acute frontal sinusitis, unspecified: Secondary | ICD-10-CM | POA: Insufficient documentation

## 2024-09-02 DIAGNOSIS — J40 Bronchitis, not specified as acute or chronic: Secondary | ICD-10-CM | POA: Insufficient documentation

## 2024-09-02 LAB — CBC
HCT: 39.5 % (ref 36.0–46.0)
Hemoglobin: 13.3 g/dL (ref 12.0–15.0)
MCH: 28.4 pg (ref 26.0–34.0)
MCHC: 33.7 g/dL (ref 30.0–36.0)
MCV: 84.4 fL (ref 80.0–100.0)
Platelets: 295 K/uL (ref 150–400)
RBC: 4.68 MIL/uL (ref 3.87–5.11)
RDW: 12.4 % (ref 11.5–15.5)
WBC: 7.9 K/uL (ref 4.0–10.5)
nRBC: 0 % (ref 0.0–0.2)

## 2024-09-02 LAB — COMPREHENSIVE METABOLIC PANEL WITH GFR
ALT: 72 U/L — ABNORMAL HIGH (ref 0–44)
AST: 54 U/L — ABNORMAL HIGH (ref 15–41)
Albumin: 4.4 g/dL (ref 3.5–5.0)
Alkaline Phosphatase: 92 U/L (ref 38–126)
Anion gap: 14 (ref 5–15)
BUN: 12 mg/dL (ref 6–20)
CO2: 25 mmol/L (ref 22–32)
Calcium: 9.2 mg/dL (ref 8.9–10.3)
Chloride: 99 mmol/L (ref 98–111)
Creatinine, Ser: 1.1 mg/dL — ABNORMAL HIGH (ref 0.44–1.00)
GFR, Estimated: >60 mL/min (ref >60)
Glucose, Bld: 237 mg/dL — ABNORMAL HIGH (ref 70–99)
Potassium: 4.1 mmol/L (ref 3.5–5.1)
Sodium: 137 mmol/L (ref 135–145)
Total Bilirubin: 0.6 mg/dL (ref 0.0–1.2)
Total Protein: 7.6 g/dL (ref 6.5–8.1)

## 2024-09-02 LAB — LIPASE, BLOOD: Lipase: 43 U/L (ref 11–51)

## 2024-09-02 MED ORDER — DOXYCYCLINE HYCLATE 100 MG PO TABS: 100.0000 mg | ORAL_TABLET | Freq: Two times a day (BID) | ORAL | 0 refills | Status: AC

## 2024-09-02 MED ORDER — DOXYCYCLINE HYCLATE 100 MG PO TABS
100.0000 mg | ORAL_TABLET | Freq: Once | ORAL | Status: AC
Start: 2024-09-02 — End: 2024-09-02
  Administered 2024-09-02: 100 mg via ORAL
  Filled 2024-09-02: qty 1

## 2024-09-02 MED ORDER — HYDROCOD POLI-CHLORPHE POLI ER 10-8 MG/5ML PO SUER: 5.0000 mL | Freq: Every evening | ORAL | 0 refills | Status: AC | PRN

## 2024-09-02 NOTE — ED Provider Notes (Signed)
 [place] EMERGENCY DEPARTMENT AT [facility] Provider Note   CSN: [id number] Arrival date & time: 09/02/24  1819     Patient presents with: Emesis   Leslie Allison is a 28 y.o. female presents to the emergency department for evaluation of cough, vomiting.  She has had 2 weeks of a cough that has been dry nonproductive.  Today she was coughing so bad that she vomited.  She noticed a small streak of blood in the vomit.  She is only had 1 episode of vomiting.  She denies any chest pain, shortness of breath, difficulty swallowing.  She has had no fevers.  She has been taking DayQuil with little relief.  She does admit to having some sinus pain and pressure that has not improved over the last 2 weeks.  No history of blood clots.      Prior to Admission medications   Medication Sig Start Date End Date Taking? Authorizing Provider  chlorpheniramine-HYDROcodone (TUSSIONEX) 10-8 MG/5ML Take 5 mLs by mouth at bedtime as needed for cough. 09/02/24  Yes Charlene Debby BROCKS, PA-C  doxycycline  (VIBRA -TABS) 100 MG tablet Take 1 tablet (100 mg total) by mouth 2 (two) times daily for 10 days. 09/02/24 09/12/24 Yes Charlene Debby BROCKS, PA-C  acetaminophen  (TYLENOL ) 500 MG tablet Take 500 mg by mouth every 6 (six) hours as needed for mild pain or moderate pain.    [provider]  albuterol  (VENTOLIN  HFA) 108 (90 Base) MCG/ACT inhaler Inhale 2 puffs into the lungs every 6 (six) hours as needed for wheezing or shortness of breath. 01/04/20   Kerrin Curly BIRCH, MD  blood glucose meter kit and supplies KIT Dispense based on patient and insurance preference. Use 1-2 times daily as needed. 05/25/19   Lavina Damien BRAVO, FNP  clotrimazole -betamethasone  (LOTRISONE ) cream Apply to affected area 2 times daily prn 03/23/23   Arvis Jolan NOVAK, PA-C  fluticasone  (FLONASE ) 50 MCG/ACT nasal spray Place 2 sprays into both nostrils daily for 5 days. 09/14/22 09/19/22  Jacolyn Pae, MD  glucose blood test strip  Use as instructed 06/16/19   Lavina Damien BRAVO, FNP  hydrOXYzine  (ATARAX ) 25 MG tablet Take 1 tablet (25 mg total) by mouth every 8 (eight) hours as needed for itching. 03/23/23   Arvis Jolan NOVAK, PA-C  labetalol  (NORMODYNE ) 200 MG tablet Take 1 tablet (200 mg total) by mouth 2 (two) times daily. 07/23/24   Dominic, Jinnie Jansky, CNM  levothyroxine  (EUTHYROX ) 25 MCG tablet TAKE 1 & 1/2 (ONE & ONE-HALF) TABLETS BY MOUTH ONCE DAILY BEFORE BREAKFAST (MUST  COME  FOR  LABS) Patient not taking: Reported on 12/12/2022 01/04/20   Kerrin Curly BIRCH, MD  lisinopril  (ZESTRIL ) 10 MG tablet Take 1 tablet (10 mg total) by mouth daily. Patient not taking: Reported on 12/12/2022 01/04/20   Kerrin Curly BIRCH, MD  metFORMIN  (GLUCOPHAGE ) 500 MG tablet Take 1 tablet (500 mg total) by mouth 2 (two) times daily with a meal. 07/23/24   Dominic, Jinnie Jansky, CNM  norelgestromin -ethinyl estradiol  (XULANE) 150-35 MCG/24HR transdermal patch Place 1 patch onto the skin once a week. Patient not taking: Reported on 12/12/2022 02/11/19   Victor Claudell SAUNDERS, MD  ondansetron  (ZOFRAN -ODT) 4 MG disintegrating tablet Take 1 tablet (4 mg total) by mouth every 8 (eight) hours as needed. 12/10/23   Claudene Rover, MD  oxybutynin  (DITROPAN ) 5 MG tablet Take 1 tablet (5 mg total) by mouth every 8 (eight) hours as needed for bladder spasms. Patient not taking: Reported on 12/12/2022 04/04/22  Penne Knee, MD  pseudoephedrine  (SUDAFED) 60 MG tablet Take 1 tablet (60 mg total) by mouth every 6 (six) hours as needed for congestion. Patient not taking: Reported on 12/12/2022 09/14/22   Jacolyn Pae, MD  rosuvastatin  (CRESTOR ) 10 MG tablet Take 1 tablet (10 mg total) by mouth daily. Patient not taking: Reported on 12/12/2022 08/24/19   Lavina Damien BRAVO, FNP    Allergies: Amoxicillin    Review of Systems  Updated Vital Signs Pulse 85   Temp 98.9 F (37.2 C) (Oral)   Resp 18   Ht 5' 2 (1.575 m)   Wt 136.1 kg   LMP 08/30/2024    SpO2 96%   BMI 54.87 kg/m   Physical Exam Constitutional:      Appearance: She is well-developed.  HENT:     Head: Normocephalic and atraumatic.     Comments: Positive frontal and maxillary sinus tenderness bilaterally    Nose: Congestion and rhinorrhea present.     Mouth/Throat:     Pharynx: Oropharynx is clear. No oropharyngeal exudate or posterior oropharyngeal erythema.  Eyes:     Conjunctiva/sclera: Conjunctivae normal.  Cardiovascular:     Rate and Rhythm: Normal rate and regular rhythm.     Pulses: Normal pulses.     Heart sounds: Normal heart sounds.  Pulmonary:     Effort: Pulmonary effort is normal. No respiratory distress.     Breath sounds: Normal breath sounds. No stridor. No wheezing, rhonchi or rales.  Chest:     Chest wall: No tenderness.  Abdominal:     General: Bowel sounds are normal.     Palpations: Abdomen is soft.  Musculoskeletal:        General: Normal range of motion.     Cervical back: Normal range of motion.  Skin:    General: Skin is warm.     Capillary Refill: Capillary refill takes less than 2 seconds.     Findings: No rash.  Neurological:     General: No focal deficit present.     Mental Status: She is alert and oriented to person, place, and time. Mental status is at baseline.  Psychiatric:        Mood and Affect: Mood normal.        Behavior: Behavior normal.        Thought Content: Thought content normal.     (all labs ordered are listed, but only abnormal results are displayed) Labs Reviewed  COMPREHENSIVE METABOLIC PANEL WITH GFR - Abnormal; Notable for the following components:      Result Value   Glucose, Bld 237 (*)    Creatinine, Ser 1.10 (*)    AST 54 (*)    ALT 72 (*)    All other components within normal limits  LIPASE, BLOOD  CBC  URINALYSIS, ROUTINE W REFLEX MICROSCOPIC  POC URINE PREG, ED    EKG: None  Radiology: DG Chest 2 View Result Date: 09/02/2024 EXAM: 2 VIEW(S) XRAY OF THE CHEST 09/02/2024 06:51:20  PM COMPARISON: 12/10/2023 CLINICAL HISTORY: cough FINDINGS: LUNGS AND PLEURA: No focal pulmonary opacity. No pleural effusion. No pneumothorax. HEART AND MEDIASTINUM: No acute abnormality of the cardiac and mediastinal silhouettes. BONES AND SOFT TISSUES: No acute osseous abnormality. IMPRESSION: 1. No acute cardiopulmonary process. Electronically signed by: Morgane Naveau MD 09/02/2024 06:58 PM EST RP Workstation: [id number]     Procedures   Medications Ordered in the ED  doxycycline  (VIBRA -TABS) tablet 100 mg (has no administration in time range)  Medical Decision Making Amount and/or Complexity of Data Reviewed Labs: ordered. Radiology: ordered.  Risk Prescription drug management.   28 year old female with signs and symptoms consistent with bronchitis and sinusitis.  She has had dry persistent cough that was severe today.  No chest pain or shortness of breath.  She describes episode of posttussive vomiting x 1 with a slight streak of blood present.  Chest x-ray negative for any acute cardiopulmonary process.  Her blood work is within normal limits.  She appears well.  No lower extremity swelling or edema.  She is started on doxycycline  for sinusitis.  She is given Tussionex to take at bedtime.  Discussed possibility of prednisone  but due to her uncontrolled diabetes will hold off on this at this time.  She has albuterol  to use as needed.  Continue with DayQuil during the day.  She appears well, stable and ready for discharge. Final diagnoses:  Bronchitis  Acute non-recurrent frontal sinusitis    ED Discharge Orders          Ordered    doxycycline  (VIBRA -TABS) 100 MG tablet  2 times daily        09/02/24 1944    chlorpheniramine-HYDROcodone (TUSSIONEX) 10-8 MG/5ML  At bedtime PRN        09/02/24 1944               Lareen Mullings C, PA-C 09/02/24 1951    Arlander Charleston, MD 09/03/24 1139

## 2024-09-02 NOTE — Discharge Instructions (Signed)
 Please take medications as prescribed.  Continue with daytime cough medication.  Return to the ER for any fevers increasing pain worsening symptoms or any urgent changes in your health.

## 2024-09-02 NOTE — ED Triage Notes (Signed)
 Pt presents to the ED via POV from home. Pt reports nausea, emesis, and coughing up blood x2 weeks. Pt denies fevers or chills.

## 2024-09-08 ENCOUNTER — Encounter: Payer: Self-pay | Attending: Licensed Practical Nurse | Admitting: Dietician

## 2024-09-08 ENCOUNTER — Encounter: Payer: Self-pay | Admitting: Dietician

## 2024-09-08 VITALS — Ht 62.0 in | Wt 322.3 lb

## 2024-09-08 DIAGNOSIS — E039 Hypothyroidism, unspecified: Secondary | ICD-10-CM

## 2024-09-08 DIAGNOSIS — E1165 Type 2 diabetes mellitus with hyperglycemia: Secondary | ICD-10-CM | POA: Insufficient documentation

## 2024-09-08 DIAGNOSIS — Z713 Dietary counseling and surveillance: Secondary | ICD-10-CM | POA: Insufficient documentation

## 2024-09-08 DIAGNOSIS — E119 Type 2 diabetes mellitus without complications: Secondary | ICD-10-CM

## 2024-09-08 DIAGNOSIS — E282 Polycystic ovarian syndrome: Secondary | ICD-10-CM

## 2024-09-08 NOTE — Progress Notes (Signed)
 Diabetes Self-Management Education  Visit Type: First/Initial  Appt. Start Time: 0840 Appt. End Time: 0950  09/08/2024  [name], identified by name and date of birth, is a 28 y.o. female with a diagnosis of Diabetes: Type 2.   ASSESSMENT  Height 5' 2 (1.575 m), weight (!) 322 lb 4.8 oz (146.2 kg), last menstrual period 08/30/2024. Body mass index is 58.95 kg/m.   Diabetes Self-Management Education - 09/08/24 0853       Visit Information   Visit Type First/Initial      Initial Visit   Diabetes Type Type 2    Date Diagnosed 2020    Are you currently following a meal plan? No    Are you taking your medications as prescribed? Yes      Health Coping   How would you rate your overall health? Fair      Psychosocial Assessment   Patient Belief/Attitude about Diabetes Afraid    Self-care barriers None    Patient Concerns Nutrition/Meal planning;Weight Control;Glycemic Control    Special Needs None    Learning Readiness Ready    How often do you need to have someone help you when you read instructions, pamphlets, or other written materials from your doctor or pharmacy? 1 - Never    What is the last grade level you completed in school? 12      Pre-Education Assessment   Patient understands the diabetes disease and treatment process. Needs Instruction    Patient understands incorporating nutritional management into lifestyle. Needs Instruction    Patient undertands incorporating physical activity into lifestyle. Needs Review    Patient understands using medications safely. Comprehends key points    Patient understands monitoring blood glucose, interpreting and using results Comprehends key points    Patient understands prevention, detection, and treatment of acute complications. Needs Instruction    Patient understands prevention, detection, and treatment of chronic complications. Needs Instruction    Patient understands how to develop strategies to address psychosocial  issues. Needs Instruction    Patient understands how to develop strategies to promote health/change behavior. Needs Review      Complications   Last HgB A1C per patient/outside source 7.6 %   07/23/24   How often do you check your blood sugar? 1-2 times/day   at bedtime, husband assists   Postprandial Blood glucose range (mg/dL) 29-870;869-820;>799;819-799   120-140, 2 or more hours after eating   Number of hypoglycemic episodes per month 1    Can you tell when your blood sugar is low? Yes    What do you do if your blood sugar is low? ate pasta    Have you had a dilated eye exam in the past 12 months? No    Have you had a dental exam in the past 12 months? No    Are you checking your feet? 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     Patient Education   Previous Diabetes Education No  Healthy Eating Role of diet in the treatment of diabetes and the relationship between the three main macronutrients and blood glucose level;Plate Method;Meal timing in regards to the patients' current diabetes medication.;Meal options for control of blood glucose level and chronic complications.;Other (comment)   portion sizes using hand comparisons   Being Active Role of exercise on diabetes management, blood pressure control and cardiac health.;Helped patient identify appropriate exercises in relation to his/her diabetes, diabetes complications and other health issue.    Diabetes Stress and Support Role of stress on diabetes;Other (comment)   stress/ emotional eating     Individualized Goals  (developed by patient)   Nutrition Follow meal plan discussed    Physical Activity Exercise 3-5 times per week;15 minutes per day    Problem Solving Eating Pattern   eat a meal or snack at least every 4-5 hours during the day     Outcomes   Expected Outcomes Demonstrated interest in learning. Expect positive outcomes    Future DMSE 2 months          Individualized Plan for Diabetes Self-Management Training:   Learning Objective:  Patient will have a greater understanding of diabetes self-management. Patient education plan is to attend individual and/or group sessions per assessed needs and concerns.   Plan:   Patient Instructions  Follow a regular schedule for eating, even if eating just a few bites. Eat something every 4-5 hours throughout the day.  Check www.wire[employer]sleep.no for more recipes or help with planning balanced meals.  Drink mostly water and zero sugar or diet drinks Start slow, with any exercise, for short time span such as 10 minutes, and gradually increase as strength  Expected Outcomes:  Demonstrated interest in learning. Expect positive outcomes  Education material provided: plate planner with food lists; sample menus, snacking handout, Diabetes recipe booklet  If problems or questions, patient to contact team via:  Phone  Future DSME appointment: 2 months 10/27/24

## 2024-09-08 NOTE — Patient Instructions (Addendum)
 Follow a regular schedule for eating, even if eating just a few bites. Eat something every 4-5 hours throughout the day.  Check www.wire[employer].no for more recipes or help with planning balanced meals.  Drink mostly water and zero sugar or diet drinks Start slow, with any exercise, for short time span such as 10 minutes, and gradually increase as strength

## 2024-10-16 ENCOUNTER — Other Ambulatory Visit: Payer: Self-pay | Admitting: Licensed Practical Nurse

## 2024-10-16 DIAGNOSIS — E669 Obesity, unspecified: Secondary | ICD-10-CM

## 2024-10-27 ENCOUNTER — Encounter: Payer: Self-pay | Attending: Licensed Practical Nurse | Admitting: Dietician

## 2024-10-27 ENCOUNTER — Encounter: Payer: Self-pay | Admitting: Dietician

## 2024-10-27 VITALS — Ht 62.0 in | Wt 320.3 lb

## 2024-10-27 DIAGNOSIS — Z713 Dietary counseling and surveillance: Secondary | ICD-10-CM | POA: Insufficient documentation

## 2024-10-27 DIAGNOSIS — Z6841 Body Mass Index (BMI) 40.0 and over, adult: Secondary | ICD-10-CM | POA: Insufficient documentation

## 2024-10-27 DIAGNOSIS — E282 Polycystic ovarian syndrome: Secondary | ICD-10-CM | POA: Insufficient documentation

## 2024-10-27 DIAGNOSIS — E039 Hypothyroidism, unspecified: Secondary | ICD-10-CM | POA: Insufficient documentation

## 2024-10-27 DIAGNOSIS — E119 Type 2 diabetes mellitus without complications: Secondary | ICD-10-CM | POA: Insufficient documentation

## 2024-10-27 NOTE — Progress Notes (Signed)
 Diabetes Self-Management Education  Visit Type:  Follow-up  Appt. Start Time: 1030 Appt. End Time: 1130  10/27/2024  [name], identified by name and date of birth, is a 29 y.o. female with a diagnosis of Diabetes:  .   ASSESSMENT  Height 5' 2 (1.575 m), weight (!) 320 lb 4.8 oz (145.3 kg). Body mass index is 58.58 kg/m.    Diabetes Self-Management Education - 10/27/24 1029       Complications   How often do you check your blood sugar? 1-2 times/day    Fasting Blood glucose range (mg/dL) 869-820;819-799   839-819 per patient   Postprandial Blood glucose range (mg/dL) --   no post-meal glucose tests   Number of hypoglycemic episodes per month 0    Have you had a dilated eye exam in the past 12 months? No    Have you had a dental exam in the past 12 months? No    Are you checking your feet? No      Dietary Intake   Breakfast drinks lightly sweetened tea or water    Snack (morning) occasionally apple or bread or granola bar    Lunch sometimes skips, recently has been falling asleep. 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    Being Active Role of exercise on diabetes management, blood pressure control and cardiac health.;Helped patient identify appropriate exercises in relation to his/her diabetes, diabetes complications and other health issue.    Monitoring Identified appropriate SMBG and/or A1C goals.;Taught/discussed recording of test results and interpretation of SMBG.;Purpose and frequency of SMBG.       Individualized Goals (developed by patient)   Nutrition Follow meal plan discussed   eat a meal or snack every 3-5 hours during the day   Physical Activity Exercise 3-5 times per week          Learning Objective:  Patient will have a greater understanding of diabetes self-management. Patient education plan is to attend individual and/or group sessions per assessed needs and concerns.   Plan:   Patient Instructions  Take several deep, slow breaths whenever feeling anxious; concentrate on breathing more and more deep and slow, work up to 5 seconds to breath in, hold for 5 seconds, breathe out for 5 seconds, and hold again.  Try a breakfast drink in the mornings.  Eat a light lunch every day.  Start some regular exercise at gym with husband.    Expected Outcomes:  Demonstrated interest in learning. Expect positive outcomes  Education material provided: sample menus, snacking handout, Diabetes-Friendly Recipes and Menus  If problems or questions, patient to contact team via:  Phone  Future DSME appointment: - 2 months 12/22/24

## 2024-10-27 NOTE — Patient Instructions (Addendum)
 Take several deep, slow breaths whenever feeling anxious; concentrate on breathing more and more deep and slow, work up to 5 seconds to breath in, hold for 5 seconds, breathe out for 5 seconds, and hold again.  Try a breakfast drink in the mornings.  Eat a light lunch every day.  Start some regular exercise at gym with husband.

## 2024-12-22 ENCOUNTER — Encounter: Payer: Self-pay | Admitting: Dietician
